# Patient Record
Sex: Male | Born: 1935
Health system: Southern US, Community
[De-identification: ages and names within clinical notes are randomized; demographics above are authoritative.]

## PROBLEM LIST (undated history)

## (undated) DIAGNOSIS — K219 Gastro-esophageal reflux disease without esophagitis: Secondary | ICD-10-CM

## (undated) DIAGNOSIS — I10 Essential (primary) hypertension: Secondary | ICD-10-CM

## (undated) DIAGNOSIS — N200 Calculus of kidney: Secondary | ICD-10-CM

## (undated) DIAGNOSIS — N184 Chronic kidney disease, stage 4 (severe): Secondary | ICD-10-CM

## (undated) DIAGNOSIS — D509 Iron deficiency anemia, unspecified: Secondary | ICD-10-CM

## (undated) DIAGNOSIS — E119 Type 2 diabetes mellitus without complications: Secondary | ICD-10-CM

## (undated) DIAGNOSIS — I2510 Atherosclerotic heart disease of native coronary artery without angina pectoris: Secondary | ICD-10-CM

## (undated) DIAGNOSIS — E785 Hyperlipidemia, unspecified: Secondary | ICD-10-CM

## (undated) HISTORY — PX: CORONARY ARTERY BYPASS GRAFT: SHX141

## (undated) HISTORY — DX: Iron deficiency anemia, unspecified: D50.9

## (undated) HISTORY — DX: Chronic kidney disease, stage 4 (severe): N18.4

## (undated) HISTORY — PX: CORONARY ANGIOPLASTY WITH STENT PLACEMENT: SHX49

## (undated) HISTORY — DX: Gastro-esophageal reflux disease without esophagitis: K21.9

## (undated) HISTORY — DX: Calculus of kidney: N20.0

## (undated) HISTORY — PX: APPENDECTOMY: SHX54

## (undated) HISTORY — DX: Atherosclerotic heart disease of native coronary artery without angina pectoris: I25.10

## (undated) HISTORY — DX: Essential (primary) hypertension: I10

---

## 2003-10-11 ENCOUNTER — Other Ambulatory Visit: Payer: Self-pay

## 2007-10-07 ENCOUNTER — Ambulatory Visit: Payer: Self-pay | Admitting: Internal Medicine

## 2010-08-11 ENCOUNTER — Ambulatory Visit: Payer: Self-pay

## 2014-07-28 ENCOUNTER — Ambulatory Visit: Payer: Self-pay | Admitting: Internal Medicine

## 2015-01-19 DIAGNOSIS — Z951 Presence of aortocoronary bypass graft: Secondary | ICD-10-CM | POA: Diagnosis not present

## 2015-01-19 DIAGNOSIS — N179 Acute kidney failure, unspecified: Secondary | ICD-10-CM | POA: Diagnosis not present

## 2015-01-19 DIAGNOSIS — R109 Unspecified abdominal pain: Secondary | ICD-10-CM | POA: Diagnosis not present

## 2015-01-19 DIAGNOSIS — D649 Anemia, unspecified: Secondary | ICD-10-CM | POA: Diagnosis not present

## 2015-01-19 DIAGNOSIS — I129 Hypertensive chronic kidney disease with stage 1 through stage 4 chronic kidney disease, or unspecified chronic kidney disease: Secondary | ICD-10-CM | POA: Diagnosis not present

## 2015-01-19 DIAGNOSIS — Z791 Long term (current) use of non-steroidal anti-inflammatories (NSAID): Secondary | ICD-10-CM | POA: Diagnosis not present

## 2015-01-19 DIAGNOSIS — I071 Rheumatic tricuspid insufficiency: Secondary | ICD-10-CM | POA: Diagnosis not present

## 2015-01-19 DIAGNOSIS — N390 Urinary tract infection, site not specified: Secondary | ICD-10-CM | POA: Diagnosis not present

## 2015-01-19 DIAGNOSIS — R7989 Other specified abnormal findings of blood chemistry: Secondary | ICD-10-CM | POA: Diagnosis not present

## 2015-01-19 DIAGNOSIS — I517 Cardiomegaly: Secondary | ICD-10-CM | POA: Diagnosis not present

## 2015-01-19 DIAGNOSIS — I272 Other secondary pulmonary hypertension: Secondary | ICD-10-CM | POA: Diagnosis not present

## 2015-01-19 DIAGNOSIS — I5030 Unspecified diastolic (congestive) heart failure: Secondary | ICD-10-CM | POA: Diagnosis not present

## 2015-01-19 DIAGNOSIS — I519 Heart disease, unspecified: Secondary | ICD-10-CM | POA: Diagnosis not present

## 2015-01-19 DIAGNOSIS — R001 Bradycardia, unspecified: Secondary | ICD-10-CM | POA: Diagnosis not present

## 2015-01-19 DIAGNOSIS — D62 Acute posthemorrhagic anemia: Secondary | ICD-10-CM | POA: Diagnosis not present

## 2015-01-19 DIAGNOSIS — R079 Chest pain, unspecified: Secondary | ICD-10-CM | POA: Diagnosis not present

## 2015-01-19 DIAGNOSIS — E119 Type 2 diabetes mellitus without complications: Secondary | ICD-10-CM | POA: Diagnosis not present

## 2015-01-19 DIAGNOSIS — J90 Pleural effusion, not elsewhere classified: Secondary | ICD-10-CM | POA: Diagnosis not present

## 2015-01-19 DIAGNOSIS — R1013 Epigastric pain: Secondary | ICD-10-CM | POA: Diagnosis not present

## 2015-01-19 DIAGNOSIS — K269 Duodenal ulcer, unspecified as acute or chronic, without hemorrhage or perforation: Secondary | ICD-10-CM | POA: Diagnosis not present

## 2015-01-19 DIAGNOSIS — K267 Chronic duodenal ulcer without hemorrhage or perforation: Secondary | ICD-10-CM | POA: Diagnosis not present

## 2015-01-19 DIAGNOSIS — I2510 Atherosclerotic heart disease of native coronary artery without angina pectoris: Secondary | ICD-10-CM | POA: Diagnosis not present

## 2015-01-19 DIAGNOSIS — R531 Weakness: Secondary | ICD-10-CM | POA: Diagnosis not present

## 2015-01-19 DIAGNOSIS — K921 Melena: Secondary | ICD-10-CM | POA: Diagnosis not present

## 2015-01-19 DIAGNOSIS — I214 Non-ST elevation (NSTEMI) myocardial infarction: Secondary | ICD-10-CM | POA: Diagnosis not present

## 2015-01-19 DIAGNOSIS — R2681 Unsteadiness on feet: Secondary | ICD-10-CM | POA: Diagnosis not present

## 2015-01-19 DIAGNOSIS — K922 Gastrointestinal hemorrhage, unspecified: Secondary | ICD-10-CM | POA: Diagnosis not present

## 2015-01-26 DIAGNOSIS — I252 Old myocardial infarction: Secondary | ICD-10-CM | POA: Diagnosis not present

## 2015-01-26 DIAGNOSIS — R531 Weakness: Secondary | ICD-10-CM | POA: Diagnosis not present

## 2015-01-26 DIAGNOSIS — I2510 Atherosclerotic heart disease of native coronary artery without angina pectoris: Secondary | ICD-10-CM | POA: Diagnosis not present

## 2015-01-26 DIAGNOSIS — I10 Essential (primary) hypertension: Secondary | ICD-10-CM | POA: Diagnosis not present

## 2015-01-26 DIAGNOSIS — E119 Type 2 diabetes mellitus without complications: Secondary | ICD-10-CM | POA: Diagnosis not present

## 2015-01-26 DIAGNOSIS — Z87440 Personal history of urinary (tract) infections: Secondary | ICD-10-CM | POA: Diagnosis not present

## 2015-01-26 DIAGNOSIS — Z7982 Long term (current) use of aspirin: Secondary | ICD-10-CM | POA: Diagnosis not present

## 2015-01-26 DIAGNOSIS — D500 Iron deficiency anemia secondary to blood loss (chronic): Secondary | ICD-10-CM | POA: Diagnosis not present

## 2015-02-04 DIAGNOSIS — I872 Venous insufficiency (chronic) (peripheral): Secondary | ICD-10-CM | POA: Diagnosis not present

## 2015-02-04 DIAGNOSIS — D649 Anemia, unspecified: Secondary | ICD-10-CM | POA: Diagnosis not present

## 2015-02-04 DIAGNOSIS — K279 Peptic ulcer, site unspecified, unspecified as acute or chronic, without hemorrhage or perforation: Secondary | ICD-10-CM | POA: Diagnosis not present

## 2015-02-04 DIAGNOSIS — I8310 Varicose veins of unspecified lower extremity with inflammation: Secondary | ICD-10-CM | POA: Diagnosis not present

## 2015-02-18 DIAGNOSIS — E119 Type 2 diabetes mellitus without complications: Secondary | ICD-10-CM | POA: Diagnosis not present

## 2015-02-18 DIAGNOSIS — I509 Heart failure, unspecified: Secondary | ICD-10-CM | POA: Diagnosis not present

## 2015-02-22 DIAGNOSIS — D649 Anemia, unspecified: Secondary | ICD-10-CM | POA: Diagnosis not present

## 2015-02-22 DIAGNOSIS — I872 Venous insufficiency (chronic) (peripheral): Secondary | ICD-10-CM | POA: Diagnosis not present

## 2015-02-22 DIAGNOSIS — I8310 Varicose veins of unspecified lower extremity with inflammation: Secondary | ICD-10-CM | POA: Diagnosis not present

## 2015-02-22 DIAGNOSIS — K279 Peptic ulcer, site unspecified, unspecified as acute or chronic, without hemorrhage or perforation: Secondary | ICD-10-CM | POA: Diagnosis not present

## 2015-03-28 DIAGNOSIS — I459 Conduction disorder, unspecified: Secondary | ICD-10-CM | POA: Diagnosis not present

## 2015-03-28 DIAGNOSIS — E119 Type 2 diabetes mellitus without complications: Secondary | ICD-10-CM | POA: Diagnosis not present

## 2015-03-28 DIAGNOSIS — I25810 Atherosclerosis of coronary artery bypass graft(s) without angina pectoris: Secondary | ICD-10-CM | POA: Diagnosis not present

## 2015-04-19 DIAGNOSIS — Z0000 Encounter for general adult medical examination without abnormal findings: Secondary | ICD-10-CM | POA: Diagnosis not present

## 2015-07-20 DIAGNOSIS — I25810 Atherosclerosis of coronary artery bypass graft(s) without angina pectoris: Secondary | ICD-10-CM | POA: Diagnosis not present

## 2015-07-20 DIAGNOSIS — R21 Rash and other nonspecific skin eruption: Secondary | ICD-10-CM | POA: Diagnosis not present

## 2015-07-20 DIAGNOSIS — B0223 Postherpetic polyneuropathy: Secondary | ICD-10-CM | POA: Diagnosis not present

## 2015-10-20 DIAGNOSIS — E119 Type 2 diabetes mellitus without complications: Secondary | ICD-10-CM | POA: Diagnosis not present

## 2015-10-20 DIAGNOSIS — I25810 Atherosclerosis of coronary artery bypass graft(s) without angina pectoris: Secondary | ICD-10-CM | POA: Diagnosis not present

## 2016-01-20 DIAGNOSIS — Z882 Allergy status to sulfonamides status: Secondary | ICD-10-CM | POA: Diagnosis not present

## 2016-01-20 DIAGNOSIS — E119 Type 2 diabetes mellitus without complications: Secondary | ICD-10-CM | POA: Diagnosis not present

## 2016-01-20 DIAGNOSIS — I25810 Atherosclerosis of coronary artery bypass graft(s) without angina pectoris: Secondary | ICD-10-CM | POA: Diagnosis not present

## 2016-04-18 DIAGNOSIS — M792 Neuralgia and neuritis, unspecified: Secondary | ICD-10-CM | POA: Diagnosis not present

## 2016-04-18 DIAGNOSIS — B029 Zoster without complications: Secondary | ICD-10-CM | POA: Diagnosis not present

## 2016-04-18 DIAGNOSIS — B0221 Postherpetic geniculate ganglionitis: Secondary | ICD-10-CM | POA: Diagnosis not present

## 2016-07-25 DIAGNOSIS — Z882 Allergy status to sulfonamides status: Secondary | ICD-10-CM | POA: Diagnosis not present

## 2016-07-25 DIAGNOSIS — I459 Conduction disorder, unspecified: Secondary | ICD-10-CM | POA: Diagnosis not present

## 2016-07-25 DIAGNOSIS — I25810 Atherosclerosis of coronary artery bypass graft(s) without angina pectoris: Secondary | ICD-10-CM | POA: Diagnosis not present

## 2016-07-25 DIAGNOSIS — E119 Type 2 diabetes mellitus without complications: Secondary | ICD-10-CM | POA: Diagnosis not present

## 2016-08-04 ENCOUNTER — Emergency Department: Payer: Commercial Managed Care - HMO

## 2016-08-04 ENCOUNTER — Encounter: Payer: Self-pay | Admitting: Emergency Medicine

## 2016-08-04 ENCOUNTER — Inpatient Hospital Stay
Admission: EM | Admit: 2016-08-04 | Discharge: 2016-08-07 | DRG: 871 | Disposition: A | Discharge status: Home / Self Care | Payer: Commercial Managed Care - HMO | Attending: Specialist | Admitting: Specialist

## 2016-08-04 DIAGNOSIS — Z951 Presence of aortocoronary bypass graft: Secondary | ICD-10-CM

## 2016-08-04 DIAGNOSIS — D6959 Other secondary thrombocytopenia: Secondary | ICD-10-CM | POA: Diagnosis not present

## 2016-08-04 DIAGNOSIS — E1122 Type 2 diabetes mellitus with diabetic chronic kidney disease: Secondary | ICD-10-CM | POA: Diagnosis present

## 2016-08-04 DIAGNOSIS — E860 Dehydration: Secondary | ICD-10-CM | POA: Diagnosis not present

## 2016-08-04 DIAGNOSIS — K219 Gastro-esophageal reflux disease without esophagitis: Secondary | ICD-10-CM | POA: Diagnosis present

## 2016-08-04 DIAGNOSIS — I129 Hypertensive chronic kidney disease with stage 1 through stage 4 chronic kidney disease, or unspecified chronic kidney disease: Secondary | ICD-10-CM | POA: Diagnosis present

## 2016-08-04 DIAGNOSIS — N289 Disorder of kidney and ureter, unspecified: Secondary | ICD-10-CM

## 2016-08-04 DIAGNOSIS — D649 Anemia, unspecified: Secondary | ICD-10-CM | POA: Diagnosis present

## 2016-08-04 DIAGNOSIS — N179 Acute kidney failure, unspecified: Secondary | ICD-10-CM | POA: Diagnosis not present

## 2016-08-04 DIAGNOSIS — Z87891 Personal history of nicotine dependence: Secondary | ICD-10-CM | POA: Diagnosis not present

## 2016-08-04 DIAGNOSIS — N400 Enlarged prostate without lower urinary tract symptoms: Secondary | ICD-10-CM | POA: Diagnosis present

## 2016-08-04 DIAGNOSIS — E119 Type 2 diabetes mellitus without complications: Secondary | ICD-10-CM

## 2016-08-04 DIAGNOSIS — R739 Hyperglycemia, unspecified: Secondary | ICD-10-CM

## 2016-08-04 DIAGNOSIS — Z7982 Long term (current) use of aspirin: Secondary | ICD-10-CM

## 2016-08-04 DIAGNOSIS — N189 Chronic kidney disease, unspecified: Secondary | ICD-10-CM | POA: Diagnosis present

## 2016-08-04 DIAGNOSIS — N390 Urinary tract infection, site not specified: Secondary | ICD-10-CM | POA: Diagnosis present

## 2016-08-04 DIAGNOSIS — A419 Sepsis, unspecified organism: Secondary | ICD-10-CM | POA: Diagnosis not present

## 2016-08-04 DIAGNOSIS — N170 Acute kidney failure with tubular necrosis: Secondary | ICD-10-CM | POA: Diagnosis not present

## 2016-08-04 DIAGNOSIS — Z79899 Other long term (current) drug therapy: Secondary | ICD-10-CM | POA: Diagnosis not present

## 2016-08-04 DIAGNOSIS — Z955 Presence of coronary angioplasty implant and graft: Secondary | ICD-10-CM | POA: Diagnosis not present

## 2016-08-04 DIAGNOSIS — E785 Hyperlipidemia, unspecified: Secondary | ICD-10-CM | POA: Diagnosis present

## 2016-08-04 DIAGNOSIS — R8281 Pyuria: Secondary | ICD-10-CM | POA: Diagnosis present

## 2016-08-04 DIAGNOSIS — R6889 Other general symptoms and signs: Secondary | ICD-10-CM | POA: Diagnosis not present

## 2016-08-04 DIAGNOSIS — W19XXXA Unspecified fall, initial encounter: Secondary | ICD-10-CM | POA: Diagnosis not present

## 2016-08-04 DIAGNOSIS — R509 Fever, unspecified: Secondary | ICD-10-CM | POA: Diagnosis not present

## 2016-08-04 HISTORY — DX: Type 2 diabetes mellitus without complications: E11.9

## 2016-08-04 HISTORY — DX: Hyperlipidemia, unspecified: E78.5

## 2016-08-04 LAB — BLOOD GAS, VENOUS
Acid-base deficit: 7.6 mmol/L — ABNORMAL HIGH (ref 0.0–2.0)
BICARBONATE: 17.1 meq/L — AB (ref 21.0–28.0)
O2 SAT: 82 %
PATIENT TEMPERATURE: 37
PO2 VEN: 49 mmHg — AB (ref 31.0–45.0)
pCO2, Ven: 31 mmHg — ABNORMAL LOW (ref 44.0–60.0)
pH, Ven: 7.35 (ref 7.320–7.430)

## 2016-08-04 LAB — HEPATIC FUNCTION PANEL
ALK PHOS: 78 U/L (ref 38–126)
ALT: 9 U/L — ABNORMAL LOW (ref 17–63)
AST: 28 U/L (ref 15–41)
Albumin: 3.5 g/dL (ref 3.5–5.0)
BILIRUBIN TOTAL: 0.2 mg/dL — AB (ref 0.3–1.2)
Total Protein: 6.5 g/dL (ref 6.5–8.1)

## 2016-08-04 LAB — URINALYSIS COMPLETE WITH MICROSCOPIC (ARMC ONLY)
BILIRUBIN URINE: NEGATIVE
Glucose, UA: NEGATIVE mg/dL
KETONES UR: NEGATIVE mg/dL
NITRITE: NEGATIVE
PH: 5 (ref 5.0–8.0)
PROTEIN: 30 mg/dL — AB
Specific Gravity, Urine: 1.008 (ref 1.005–1.030)

## 2016-08-04 LAB — BASIC METABOLIC PANEL
ANION GAP: 10 (ref 5–15)
BUN: 49 mg/dL — AB (ref 6–20)
CHLORIDE: 109 mmol/L (ref 101–111)
CO2: 17 mmol/L — ABNORMAL LOW (ref 22–32)
Calcium: 8.5 mg/dL — ABNORMAL LOW (ref 8.9–10.3)
Creatinine, Ser: 3.45 mg/dL — ABNORMAL HIGH (ref 0.61–1.24)
GFR, EST AFRICAN AMERICAN: 17 mL/min — AB (ref >60)
GFR, EST NON AFRICAN AMERICAN: 14 mL/min — AB (ref >60)
Glucose, Bld: 291 mg/dL — ABNORMAL HIGH (ref 65–99)
POTASSIUM: 3.9 mmol/L (ref 3.5–5.1)
SODIUM: 136 mmol/L (ref 135–145)

## 2016-08-04 LAB — LACTIC ACID, PLASMA
LACTIC ACID, VENOUS: 4.4 mmol/L — AB (ref 0.5–1.9)
Lactic Acid, Venous: 2.5 mmol/L (ref 0.5–1.9)

## 2016-08-04 LAB — GLUCOSE, CAPILLARY: Glucose-Capillary: 328 mg/dL — ABNORMAL HIGH (ref 65–99)

## 2016-08-04 LAB — CBC
HEMATOCRIT: 26.4 % — AB (ref 40.0–52.0)
Hemoglobin: 8.8 g/dL — ABNORMAL LOW (ref 13.0–18.0)
MCH: 27.8 pg (ref 26.0–34.0)
MCHC: 33.5 g/dL (ref 32.0–36.0)
MCV: 83 fL (ref 80.0–100.0)
Platelets: 120 10*3/uL — ABNORMAL LOW (ref 150–440)
RBC: 3.18 MIL/uL — AB (ref 4.40–5.90)
RDW: 15.1 % — AB (ref 11.5–14.5)
WBC: 6.2 10*3/uL (ref 3.8–10.6)

## 2016-08-04 MED ORDER — ACETAMINOPHEN 325 MG PO TABS
ORAL_TABLET | ORAL | Status: AC
  Filled 2016-08-04: qty 2

## 2016-08-04 MED ORDER — SODIUM CHLORIDE 0.9 % IV BOLUS (SEPSIS)
250.0000 mL | Freq: Once | INTRAVENOUS | Status: AC
  Administered 2016-08-04: 250 mL via INTRAVENOUS

## 2016-08-04 MED ORDER — SODIUM CHLORIDE 0.9 % IV BOLUS (SEPSIS)
1000.0000 mL | Freq: Once | INTRAVENOUS | Status: AC
  Administered 2016-08-04: 1000 mL via INTRAVENOUS

## 2016-08-04 MED ORDER — CEFTRIAXONE SODIUM 2 G IJ SOLR
2.0000 g | Freq: Once | INTRAMUSCULAR | Status: AC
  Administered 2016-08-04: 2 g via INTRAVENOUS
  Filled 2016-08-04: qty 2

## 2016-08-04 MED ORDER — ACETAMINOPHEN 325 MG PO TABS
650.0000 mg | ORAL_TABLET | Freq: Once | ORAL | Status: AC | PRN
  Administered 2016-08-04: 650 mg via ORAL

## 2016-08-04 MED ORDER — DEXTROSE 5 % IV SOLN
2.0000 g | INTRAVENOUS | Status: DC
  Administered 2016-08-05 – 2016-08-06 (×2): 2 g via INTRAVENOUS
  Filled 2016-08-04 (×3): qty 2

## 2016-08-04 NOTE — ED Notes (Signed)
Pt denies needs at this time, family at bedside. Pt informed will recheck temp in an hour. Call bell at side. Pt placed on cardiac monitor.

## 2016-08-04 NOTE — ED Notes (Signed)
Spoke with dr. Mariea Clonts regarding pt's elevated temp, urine results, wbc results and chief complaint. No new orders received, md states will see pt next and evaluate.

## 2016-08-04 NOTE — ED Notes (Signed)
Pt and family updated on admission process. Pt denies further needs at this time. Family and pt verbalize understanding regarding admission process. Skin cooler, warm and dry. resps unlabored.

## 2016-08-04 NOTE — ED Notes (Signed)
Critical lactic acid called from lab of 4.4, dr. Mariea Clonts notified. No new orders received.

## 2016-08-04 NOTE — ED Notes (Signed)
Pt returned from ct. Pt updated on care plan. Call bell at right side, temperature improved. Pt denies further needs.

## 2016-08-04 NOTE — ED Notes (Signed)
Report from denia, rn.

## 2016-08-04 NOTE — H&P (Signed)
Jay Mathis at [facility]: Jay Mathis    MR#:  [id number]  DATE OF BIRTH:  1924-01-19  DATE OF ADMISSION:  08/04/2016  PRIMARY CARE PHYSICIAN: Jay Athens, MD   REQUESTING/REFERRING PHYSICIAN: Mariea Mathis, M.D.  CHIEF COMPLAINT:   Chief Complaint  Patient presents with  . Near Syncope    HISTORY OF PRESENT ILLNESS:  Jay Mathis  is a 82 y.o. male who presents with Weakness and near syncope. Patient states that he was using the bathroom earlier today and when he went to get up he fell and was weak. He states that he has had some low abdominal tenderness for the past couple of days. His son is present with him in the ED and states that this is very similar presentation to UTI that he's had in the past. Here in the ED he was found to have likely UTI on UA. He does state that he has had some chills recently. Hospitals were called for admission  PAST MEDICAL HISTORY:   Past Medical History:  Diagnosis Date  . Diabetes mellitus without complication ([place])   . Hyperlipidemia     PAST SURGICAL HISTORY:   Past Surgical History:  Procedure Laterality Date  . CORONARY ANGIOPLASTY WITH STENT PLACEMENT    . CORONARY ARTERY BYPASS GRAFT      SOCIAL HISTORY:   Social History  Substance Use Topics  . Smoking status: Former Research scientist (life sciences)  . Smokeless tobacco: Never Used  . Alcohol use Jay    FAMILY HISTORY:  History reviewed. Jay pertinent family history.  DRUG ALLERGIES:  Jay Known Allergies  MEDICATIONS AT HOME:   Prior to Admission medications   Medication Sig Start Date End Date Taking? Authorizing Provider  aspirin EC 81 MG tablet Take 81 mg by mouth daily.   Yes Historical Provider, MD  atorvastatin (LIPITOR) 10 MG tablet Take 10 mg by mouth at bedtime.  07/31/16  Yes Historical Provider, MD  doxazosin (CARDURA) 8 MG tablet Take 8 mg by mouth daily. 07/31/16  Yes Historical Provider, MD  furosemide (LASIX) 20 MG tablet Take 20  mg by mouth daily. 07/16/16  Yes Historical Provider, MD  glimepiride (AMARYL) 2 MG tablet Take 4 mg by mouth every evening.  07/31/16  Yes Historical Provider, MD  pantoprazole (PROTONIX) 40 MG tablet Take 40 mg by mouth 2 (two) times daily. 07/09/16  Yes Historical Provider, MD    REVIEW OF SYSTEMS:  Review of Systems  Constitutional: Positive for chills. Negative for fever, malaise/fatigue and weight loss.  HENT: Negative for ear pain, hearing loss and tinnitus.   Eyes: Negative for blurred vision, double vision, pain and redness.  Respiratory: Negative for cough, hemoptysis and shortness of breath.   Cardiovascular: Negative for chest pain, palpitations, orthopnea and leg swelling.  Gastrointestinal: Negative for abdominal pain, constipation, diarrhea, nausea and vomiting.  Genitourinary: Negative for dysuria, frequency and hematuria.       Suprapubic tenderness  Musculoskeletal: Negative for back pain, joint pain and neck pain.  Skin:       Jay acne, rash, or lesions  Neurological: Positive for weakness. Negative for dizziness, tremors and focal weakness.  Endo/Heme/Allergies: Negative for polydipsia. Does not bruise/bleed easily.  Psychiatric/Behavioral: Negative for depression. The patient is not nervous/anxious and does not have insomnia.      VITAL SIGNS:   Vitals:   08/04/16 2156 08/04/16 2200 08/04/16 2230 08/04/16 2300  BP:  (!) 134/58 126/63 (!) 128/56  Pulse:  85 70 70  Resp:  18 15 17   Temp: 98.5 F (36.9 C)     TempSrc: Oral     SpO2:  96% 96% 96%   Wt Readings from Last 3 Encounters:  Jay data found for Wt    PHYSICAL EXAMINATION:  Physical Exam  Vitals reviewed. Constitutional: He is oriented to person, place, and time. He appears well-developed and well-nourished. Jay distress.  HENT:  Head: Normocephalic and atraumatic.  Mouth/Throat: Oropharynx is clear and moist.  Eyes: Conjunctivae and EOM are normal. Pupils are equal, round, and reactive to light. Jay  scleral icterus.  Neck: Normal range of motion. Neck supple. Jay JVD present. Jay thyromegaly present.  Cardiovascular: Normal rate, regular rhythm and intact distal pulses.  Exam reveals Jay gallop and Jay friction rub.   Murmur (2/6 systolic murmur) heard. Respiratory: Effort normal and breath sounds normal. Jay respiratory distress. He has Jay wheezes. He has Jay rales.  GI: Soft. Bowel sounds are normal. He exhibits Jay distension. There is Jay tenderness.  Musculoskeletal: Normal range of motion. He exhibits Jay edema.  Jay arthritis, Jay gout  Lymphadenopathy:    He has Jay cervical adenopathy.  Neurological: He is alert and oriented to person, place, and time. Jay cranial nerve deficit.  Jay dysarthria, Jay aphasia  Skin: Skin is warm and dry. Jay rash noted. Jay erythema.  Psychiatric: He has a normal mood and affect. His behavior is normal. Judgment and thought content normal.    LABORATORY PANEL:   CBC  Recent Labs Lab 08/04/16 1832  WBC 6.2  HGB 8.8*  HCT 26.4*  PLT 120*   ------------------------------------------------------------------------------------------------------------------  Chemistries   Recent Labs Lab 08/04/16 1832  NA 136  K 3.9  CL 109  CO2 17*  GLUCOSE 291*  BUN 49*  CREATININE 3.45*  CALCIUM 8.5*  AST 28  ALT 9*  ALKPHOS 78  BILITOT 0.2*   ------------------------------------------------------------------------------------------------------------------  Cardiac Enzymes Jay results for input(s): TROPONINI in the last 168 hours. ------------------------------------------------------------------------------------------------------------------  RADIOLOGY:  Ct Renal Stone Study  Result Date: 08/04/2016 CLINICAL DATA:  Sepsis.  Fever.  The patient fell today in his room. EXAM: CT ABDOMEN AND PELVIS WITHOUT CONTRAST TECHNIQUE: Multidetector CT imaging of the abdomen and pelvis was performed following the standard protocol without IV contrast. COMPARISON:   None. FINDINGS: Lower chest:  Jay acute findings. Hepatobiliary: Normal. Pancreas: Jay mass or inflammatory process identified on this un-enhanced exam. Spleen: Within normal limits in size. Adrenals/Urinary Tract: Normal adrenal glands. Moderate bilateral renal atrophy. 13 mm hyperdense cyst on the lateral aspect of the mid left kidney. Moderate bilateral hydronephrosis. Both ureters are dilated to the bladder. Jay discrete stone or mass in the bladder. Slight thickening of the bladder wall with several bladder diverticula. Stomach/Bowel: Left inguinal hernia containing several loops of small bowel. Jay evidence of obstruction of the bowel. Scattered diverticula in the distal colon. Vascular/Lymphatic: Extensive aortic atherosclerosis. Jay adenopathy. Reproductive: Slight prominence of the prostate gland. Other: Jay free air or free fluid. Benign appearing lipoma deep to the biceps femoris muscle in the proximal left thigh. The Musculoskeletal:  Jay acute abnormality. IMPRESSION: Or right inguinal hernia containing multiple loops of small bowel. Jay evidence of obstruction. Probable chronic bilateral hydronephrosis, probably due to chronic bladder outlet obstruction. Multiple bladder diverticula. Electronically Signed   By: Lorriane Shire M.D.   On: 08/04/2016 20:55    EKG:   Orders placed or performed during the hospital encounter of 08/04/16  . ED EKG  .  ED EKG    IMPRESSION AND PLAN:  Principal Problem:   Sepsis ([place]) - IV antibiotics started in the ED and continued on admission. Patient's lactic acid was elevated, improved some after fluids. We will continue to trend it with further fluid administration until it normalizes. Patient is hemodynamically stable. Cultures sent from the ED. Active Problems:   UTI (lower urinary tract infection) - IV antibiotics and cultures as above   Acute on chronic kidney failure (HCC) - baseline creatinine seems to be around 2 based on chart review, it is now above 3.  Suspect acute component due to possibly hydration and certainly his UTI. Hydrate with fluids and monitor for improvement, avoid nephrotoxins   Diabetes (HCC) - sliding scale insulin with corresponding glucose checks and carb modified diet   HLD (hyperlipidemia) - continue home meds   GERD (gastroesophageal reflux disease) - home dose PPI  All the records are reviewed and case discussed with ED provider. Management plans discussed with the patient and/or family.  DVT PROPHYLAXIS: SubQ heparin  GI PROPHYLAXIS: PPI  ADMISSION STATUS: Inpatient  CODE STATUS: Full Code Status History    This patient does not have a recorded code status. Please follow your organizational policy for patients in this situation.    Advance Directive Documentation   Flowsheet Row Most Recent Value  Type of Advance Directive  Healthcare Power of Attorney  Pre-existing out of facility DNR order (yellow form or pink MOST form)  Jay data  "MOST" Form in Place?  Jay data      TOTAL TIME TAKING CARE OF THIS PATIENT: 45 minutes.    Aubrei Bouchie [place] 08/04/2016, 11:21 PM  [facility]  Office  [phone]  CC: Primary care physician; Jay Athens, MD

## 2016-08-04 NOTE — Progress Notes (Signed)
Pharmacy Antibiotic Note  Jay Mathis is a 82 y.o. male admitted on 08/04/2016 with UTI.  Pharmacy has been consulted for ceftriaxone dosing.  Plan: Ceftriaxone 2 grams q 24 hours ordered.     Temp (24hrs), Avg:99.9 F (37.7 C), Min:98.5 F (36.9 C), Max:101.6 F (38.7 C)   Recent Labs Lab 08/04/16 1832  WBC 6.2  CREATININE 3.45*  LATICACIDVEN 4.4*    CrCl cannot be calculated (Unknown ideal weight.).    No Known Allergies  Antimicrobials this admission: ceftriaxone  >>    >>   Dose adjustments this admission:   Microbiology results: 8/26 BCx: pending   8/26 UA: LE(+) NO2(-) WBC TNTC  Thank you for allowing pharmacy to be a part of this patient's care.  Shaynah Hund S 08/04/2016 11:06 PM

## 2016-08-04 NOTE — ED Notes (Signed)
Lactic acid of 2.5 called from lab. Dr. Mariea Clonts notified. No new orders received.

## 2016-08-04 NOTE — ED Notes (Signed)
Report given to April, RN

## 2016-08-04 NOTE — ED Notes (Signed)
Pt resting, denies needs. resps unlabored. Skin warm and dry.

## 2016-08-04 NOTE — ED Triage Notes (Signed)
Per EMS patient was walking to the bathroom and slid to the ground due to weakness and now patient co knee pain from that fall.  Pt denies LOC.  Pt has hx of cardiac stents and CABG.  VS of most concern is oral temp of 102.8.  Patient is AOx4.

## 2016-08-04 NOTE — ED Provider Notes (Signed)
[facility] Emergency Department Provider Note  ____________________________________________  Time seen: Approximately 8:05 PM  I have reviewed the triage vital signs and the nursing notes.   HISTORY  Chief Complaint Near Syncope    HPI Jay Mathis is a 82 y.o. male who lives independently with a history of renal colic, recurrent UTI,DM 2, presenting with shaking chills and generalized weakness. The patient reports that he was in his usual state of health this morning. This afternoon he began to have general is weakness with shaking chills and when he was ambulating to the bathroom he became so weak that he collapsed down to the floor. He denies any syncope and did not lose consciousness. When he fell, he reports hurting his knees but is not having pain at this time. The patient reports some mild urinary burning for the past 3 days and states he has been passing kidney stones, "one of which was barbed and cut my penis.". These kidney stones or similar to previous kidney stones he has had, but he did not seek medical attention for them. He denies any nausea or vomiting, known fever, diarrhea, abdominal pain. No cough or cold symptoms or shortness of breath. No chest pain.   Past Medical History:  Diagnosis Date  . Diabetes mellitus without complication ([place])   . Hyperlipidemia     There are no active problems to display for this patient.   Past Surgical History:  Procedure Laterality Date  . CORONARY ANGIOPLASTY WITH STENT PLACEMENT    . CORONARY ARTERY BYPASS GRAFT        Allergies Review of patient's allergies indicates no known allergies.  History reviewed. No pertinent family history.  Social History Social History  Substance Use Topics  . Smoking status: Former Research scientist (life sciences)  . Smokeless tobacco: Never Used  . Alcohol use No    Review of Systems Constitutional: Positive fever and chills. Positive generalized weakness. Eyes: No visual  changes. No eye discharge. ENT: No sore throat. No congestion or rhinorrhea. Cardiovascular: Denies chest pain. Denies palpitations. Respiratory: Denies shortness of breath.  No cough. Gastrointestinal: No abdominal pain.  No nausea, no vomiting.  No diarrhea.  No constipation. Genitourinary: Positive for dysuria. Positive for chronic inflammation of the distal glans which is unchanged. Musculoskeletal: Negative for back pain. No flank pain. Skin: Negative for rash. Neurological: Negative for headaches. No focal numbness, tingling or weakness.   10-point ROS otherwise negative.  ____________________________________________   PHYSICAL EXAM:  VITAL SIGNS: ED Triage Vitals [08/04/16 1822]  Enc Vitals Group     BP (!) 154/56     Pulse Rate 97     Resp 18     Temp (!) 101.6 F (38.7 C)     Temp Source Oral     SpO2 97 %     Weight      Height      Head Circumference      Peak Flow      Pain Score      Pain Loc      Pain Edu?      Excl. in [place]?     Constitutional: Alert and oriented. Chronically ill appearing but nontoxic. Positive diaphoresis with sweat present on the upper lip.Marland Kitchen Answers questions appropriately. Eyes: Conjunctivae are normal.  EOMI. No scleral icterus. No eye discharge. Head: Atraumatic. Nose: No congestion/rhinnorhea. Mouth/Throat: Mucous membranes are dry.  Neck: No stridor.  Supple.  No JVD. No meningismus. Cardiovascular: Normal rate, regular rhythm. No murmurs, rubs  or gallops.  Respiratory: Normal respiratory effort.  No accessory muscle use or retractions. Lungs CTAB.  No wheezes, rales or ronchi. Gastrointestinal: Soft, mildly distended with minimal tenderness to palpation in the suprapubic region.  No guarding or rebound.  No peritoneal signs. Genitourinary: Dried blood is present at the meatus. The patient has some inflammation of the posterior glans without any skin changes. Musculoskeletal: No LE edema. No ttp in the calves or palpable cords.   Negative Homan's sign. Neurologic:  A&Ox3.  Speech is clear.  Face and smile are symmetric.  EOMI.  Moves all extremities well. Skin:  Skin is warm, dry and intact. No rash noted. Psychiatric: Mood and affect are normal. Speech and behavior are normal.  Normal judgement.  ____________________________________________   LABS (all labs ordered are listed, but only abnormal results are displayed)  Labs Reviewed  BASIC METABOLIC PANEL - Abnormal; Notable for the following:       Result Value   CO2 17 (*)    Glucose, Bld 291 (*)    BUN 49 (*)    Creatinine, Ser 3.45 (*)    Calcium 8.5 (*)    GFR calc non Af Amer 14 (*)    GFR calc Af Amer 17 (*)    All other components within normal limits  CBC - Abnormal; Notable for the following:    RBC 3.18 (*)    Hemoglobin 8.8 (*)    HCT 26.4 (*)    RDW 15.1 (*)    Platelets 120 (*)    All other components within normal limits  URINALYSIS COMPLETEWITH MICROSCOPIC ([facility] ONLY) - Abnormal; Notable for the following:    Color, Urine YELLOW (*)    APPearance CLOUDY (*)    Hgb urine dipstick 3+ (*)    Protein, ur 30 (*)    Leukocytes, UA 3+ (*)    Bacteria, UA MANY (*)    Squamous Epithelial / LPF 0-5 (*)    All other components within normal limits  GLUCOSE, CAPILLARY - Abnormal; Notable for the following:    Glucose-Capillary 328 (*)    All other components within normal limits  LACTIC ACID, PLASMA - Abnormal; Notable for the following:    Lactic Acid, Venous 4.4 (*)    All other components within normal limits  BLOOD GAS, VENOUS - Abnormal; Notable for the following:    pCO2, Ven 31 (*)    pO2, Ven 49.0 (*)    Bicarbonate 17.1 (*)    Acid-base deficit 7.6 (*)    All other components within normal limits  HEPATIC FUNCTION PANEL - Abnormal; Notable for the following:    ALT 9 (*)    Total Bilirubin 0.2 (*)    Bilirubin, Direct <0.1 (*)    All other components within normal limits  CULTURE, BLOOD (ROUTINE X 2)  CULTURE, BLOOD  (ROUTINE X 2)  URINE CULTURE  LACTIC ACID, PLASMA  CBG MONITORING, ED   ____________________________________________  EKG  ED ECG REPORT I, Eula Listen, the attending physician, personally viewed and interpreted this ECG.   Date: 08/04/2016  EKG Time: 1839  Rate: 92  Rhythm: normal sinus rhythm  Axis: Leftward  Intervals:none  ST&T Change: No ST elevation. No ischemic changes. Poor baseline tracing which may limit EKG interpretation.  ____________________________________________  RADIOLOGY  Ct Renal Stone Study  Result Date: 08/04/2016 CLINICAL DATA:  Sepsis.  Fever.  The patient fell today in his room. EXAM: CT ABDOMEN AND PELVIS WITHOUT CONTRAST TECHNIQUE: Multidetector CT imaging of  the abdomen and pelvis was performed following the standard protocol without IV contrast. COMPARISON:  None. FINDINGS: Lower chest:  No acute findings. Hepatobiliary: Normal. Pancreas: No mass or inflammatory process identified on this un-enhanced exam. Spleen: Within normal limits in size. Adrenals/Urinary Tract: Normal adrenal glands. Moderate bilateral renal atrophy. 13 mm hyperdense cyst on the lateral aspect of the mid left kidney. Moderate bilateral hydronephrosis. Both ureters are dilated to the bladder. No discrete stone or mass in the bladder. Slight thickening of the bladder wall with several bladder diverticula. Stomach/Bowel: Left inguinal hernia containing several loops of small bowel. No evidence of obstruction of the bowel. Scattered diverticula in the distal colon. Vascular/Lymphatic: Extensive aortic atherosclerosis. No adenopathy. Reproductive: Slight prominence of the prostate gland. Other: No free air or free fluid. Benign appearing lipoma deep to the biceps femoris muscle in the proximal left thigh. The Musculoskeletal:  No acute abnormality. IMPRESSION: Or right inguinal hernia containing multiple loops of small bowel. No evidence of obstruction. Probable chronic bilateral  hydronephrosis, probably due to chronic bladder outlet obstruction. Multiple bladder diverticula. Electronically Signed   By: Lorriane Shire M.D.   On: 08/04/2016 20:55    ____________________________________________   PROCEDURES  Procedure(s) performed: None  Procedures  Critical Care performed: Yes ____________________________________________   INITIAL IMPRESSION / ASSESSMENT AND PLAN / ED COURSE  Pertinent labs & imaging results that were available during my care of the patient were reviewed by me and considered in my medical decision making (see chart for details).  82 y.o. male with a history of recurrent kidney stones and UTIs presenting with 3 days of "passing a kidney stone", with generalized weakness and chills today. The patient is diaphoretic and I'm concerned that he has sepsis. He may have a urinary tract infection, pyelonephritis, or an infected stone. We'll get a CT scan to evaluate for stone. Today, he has laboratory studies and I do not have any baseline for him as he has no previous visits in our system. He his lab work shows a urinary tract infection, acute renal insufficiency with a creatinine of 3.45, hyperglycemia without DKA, and anemia with a hematocrit of 26.4.  A code sepsis has been initiated and the patient will receive immediate antibiotics and fluid resuscitation based on his weight. We'll get a CT scan to evaluate as described above. The patient will require admission to the hospital.  ----------------------------------------- 9:37 PM on 08/04/2016 -----------------------------------------  The patient's CT scan does not show an infected renal stone. At this time, the patient has responded to his antipyretics and is afebrile. His vital signs remained stable. He has been treated with intravenous fluids as well as immediate antibiotics. I'll plan to admit the patient to the hospital for further evaluation and treatment.    ____________________________________________  FINAL CLINICAL IMPRESSION(S) / ED DIAGNOSES  Final diagnoses:  Sepsis ([place])  Sepsis, due to unspecified organism [facility])  Renal insufficiency  UTI (lower urinary tract infection)  Hyperglycemia    Clinical Course   CRITICAL CARE Performed by: Eula Listen   Total critical care time: 40 minutes  Critical care time was exclusive of separately billable procedures and treating other patients.  Critical care was necessary to treat or prevent imminent or life-threatening deterioration.  Critical care was time spent personally by me on the following activities: development of treatment plan with patient and/or surrogate as well as nursing, discussions with consultants, evaluation of patient's response to treatment, examination of patient, obtaining history from patient or surrogate, ordering and performing  treatments and interventions, ordering and review of laboratory studies, ordering and review of radiographic studies, pulse oximetry and re-evaluation of patient's condition.    NEW MEDICATIONS STARTED DURING THIS VISIT:  New Prescriptions   No medications on file      Eula Listen, MD 08/04/16 2137

## 2016-08-05 LAB — BASIC METABOLIC PANEL
Anion gap: 6 (ref 5–15)
BUN: 49 mg/dL — AB (ref 6–20)
CHLORIDE: 112 mmol/L — AB (ref 101–111)
CO2: 18 mmol/L — AB (ref 22–32)
Calcium: 8.1 mg/dL — ABNORMAL LOW (ref 8.9–10.3)
Creatinine, Ser: 3.13 mg/dL — ABNORMAL HIGH (ref 0.61–1.24)
GFR calc Af Amer: 19 mL/min — ABNORMAL LOW (ref >60)
GFR calc non Af Amer: 16 mL/min — ABNORMAL LOW (ref >60)
GLUCOSE: 147 mg/dL — AB (ref 65–99)
POTASSIUM: 3.9 mmol/L (ref 3.5–5.1)
Sodium: 136 mmol/L (ref 135–145)

## 2016-08-05 LAB — CBC
HEMATOCRIT: 25.9 % — AB (ref 40.0–52.0)
Hemoglobin: 8.5 g/dL — ABNORMAL LOW (ref 13.0–18.0)
MCH: 27 pg (ref 26.0–34.0)
MCHC: 32.9 g/dL (ref 32.0–36.0)
MCV: 82 fL (ref 80.0–100.0)
Platelets: 118 10*3/uL — ABNORMAL LOW (ref 150–440)
RBC: 3.16 MIL/uL — ABNORMAL LOW (ref 4.40–5.90)
RDW: 15 % — AB (ref 11.5–14.5)
WBC: 15.8 10*3/uL — AB (ref 3.8–10.6)

## 2016-08-05 LAB — LACTIC ACID, PLASMA: Lactic Acid, Venous: 2 mmol/L (ref 0.5–1.9)

## 2016-08-05 LAB — HEMOGLOBIN A1C: HEMOGLOBIN A1C: 7.4 % — AB (ref 4.0–6.0)

## 2016-08-05 LAB — GLUCOSE, CAPILLARY
GLUCOSE-CAPILLARY: 100 mg/dL — AB (ref 65–99)
Glucose-Capillary: 81 mg/dL (ref 65–99)
Glucose-Capillary: 213 mg/dL — ABNORMAL HIGH (ref 65–99)
Glucose-Capillary: 102 mg/dL — ABNORMAL HIGH (ref 65–99)

## 2016-08-05 MED ORDER — ONDANSETRON HCL 4 MG/2ML IJ SOLN: 4.0000 mg | Freq: Four times a day (QID) | INTRAMUSCULAR | Status: DC | PRN

## 2016-08-05 MED ORDER — HEPARIN SODIUM (PORCINE) 5000 UNIT/ML IJ SOLN
5000.0000 [IU] | Freq: Three times a day (TID) | INTRAMUSCULAR | Status: DC
  Administered 2016-08-05 – 2016-08-07 (×6): 5000 [IU] via SUBCUTANEOUS
  Filled 2016-08-05 (×6): qty 1

## 2016-08-05 MED ORDER — SODIUM CHLORIDE 0.9 % IV SOLN
INTRAVENOUS | Status: AC
  Administered 2016-08-05: 01:00:00 via INTRAVENOUS

## 2016-08-05 MED ORDER — ATORVASTATIN CALCIUM 20 MG PO TABS
10.0000 mg | ORAL_TABLET | Freq: Every day | ORAL | Status: DC
  Administered 2016-08-05 – 2016-08-06 (×2): 10 mg via ORAL
  Filled 2016-08-05 (×2): qty 1

## 2016-08-05 MED ORDER — ASPIRIN EC 81 MG PO TBEC
81.0000 mg | DELAYED_RELEASE_TABLET | Freq: Every day | ORAL | Status: DC
  Administered 2016-08-05 – 2016-08-07 (×3): 81 mg via ORAL
  Filled 2016-08-05 (×3): qty 1

## 2016-08-05 MED ORDER — FUROSEMIDE 20 MG PO TABS
20.0000 mg | ORAL_TABLET | Freq: Every day | ORAL | Status: DC
  Administered 2016-08-05: 20 mg via ORAL
  Filled 2016-08-05: qty 1

## 2016-08-05 MED ORDER — ONDANSETRON HCL 4 MG PO TABS: 4.0000 mg | ORAL_TABLET | Freq: Four times a day (QID) | ORAL | Status: DC | PRN

## 2016-08-05 MED ORDER — INSULIN ASPART 100 UNIT/ML ~~LOC~~ SOLN
0.0000 [IU] | Freq: Three times a day (TID) | SUBCUTANEOUS | Status: DC
Start: 2016-08-05 — End: 2016-08-07
  Administered 2016-08-05: 3 [IU] via SUBCUTANEOUS
  Administered 2016-08-06: 2 [IU] via SUBCUTANEOUS
  Filled 2016-08-05: qty 2
  Filled 2016-08-05: qty 1

## 2016-08-05 MED ORDER — ACETAMINOPHEN 650 MG RE SUPP: 650.0000 mg | Freq: Four times a day (QID) | RECTAL | Status: DC | PRN

## 2016-08-05 MED ORDER — ACETAMINOPHEN 325 MG PO TABS
650.0000 mg | ORAL_TABLET | Freq: Four times a day (QID) | ORAL | Status: DC | PRN
  Administered 2016-08-07: 650 mg via ORAL
  Filled 2016-08-05: qty 2

## 2016-08-05 MED ORDER — SODIUM CHLORIDE 0.9% FLUSH
3.0000 mL | Freq: Two times a day (BID) | INTRAVENOUS | Status: DC
  Administered 2016-08-05 – 2016-08-07 (×5): 3 mL via INTRAVENOUS

## 2016-08-05 MED ORDER — INSULIN ASPART 100 UNIT/ML ~~LOC~~ SOLN
0.0000 [IU] | Freq: Every day | SUBCUTANEOUS | Status: DC
Start: 2016-08-05 — End: 2016-08-07

## 2016-08-05 MED ORDER — PANTOPRAZOLE SODIUM 40 MG PO TBEC
40.0000 mg | DELAYED_RELEASE_TABLET | Freq: Two times a day (BID) | ORAL | Status: DC
  Administered 2016-08-05 – 2016-08-07 (×5): 40 mg via ORAL
  Filled 2016-08-05 (×5): qty 1

## 2016-08-05 MED ORDER — DOXAZOSIN MESYLATE 4 MG PO TABS
8.0000 mg | ORAL_TABLET | Freq: Every day | ORAL | Status: DC
  Administered 2016-08-05 – 2016-08-07 (×3): 8 mg via ORAL
  Filled 2016-08-05 (×3): qty 2

## 2016-08-05 MED ORDER — SODIUM CHLORIDE 0.9 % IV SOLN
INTRAVENOUS | Status: DC
  Administered 2016-08-05: 18:00:00 via INTRAVENOUS

## 2016-08-05 NOTE — ED Notes (Signed)
Po fluids provided. Pt and family updated on admission process.

## 2016-08-05 NOTE — Progress Notes (Signed)
[place] [facility] [place] NAME: Shmaya Macek    MR#:  [id number]  DATE OF BIRTH:  05/21/1924  SUBJECTIVE:   Patient here due to sepsis secondary to urinary tract infection. Afebrile today. Lactate level is improved. Hemodynamically stable.  REVIEW OF SYSTEMS:    Review of Systems  Constitutional: Negative for chills and fever.  HENT: Negative for congestion and tinnitus.   Eyes: Negative for blurred vision and double vision.  Respiratory: Negative for cough, shortness of breath and wheezing.   Cardiovascular: Negative for chest pain, orthopnea and PND.  Gastrointestinal: Negative for abdominal pain, diarrhea, nausea and vomiting.  Genitourinary: Negative for dysuria and hematuria.  Neurological: Positive for weakness. Negative for dizziness, sensory change and focal weakness.  All other systems reviewed and are negative.   Nutrition: Heart Healthy/Carb control Tolerating Diet: yes Tolerating PT: Await Eval.    DRUG ALLERGIES:  No Known Allergies  VITALS:  Blood pressure (!) 127/45, pulse 69, temperature 100 F (37.8 C), temperature source Oral, resp. rate 16, height 5\' 11"  (1.803 m), weight 72.9 kg (160 lb 12.8 oz), SpO2 98 %.  PHYSICAL EXAMINATION:   Physical Exam  GENERAL:  82 y.o.-year-old patient lying in bed in no acute distress.  EYES: Pupils equal, round, reactive to light and accommodation. No scleral icterus. Extraocular muscles intact.  HEENT: Head atraumatic, normocephalic. Oropharynx and nasopharynx clear.  NECK:  Supple, no jugular venous distention. No thyroid enlargement, no tenderness.  LUNGS: Normal breath sounds bilaterally, no wheezing, rales, rhonchi. No use of accessory muscles of respiration.  CARDIOVASCULAR: S1, S2 normal. No murmurs, rubs, or gallops.  ABDOMEN: Soft, nontender, nondistended. Bowel sounds present. No organomegaly or mass.  EXTREMITIES: No cyanosis, clubbing or edema b/l.    NEUROLOGIC:  Cranial nerves II through XII are intact. No focal Motor or sensory deficits b/l. Globally weak.   PSYCHIATRIC: The patient is alert and oriented x 3.  SKIN: No obvious rash, lesion, or ulcer.    LABORATORY PANEL:   CBC  Recent Labs Lab 08/05/16 0228  WBC 15.8*  HGB 8.5*  HCT 25.9*  PLT 118*   ------------------------------------------------------------------------------------------------------------------  Chemistries   Recent Labs Lab 08/04/16 1832 08/05/16 0228  NA 136 136  K 3.9 3.9  CL 109 112*  CO2 17* 18*  GLUCOSE 291* 147*  BUN 49* 49*  CREATININE 3.45* 3.13*  CALCIUM 8.5* 8.1*  AST 28  --   ALT 9*  --   ALKPHOS 78  --   BILITOT 0.2*  --    ------------------------------------------------------------------------------------------------------------------  Cardiac Enzymes No results for input(s): TROPONINI in the last 168 hours. ------------------------------------------------------------------------------------------------------------------  RADIOLOGY:  Ct Renal Stone Study  Result Date: 08/04/2016 CLINICAL DATA:  Sepsis.  Fever.  The patient fell today in his room. EXAM: CT ABDOMEN AND PELVIS WITHOUT CONTRAST TECHNIQUE: Multidetector CT imaging of the abdomen and pelvis was performed following the standard protocol without IV contrast. COMPARISON:  None. FINDINGS: Lower chest:  No acute findings. Hepatobiliary: Normal. Pancreas: No mass or inflammatory process identified on this un-enhanced exam. Spleen: Within normal limits in size. Adrenals/Urinary Tract: Normal adrenal glands. Moderate bilateral renal atrophy. 13 mm hyperdense cyst on the lateral aspect of the mid left kidney. Moderate bilateral hydronephrosis. Both ureters are dilated to the bladder. No discrete stone or mass in the bladder. Slight thickening of the bladder wall with several bladder diverticula. Stomach/Bowel: Left inguinal hernia containing several loops of small bowel. No evidence of  obstruction of  the bowel. Scattered diverticula in the distal colon. Vascular/Lymphatic: Extensive aortic atherosclerosis. No adenopathy. Reproductive: Slight prominence of the prostate gland. Other: No free air or free fluid. Benign appearing lipoma deep to the biceps femoris muscle in the proximal left thigh. The Musculoskeletal:  No acute abnormality. IMPRESSION: Or right inguinal hernia containing multiple loops of small bowel. No evidence of obstruction. Probable chronic bilateral hydronephrosis, probably due to chronic bladder outlet obstruction. Multiple bladder diverticula. Electronically Signed   By: Lorriane Shire M.D.   On: 08/04/2016 20:55     ASSESSMENT AND PLAN:   82 year old male with past medical history of hypertension, hyperlipidemia, diabetes type 2 without complication,  chronic kidney disease stage III, BPH, GERD, hyperlipidemia presents to the hospital due to weakness and noted to be septic secondary to urinary tract infection.  1. Sepsis-secondary to UTI. Patient's currently afebrile and hemodynamically stable. Lactate level is improved. -Continue IV fluids, IV ceftriaxone for UTI. Follow urine, blood cultures.  2. Acute on chronic renal failure-baseline creatinine around 2.2 and currently elevated at 3.1. -Secondary to ATN from sepsis and dehydration. Continue IV fluids, follow BUN and creatinine.  3. Urinary tract infection-continue IV ceftriaxone, follow urine cultures.  4. Anemia/thrombocytopenia-anemia is chronic for the patient, thrombocytopenia likely related to underlying sepsis and will follow counts. No evidence of acute bleeding.  5. Diabetes type 2 without complication-continue sliding scale insulin. Hold Amaryl.  6. GERD-continue Protonix.  7. BPH-continue doxazosin.  8. Hyperlipidemia-continue atorvastatin.  I will get a physical therapy evaluation.  All the records are reviewed and case discussed with Care Management/Social Worker. Management plans  discussed with the patient, family and they are in agreement.  CODE STATUS: Full  DVT Prophylaxis: Heparin subcutaneous  TOTAL TIME TAKING CARE OF THIS PATIENT: 30 minutes.   POSSIBLE D/C IN 1-2 DAYS, DEPENDING ON CLINICAL CONDITION.   Henreitta Leber M.D on 08/05/2016 at 2:02 PM  Between 7am to 6pm - Pager - [phone]  After 6pm go to [email or web address] - Proofreader  [facility]  Office  [phone]  CC: Primary care physician; [name], MD

## 2016-08-06 LAB — BASIC METABOLIC PANEL
ANION GAP: 7 (ref 5–15)
BUN: 50 mg/dL — ABNORMAL HIGH (ref 6–20)
CALCIUM: 7.9 mg/dL — AB (ref 8.9–10.3)
CO2: 19 mmol/L — ABNORMAL LOW (ref 22–32)
Chloride: 109 mmol/L (ref 101–111)
Creatinine, Ser: 3.08 mg/dL — ABNORMAL HIGH (ref 0.61–1.24)
GFR, EST AFRICAN AMERICAN: 19 mL/min — AB (ref >60)
GFR, EST NON AFRICAN AMERICAN: 17 mL/min — AB (ref >60)
Glucose, Bld: 106 mg/dL — ABNORMAL HIGH (ref 65–99)
Potassium: 4.7 mmol/L (ref 3.5–5.1)
SODIUM: 135 mmol/L (ref 135–145)

## 2016-08-06 LAB — CBC
HEMATOCRIT: 25.4 % — AB (ref 40.0–52.0)
Hemoglobin: 8.4 g/dL — ABNORMAL LOW (ref 13.0–18.0)
MCH: 27 pg (ref 26.0–34.0)
MCHC: 33.1 g/dL (ref 32.0–36.0)
MCV: 81.5 fL (ref 80.0–100.0)
PLATELETS: 110 10*3/uL — AB (ref 150–440)
RBC: 3.12 MIL/uL — ABNORMAL LOW (ref 4.40–5.90)
RDW: 14.6 % — AB (ref 11.5–14.5)
WBC: 16 10*3/uL — AB (ref 3.8–10.6)

## 2016-08-06 LAB — GLUCOSE, CAPILLARY
GLUCOSE-CAPILLARY: 110 mg/dL — AB (ref 65–99)
GLUCOSE-CAPILLARY: 161 mg/dL — AB (ref 65–99)
Glucose-Capillary: 111 mg/dL — ABNORMAL HIGH (ref 65–99)
Glucose-Capillary: 168 mg/dL — ABNORMAL HIGH (ref 65–99)

## 2016-08-06 LAB — URINE CULTURE

## 2016-08-06 MED ORDER — SODIUM CHLORIDE 0.9 % IV SOLN
INTRAVENOUS | Status: DC
  Administered 2016-08-06 – 2016-08-07 (×2): via INTRAVENOUS

## 2016-08-06 NOTE — Evaluation (Signed)
Physical Therapy Evaluation Patient Details Name: Jay Mathis MRN: [id number] DOB: Mar 09, 1924 Today's Date: 08/06/2016   History of Present Illness  presented to ER secondary to weakness, near syncope and lower abdominal tenderness; admitted with sepsis related to UTI.  Clinical Impression  Upon evaluation, patient alert and oriented; follows all commands.  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  Patient generally weak and deconditioned due to acute illness, but able to complete all functional mobility with no greater than cga/min assist using RW.  Do recommend continued use of RW at this time (for energy conservation and overall stabilization/safety); patient voices understanding/agreement with plan.  Good integration of cuing from therapist noted during session with immediate improvement in functional performance noted. Would benefit from skilled PT to address above deficits and promote optimal return to PLOF; Recommend transition to [place] upon discharge from acute hospitalization.     Follow Up Recommendations Home health PT    Equipment Recommendations       Recommendations for Other Services       Precautions / Restrictions Precautions Precautions: Fall Restrictions Weight Bearing Restrictions: No      Mobility  Bed Mobility Overal bed mobility: Needs Assistance Bed Mobility: Supine to Sit     Supine to sit: Modified independent (Device/Increase time)        Transfers Overall transfer level: Needs assistance Equipment used: Rolling walker (2 wheeled) Transfers: Sit to/from Stand Sit to Stand: Min assist         General transfer comment: requires UE support to complete, heavy use of momentum and multiple attempts.  Poor ant pelvic tilt/lumbar extension noted with movement transition  Ambulation/Gait Ambulation/Gait assistance: Min guard Ambulation Distance (Feet): 150 Feet Assistive device: Rolling walker (2 wheeled)       General Gait Details: decreased heel  strike/toe off bilat with decreased L LE stance time/weight shift.  Flexed, kyphotic posture with decreased cadence and overall gait speed; no overt buckling or LOB.  Stairs            Wheelchair Mobility    Modified Rankin (Stroke Patients Only)       Balance Overall balance assessment: Needs assistance Sitting-balance support: No upper extremity supported;Feet supported Sitting balance-Leahy Scale: Good     Standing balance support: Bilateral upper extremity supported Standing balance-Leahy Scale: Fair                               Pertinent Vitals/Pain Pain Assessment: No/denies pain    Home Living Family/patient expects to be discharged to:: Private residence Living Arrangements: Spouse/significant other Available Help at Discharge: Family Type of Home: House Home Access: Stairs to enter   Technical brewer of Steps: 1 Home Layout: One level Home Equipment: Environmental consultant - 2 wheels;Wheelchair - manual      Prior Function Level of Independence: Independent         Comments: Indep without assist device for ADLs, household and community mobility; caregiver for wife (does not require physical assist; patient primary responsible party for cooking/cleaning)     Hand Dominance        Extremity/Trunk Assessment   Upper Extremity Assessment: Generalized weakness           Lower Extremity Assessment: Generalized weakness (grossly at least 4-/5 throughout)         Communication   Communication: HOH  Cognition Arousal/Alertness: Awake/alert Behavior During Therapy: WFL for tasks assessed/performed Overall Cognitive Status: Within Functional Limits for tasks assessed  General Comments      Exercises Other Exercises Other Exercises: Sit/stand with RW x5 for LE strength/endurance, cga progressing to close sup with education regarding technique, hand placement and weight shift.  Fair/good carry-over with  additional repetitions during sesion      Assessment/Plan    PT Assessment Patient needs continued PT services  PT Diagnosis Difficulty walking;Generalized weakness   PT Problem List Decreased strength;Decreased activity tolerance;Decreased balance;Decreased mobility;Decreased knowledge of use of DME;Decreased safety awareness;Decreased knowledge of precautions  PT Treatment Interventions DME instruction;Gait training;Stair training;Functional mobility training;Therapeutic activities;Therapeutic exercise;Balance training;Patient/family education   PT Goals (Current goals can be found in the Care Plan section) Acute Rehab PT Goals Patient Stated Goal: to return home today PT Goal Formulation: With patient Time For Goal Achievement: 08/20/16 Potential to Achieve Goals: Good    Frequency Min 2X/week   Barriers to discharge Decreased caregiver support      Co-evaluation               End of Session Equipment Utilized During Treatment: Gait belt Activity Tolerance: Patient tolerated treatment well Patient left: in chair;with call bell/phone within reach;with chair alarm set Nurse Communication: Mobility status         Time: 1111-1140 PT Time Calculation (min) (ACUTE ONLY): 29 min   Charges:   PT Evaluation $PT Eval Low Complexity: 1 Procedure PT Treatments $Therapeutic Exercise: 8-22 mins   PT G Codes:        Jay Mathis, PT, DPT, NCS 08/06/16, 3:34 PM [phone]

## 2016-08-06 NOTE — Progress Notes (Signed)
[place] at [place] NAME: Jay Mathis    MR#:  [id number]  DATE OF BIRTH:  04/23/1924  SUBJECTIVE:   Patient here due to sepsis secondary to urinary tract infection. Feels much better and weakness improved.  Blood cultures so far (-).    REVIEW OF SYSTEMS:    Review of Systems  Constitutional: Negative for chills and fever.  HENT: Negative for congestion and tinnitus.   Eyes: Negative for blurred vision and double vision.  Respiratory: Negative for cough, shortness of breath and wheezing.   Cardiovascular: Negative for chest pain, orthopnea and PND.  Gastrointestinal: Negative for abdominal pain, diarrhea, nausea and vomiting.  Genitourinary: Negative for dysuria and hematuria.  Neurological: Positive for weakness. Negative for dizziness, sensory change and focal weakness.  All other systems reviewed and are negative.   Nutrition: Heart Healthy/Carb control Tolerating Diet: yes Tolerating PT: Await Eval.    DRUG ALLERGIES:  No Known Allergies  VITALS:  Blood pressure 133/71, pulse 75, temperature 98.9 F (37.2 C), temperature source Oral, resp. rate 18, height 5\' 11"  (1.803 m), weight 72.9 kg (160 lb 12.8 oz), SpO2 99 %.  PHYSICAL EXAMINATION:   Physical Exam  GENERAL:  82 y.o.-year-old patient lying in bed in no acute distress.  EYES: Pupils equal, round, reactive to light and accommodation. No scleral icterus. Extraocular muscles intact.  HEENT: Head atraumatic, normocephalic. Oropharynx and nasopharynx clear.  NECK:  Supple, no jugular venous distention. No thyroid enlargement, no tenderness.  LUNGS: Normal breath sounds bilaterally, no wheezing, rales, rhonchi. No use of accessory muscles of respiration.  CARDIOVASCULAR: S1, S2 normal. No murmurs, rubs, or gallops.  ABDOMEN: Soft, nontender, nondistended. Bowel sounds present. No organomegaly or mass.  EXTREMITIES: No cyanosis, clubbing or edema b/l.    NEUROLOGIC:  Cranial nerves II through XII are intact. No focal Motor or sensory deficits b/l. Globally weak.   PSYCHIATRIC: The patient is alert and oriented x 3. SKIN: No obvious rash, lesion, or ulcer.    LABORATORY PANEL:   CBC  Recent Labs Lab 08/06/16 0540  WBC 16.0*  HGB 8.4*  HCT 25.4*  PLT 110*   ------------------------------------------------------------------------------------------------------------------  Chemistries   Recent Labs Lab 08/04/16 1832  08/06/16 0540  NA 136  < > 135  K 3.9  < > 4.7  CL 109  < > 109  CO2 17*  < > 19*  GLUCOSE 291*  < > 106*  BUN 49*  < > 50*  CREATININE 3.45*  < > 3.08*  CALCIUM 8.5*  < > 7.9*  AST 28  --   --   ALT 9*  --   --   ALKPHOS 78  --   --   BILITOT 0.2*  --   --   < > = values in this interval not displayed. ------------------------------------------------------------------------------------------------------------------  Cardiac Enzymes No results for input(s): TROPONINI in the last 168 hours. ------------------------------------------------------------------------------------------------------------------  RADIOLOGY:  Ct Renal Stone Study  Result Date: 08/04/2016 CLINICAL DATA:  Sepsis.  Fever.  The patient fell today in his room. EXAM: CT ABDOMEN AND PELVIS WITHOUT CONTRAST TECHNIQUE: Multidetector CT imaging of the abdomen and pelvis was performed following the standard protocol without IV contrast. COMPARISON:  None. FINDINGS: Lower chest:  No acute findings. Hepatobiliary: Normal. Pancreas: No mass or inflammatory process identified on this un-enhanced exam. Spleen: Within normal limits in size. Adrenals/Urinary Tract: Normal adrenal glands. Moderate bilateral renal atrophy. 13 mm hyperdense cyst on the lateral aspect  of the mid left kidney. Moderate bilateral hydronephrosis. Both ureters are dilated to the bladder. No discrete stone or mass in the bladder. Slight thickening of the bladder wall with several bladder  diverticula. Stomach/Bowel: Left inguinal hernia containing several loops of small bowel. No evidence of obstruction of the bowel. Scattered diverticula in the distal colon. Vascular/Lymphatic: Extensive aortic atherosclerosis. No adenopathy. Reproductive: Slight prominence of the prostate gland. Other: No free air or free fluid. Benign appearing lipoma deep to the biceps femoris muscle in the proximal left thigh. The Musculoskeletal:  No acute abnormality. IMPRESSION: Or right inguinal hernia containing multiple loops of small bowel. No evidence of obstruction. Probable chronic bilateral hydronephrosis, probably due to chronic bladder outlet obstruction. Multiple bladder diverticula. Electronically Signed   By: Lorriane Shire M.D.   On: 08/04/2016 20:55     ASSESSMENT AND PLAN:   82 year old male with past medical history of hypertension, hyperlipidemia, diabetes type 2 without complication,  chronic kidney disease stage III, BPH, GERD, hyperlipidemia presents to the hospital due to weakness and noted to be septic secondary to urinary tract infection.  1. Sepsis-secondary to UTI. Patient's currently afebrile and hemodynamically stable. Lactate level  improved. -Continue IV fluids, IV ceftriaxone for UTI.  - cultures so far (-).  2. Acute on chronic renal failure-baseline creatinine around 2.2 and currently elevated at 3.1. -Secondary to ATN from sepsis and dehydration. Continue IV fluids, follow BUN and creatinine.  3. Urinary tract infection-continue IV ceftriaxone - cultures so far (-).   4. Anemia/thrombocytopenia-anemia is chronic for the patient, thrombocytopenia likely related to underlying sepsis and will follow counts. No evidence of acute bleeding.  5. Diabetes type 2 without complication-continue sliding scale insulin. Cont. To Hold Amaryl.  6. GERD-continue Protonix.  7. BPH-continue doxazosin.  8. Hyperlipidemia-continue atorvastatin.  -await PT eval.   All the records are  reviewed and case discussed with Care Management/Social Worker. Management plans discussed with the patient, family and they are in agreement.  CODE STATUS: Full  DVT Prophylaxis: Heparin subcutaneous  TOTAL TIME TAKING CARE OF THIS PATIENT: 30 minutes.   POSSIBLE D/C IN 1-2 DAYS, DEPENDING ON CLINICAL CONDITION.   Henreitta Leber M.D on 08/06/2016 at 2:41 PM  Between 7am to 6pm - Pager - [phone]  After 6pm go to [email or web address] - Proofreader  [facility]  Office  [phone]  CC: Primary care physician; [name], MD

## 2016-08-07 LAB — BASIC METABOLIC PANEL
ANION GAP: 5 (ref 5–15)
BUN: 48 mg/dL — ABNORMAL HIGH (ref 6–20)
CO2: 20 mmol/L — AB (ref 22–32)
Calcium: 8 mg/dL — ABNORMAL LOW (ref 8.9–10.3)
Chloride: 113 mmol/L — ABNORMAL HIGH (ref 101–111)
Creatinine, Ser: 2.7 mg/dL — ABNORMAL HIGH (ref 0.61–1.24)
GFR calc non Af Amer: 19 mL/min — ABNORMAL LOW (ref >60)
GFR, EST AFRICAN AMERICAN: 23 mL/min — AB (ref >60)
GLUCOSE: 100 mg/dL — AB (ref 65–99)
POTASSIUM: 4.6 mmol/L (ref 3.5–5.1)
Sodium: 138 mmol/L (ref 135–145)

## 2016-08-07 LAB — CBC
HEMATOCRIT: 23.8 % — AB (ref 40.0–52.0)
HEMOGLOBIN: 8 g/dL — AB (ref 13.0–18.0)
MCH: 27.8 pg (ref 26.0–34.0)
MCHC: 33.8 g/dL (ref 32.0–36.0)
MCV: 82.1 fL (ref 80.0–100.0)
Platelets: 107 10*3/uL — ABNORMAL LOW (ref 150–440)
RBC: 2.9 MIL/uL — AB (ref 4.40–5.90)
RDW: 14.9 % — ABNORMAL HIGH (ref 11.5–14.5)
WBC: 12.9 10*3/uL — AB (ref 3.8–10.6)

## 2016-08-07 LAB — GLUCOSE, CAPILLARY
Glucose-Capillary: 111 mg/dL — ABNORMAL HIGH (ref 65–99)
Glucose-Capillary: 143 mg/dL — ABNORMAL HIGH (ref 65–99)

## 2016-08-07 MED ORDER — CEFUROXIME AXETIL 250 MG PO TABS: 250.0000 mg | ORAL_TABLET | Freq: Two times a day (BID) | ORAL | 0 refills | Status: DC

## 2016-08-07 NOTE — Care Management Important Message (Signed)
Important Message  Patient Details  Name: Jay Mathis MRN: [id number] Date of Birth: 05-04-1924   Medicare Important Message Given:  Yes    Beverly Sessions, RN 08/07/2016, 1:36 PM

## 2016-08-07 NOTE — Progress Notes (Signed)
Patient discharge teaching given, including activity, diet, follow-up appoints, and medications. Patient verbalized understanding of all discharge instructions. IV access was d/c'd. Vitals are stable. Skin is intact except as charted in most recent assessments. Pt to be escorted out by volunteer, to be driven home by son. Home Health has been set up.  Angus Seller

## 2016-08-07 NOTE — Discharge Summary (Signed)
[facility] NAME: Axten Jardon    MR#:  [id number]  DATE OF BIRTH:  07/12/1924  DATE OF ADMISSION:  08/04/2016 ADMITTING PHYSICIAN: Lance Coon, MD  DATE OF DISCHARGE: 08/07/2016  PRIMARY CARE PHYSICIAN: MASOUD,JAVED, MD    ADMISSION DIAGNOSIS:  UTI (lower urinary tract infection) [N39.0] Hyperglycemia [R73.9] Renal insufficiency [N28.9] Sepsis ([place]) [A41.9] Sepsis, due to unspecified organism ([place]) [A41.9]  DISCHARGE DIAGNOSIS:  Principal Problem:   Sepsis ([place]) Active Problems:   UTI (lower urinary tract infection)   HLD (hyperlipidemia)   Diabetes ([place])   GERD (gastroesophageal reflux disease)   Acute on chronic kidney failure ([place])   SECONDARY DIAGNOSIS:   Past Medical History:  Diagnosis Date  . Diabetes mellitus without complication ([place])   . Hyperlipidemia     HOSPITAL COURSE:   82 year old male with past medical history of hypertension, hyperlipidemia, diabetes type 2 without complication,  chronic kidney disease stage III, BPH, GERD, hyperlipidemia presents to the hospital due to weakness and noted to be septic secondary to urinary tract infection.  1. Sepsis-this was secondary to UTI. Improved and resolved with treatment with IV fluids, IV antibiotics. -Patient is currently being discharged on oral Ceftin. -Patient's blood, urine cultures remained negative although.  2. Acute on chronic renal failure-baseline creatinine around 2.2 and presented w/ Cr over 3.1. -this was Secondary to ATN from sepsis and dehydration. Patient was given IV fluids and his BUN/creatinine is now close to baseline and he is now being discharged home.  3. Urinary tract infection- while in the hospital pt. Was placed on IV Ceftriaxone and now being discharged on Oral Ceftin. Urine culture had insignificant growth.   4. Anemia/thrombocytopenia-anemia is chronic for the patient, thrombocytopenia likely related to underlying  sepsis and is currently stable and can be further followed as outpatient.    5. Diabetes type 2 without complication -  on the hospital patient was in sinus) not being discharged on oral Amaryl  6. GERD- pt. Will continue Protonix.  7. BPH- pt. Will continue doxazosin.  8. Hyperlipidemia- pt. Will continue atorvastatin.  DISCHARGE CONDITIONS:   Stable  CONSULTS OBTAINED:    DRUG ALLERGIES:  No Known Allergies  DISCHARGE MEDICATIONS:     Medication List    TAKE these medications   aspirin EC 81 MG tablet Take 81 mg by mouth daily.   atorvastatin 10 MG tablet Commonly known as:  LIPITOR Take 10 mg by mouth at bedtime.   cefUROXime 250 MG tablet Commonly known as:  CEFTIN Take 1 tablet (250 mg total) by mouth 2 (two) times daily with a meal.   doxazosin 8 MG tablet Commonly known as:  CARDURA Take 8 mg by mouth daily.   furosemide 20 MG tablet Commonly known as:  LASIX Take 20 mg by mouth daily.   glimepiride 2 MG tablet Commonly known as:  AMARYL Take 4 mg by mouth every evening.   pantoprazole 40 MG tablet Commonly known as:  PROTONIX Take 40 mg by mouth 2 (two) times daily.         DISCHARGE INSTRUCTIONS:   DIET:  Cardiac diet and Diabetic diet  DISCHARGE CONDITION:  Stable  ACTIVITY:  Activity as tolerated  OXYGEN:  Home Oxygen: No.   Oxygen Delivery: room air  DISCHARGE LOCATION:  Home with Home Health PT, RN   If you experience worsening of your admission symptoms, develop shortness of breath, life threatening emergency, suicidal or homicidal thoughts you must  seek medical attention immediately by calling 911 or calling your MD immediately  if symptoms less severe.  You Must read complete instructions/literature along with all the possible adverse reactions/side effects for all the Medicines you take and that have been prescribed to you. Take any new Medicines after you have completely understood and accpet all the possible adverse  reactions/side effects.   Please note  You were cared for by a hospitalist during your hospital stay. If you have any questions about your discharge medications or the care you received while you were in the hospital after you are discharged, you can call the unit and asked to speak with the hospitalist on call if the hospitalist that took care of you is not available. Once you are discharged, your primary care physician will handle any further medical issues. Please note that NO REFILLS for any discharge medications will be authorized once you are discharged, as it is imperative that you return to your primary care physician (or establish a relationship with a primary care physician if you do not have one) for your aftercare needs so that they can reassess your need for medications and monitor your lab values.     Today   Pt. Feels much better today and weakness improved.  No complaints presently.    VITAL SIGNS:  Blood pressure (!) 153/52, pulse (!) 59, temperature 98.1 F (36.7 C), temperature source Oral, resp. rate 18, height 5\' 11"  (1.803 m), weight 72.9 kg (160 lb 12.8 oz), SpO2 97 %.  I/O:   Intake/Output Summary (Last 24 hours) at 08/07/16 1401 Last data filed at 08/07/16 1043  Gross per 24 hour  Intake             1625 ml  Output                0 ml  Net             1625 ml    PHYSICAL EXAMINATION:   GENERAL:  82 y.o.-year-old patient lying in bed in no acute distress.  EYES: Pupils equal, round, reactive to light and accommodation. No scleral icterus. Extraocular muscles intact.  HEENT: Head atraumatic, normocephalic. Oropharynx and nasopharynx clear.  NECK:  Supple, no jugular venous distention. No thyroid enlargement, no tenderness.  LUNGS: Normal breath sounds bilaterally, no wheezing, rales, rhonchi. No use of accessory muscles of respiration.  CARDIOVASCULAR: S1, S2 normal. No murmurs, rubs, or gallops.  ABDOMEN: Soft, nontender, nondistended. Bowel sounds present.  No organomegaly or mass.  EXTREMITIES: No cyanosis, clubbing or edema b/l.    NEUROLOGIC: Cranial nerves II through XII are intact. No focal Motor or sensory deficits b/l. Globally weak.   PSYCHIATRIC: The patient is alert and oriented x 3. SKIN: No obvious rash, lesion, or ulcer.   DATA REVIEW:   CBC  Recent Labs Lab 08/07/16 0441  WBC 12.9*  HGB 8.0*  HCT 23.8*  PLT 107*    Chemistries   Recent Labs Lab 08/04/16 1832  08/07/16 0441  NA 136  < > 138  K 3.9  < > 4.6  CL 109  < > 113*  CO2 17*  < > 20*  GLUCOSE 291*  < > 100*  BUN 49*  < > 48*  CREATININE 3.45*  < > 2.70*  CALCIUM 8.5*  < > 8.0*  AST 28  --   --   ALT 9*  --   --   ALKPHOS 78  --   --  BILITOT 0.2*  --   --   < > = values in this interval not displayed.  Cardiac Enzymes No results for input(s): TROPONINI in the last 168 hours.  Microbiology Results  Results for orders placed or performed during the hospital encounter of 08/04/16  Blood Culture (routine x 2)     Status: None (Preliminary result)   Collection Time: 08/04/16  6:32 PM  Result Value Ref Range Status   Specimen Description BLOOD LEFT HAND  Final   Special Requests BOTTLES DRAWN AEROBIC AND ANAEROBIC 55C  Final   Culture NO GROWTH 3 DAYS  Final   Report Status PENDING  Incomplete  Blood Culture (routine x 2)     Status: None (Preliminary result)   Collection Time: 08/04/16  6:32 PM  Result Value Ref Range Status   Specimen Description BLOOD RIGHT FOREARM  Final   Special Requests BOTTLES DRAWN AEROBIC AND ANAEROBIC 8CC  Final   Culture NO GROWTH 3 DAYS  Final   Report Status PENDING  Incomplete  Urine culture     Status: Abnormal   Collection Time: 08/04/16  7:04 PM  Result Value Ref Range Status   Specimen Description URINE, RANDOM  Final   Special Requests NONE  Final   Culture (A)  Final    <10,000 COLONIES/mL INSIGNIFICANT GROWTH Performed at [facility]    Report Status 08/06/2016 FINAL  Final    RADIOLOGY:   No results found.    Management plans discussed with the patient, family and they are in agreement.  CODE STATUS:     Code Status Orders        Start     Ordered   08/05/16 0028  Full code  Continuous     08/05/16 0027    Code Status History    Date Active Date Inactive Code Status Order ID Comments User Context   This patient has a current code status but no historical code status.    Advance Directive Documentation   [place] Most Recent Value  Type of Advance Directive  Living will  Pre-existing out of facility DNR order (yellow form or pink MOST form)  No data  "MOST" Form in Place?  No data      TOTAL TIME TAKING CARE OF THIS PATIENT: 40 minutes.    Henreitta Leber M.D on 08/07/2016 at 2:01 PM  Between 7am to 6pm - Pager - [phone]  After 6pm go to [email or web address] - Proofreader  [facility]  Office  [phone]  CC: Primary care physician; [name], MD

## 2016-08-07 NOTE — Care Management (Signed)
Patient for discharge home today.  Son will transport home.  has a rolling walker.  Physical therapy has recommended home health physical therapy.  Patient is in agreement with home health  and agency preference is [facility].  Referral called and accepted by [facility].  Patient declines that there are issues present that adversely affects his ability to access medical care or obtaining medications.  he is current with his PCP Dr Lavera Guise.  Confirmed contact information

## 2016-08-09 LAB — CULTURE, BLOOD (ROUTINE X 2)
CULTURE: NO GROWTH
Culture: NO GROWTH

## 2016-10-24 DIAGNOSIS — Z882 Allergy status to sulfonamides status: Secondary | ICD-10-CM | POA: Diagnosis not present

## 2016-10-24 DIAGNOSIS — E119 Type 2 diabetes mellitus without complications: Secondary | ICD-10-CM | POA: Diagnosis not present

## 2016-10-24 DIAGNOSIS — I25810 Atherosclerosis of coronary artery bypass graft(s) without angina pectoris: Secondary | ICD-10-CM | POA: Diagnosis not present

## 2016-10-24 DIAGNOSIS — H9190 Unspecified hearing loss, unspecified ear: Secondary | ICD-10-CM | POA: Diagnosis not present

## 2017-01-23 DIAGNOSIS — I25810 Atherosclerosis of coronary artery bypass graft(s) without angina pectoris: Secondary | ICD-10-CM | POA: Diagnosis not present

## 2017-01-23 DIAGNOSIS — I519 Heart disease, unspecified: Secondary | ICD-10-CM | POA: Diagnosis not present

## 2017-01-23 DIAGNOSIS — L02212 Cutaneous abscess of back [any part, except buttock]: Secondary | ICD-10-CM | POA: Diagnosis not present

## 2017-01-23 DIAGNOSIS — H9190 Unspecified hearing loss, unspecified ear: Secondary | ICD-10-CM | POA: Diagnosis not present

## 2017-01-28 DIAGNOSIS — E119 Type 2 diabetes mellitus without complications: Secondary | ICD-10-CM | POA: Diagnosis not present

## 2017-01-28 DIAGNOSIS — I459 Conduction disorder, unspecified: Secondary | ICD-10-CM | POA: Diagnosis not present

## 2017-01-28 DIAGNOSIS — L02212 Cutaneous abscess of back [any part, except buttock]: Secondary | ICD-10-CM | POA: Diagnosis not present

## 2017-01-28 DIAGNOSIS — I10 Essential (primary) hypertension: Secondary | ICD-10-CM | POA: Diagnosis not present

## 2017-01-30 DIAGNOSIS — L02212 Cutaneous abscess of back [any part, except buttock]: Secondary | ICD-10-CM | POA: Diagnosis not present

## 2017-01-30 DIAGNOSIS — H9190 Unspecified hearing loss, unspecified ear: Secondary | ICD-10-CM | POA: Diagnosis not present

## 2017-01-30 DIAGNOSIS — I25810 Atherosclerosis of coronary artery bypass graft(s) without angina pectoris: Secondary | ICD-10-CM | POA: Diagnosis not present

## 2017-01-30 DIAGNOSIS — Z882 Allergy status to sulfonamides status: Secondary | ICD-10-CM | POA: Diagnosis not present

## 2017-04-24 DIAGNOSIS — I25810 Atherosclerosis of coronary artery bypass graft(s) without angina pectoris: Secondary | ICD-10-CM | POA: Diagnosis not present

## 2017-04-24 DIAGNOSIS — H9190 Unspecified hearing loss, unspecified ear: Secondary | ICD-10-CM | POA: Diagnosis not present

## 2017-04-24 DIAGNOSIS — I459 Conduction disorder, unspecified: Secondary | ICD-10-CM | POA: Diagnosis not present

## 2017-04-24 DIAGNOSIS — I10 Essential (primary) hypertension: Secondary | ICD-10-CM | POA: Diagnosis not present

## 2017-04-24 DIAGNOSIS — R55 Syncope and collapse: Secondary | ICD-10-CM | POA: Diagnosis not present

## 2017-04-25 DIAGNOSIS — E119 Type 2 diabetes mellitus without complications: Secondary | ICD-10-CM | POA: Diagnosis not present

## 2017-04-25 DIAGNOSIS — R5381 Other malaise: Secondary | ICD-10-CM | POA: Diagnosis not present

## 2017-04-25 DIAGNOSIS — Z125 Encounter for screening for malignant neoplasm of prostate: Secondary | ICD-10-CM | POA: Diagnosis not present

## 2017-04-25 DIAGNOSIS — I10 Essential (primary) hypertension: Secondary | ICD-10-CM | POA: Diagnosis not present

## 2017-04-25 DIAGNOSIS — E784 Other hyperlipidemia: Secondary | ICD-10-CM | POA: Diagnosis not present

## 2017-05-01 DIAGNOSIS — N189 Chronic kidney disease, unspecified: Secondary | ICD-10-CM | POA: Diagnosis not present

## 2017-05-01 DIAGNOSIS — I10 Essential (primary) hypertension: Secondary | ICD-10-CM | POA: Diagnosis not present

## 2017-05-01 DIAGNOSIS — I25810 Atherosclerosis of coronary artery bypass graft(s) without angina pectoris: Secondary | ICD-10-CM | POA: Diagnosis not present

## 2017-05-01 DIAGNOSIS — D638 Anemia in other chronic diseases classified elsewhere: Secondary | ICD-10-CM | POA: Diagnosis not present

## 2017-05-07 DIAGNOSIS — I459 Conduction disorder, unspecified: Secondary | ICD-10-CM | POA: Diagnosis not present

## 2017-05-07 DIAGNOSIS — D638 Anemia in other chronic diseases classified elsewhere: Secondary | ICD-10-CM | POA: Diagnosis not present

## 2017-05-07 DIAGNOSIS — N200 Calculus of kidney: Secondary | ICD-10-CM | POA: Diagnosis not present

## 2017-05-07 DIAGNOSIS — N183 Chronic kidney disease, stage 3 (moderate): Secondary | ICD-10-CM | POA: Diagnosis not present

## 2017-05-08 DIAGNOSIS — D518 Other vitamin B12 deficiency anemias: Secondary | ICD-10-CM | POA: Diagnosis not present

## 2017-05-08 DIAGNOSIS — E784 Other hyperlipidemia: Secondary | ICD-10-CM | POA: Diagnosis not present

## 2017-05-08 DIAGNOSIS — R5381 Other malaise: Secondary | ICD-10-CM | POA: Diagnosis not present

## 2017-05-08 DIAGNOSIS — I10 Essential (primary) hypertension: Secondary | ICD-10-CM | POA: Diagnosis not present

## 2017-05-10 DIAGNOSIS — N200 Calculus of kidney: Secondary | ICD-10-CM | POA: Diagnosis not present

## 2017-05-10 DIAGNOSIS — N400 Enlarged prostate without lower urinary tract symptoms: Secondary | ICD-10-CM | POA: Diagnosis not present

## 2017-05-10 DIAGNOSIS — N183 Chronic kidney disease, stage 3 (moderate): Secondary | ICD-10-CM | POA: Diagnosis not present

## 2017-05-10 DIAGNOSIS — D638 Anemia in other chronic diseases classified elsewhere: Secondary | ICD-10-CM | POA: Diagnosis not present

## 2017-05-23 ENCOUNTER — Ambulatory Visit: Payer: Commercial Managed Care - HMO | Admitting: Hematology and Oncology

## 2017-06-04 DIAGNOSIS — D631 Anemia in chronic kidney disease: Secondary | ICD-10-CM | POA: Diagnosis not present

## 2017-06-04 DIAGNOSIS — E1122 Type 2 diabetes mellitus with diabetic chronic kidney disease: Secondary | ICD-10-CM | POA: Diagnosis not present

## 2017-06-04 DIAGNOSIS — N184 Chronic kidney disease, stage 4 (severe): Secondary | ICD-10-CM | POA: Diagnosis not present

## 2017-06-04 DIAGNOSIS — I129 Hypertensive chronic kidney disease with stage 1 through stage 4 chronic kidney disease, or unspecified chronic kidney disease: Secondary | ICD-10-CM | POA: Diagnosis not present

## 2017-06-05 ENCOUNTER — Other Ambulatory Visit: Payer: Self-pay | Admitting: Internal Medicine

## 2017-06-05 ENCOUNTER — Other Ambulatory Visit: Payer: Self-pay | Admitting: Nephrology

## 2017-06-05 DIAGNOSIS — E1122 Type 2 diabetes mellitus with diabetic chronic kidney disease: Secondary | ICD-10-CM | POA: Diagnosis not present

## 2017-06-05 DIAGNOSIS — N184 Chronic kidney disease, stage 4 (severe): Secondary | ICD-10-CM | POA: Diagnosis not present

## 2017-06-05 DIAGNOSIS — I129 Hypertensive chronic kidney disease with stage 1 through stage 4 chronic kidney disease, or unspecified chronic kidney disease: Secondary | ICD-10-CM | POA: Diagnosis not present

## 2017-06-05 DIAGNOSIS — D631 Anemia in chronic kidney disease: Secondary | ICD-10-CM | POA: Diagnosis not present

## 2017-06-07 ENCOUNTER — Other Ambulatory Visit: Payer: Self-pay | Admitting: Nephrology

## 2017-06-07 DIAGNOSIS — N139 Obstructive and reflux uropathy, unspecified: Secondary | ICD-10-CM

## 2017-06-10 ENCOUNTER — Ambulatory Visit
Admission: RE | Admit: 2017-06-10 | Discharge: 2017-06-10 | Disposition: A | Discharge status: Home / Self Care | Payer: Medicare HMO | Source: Ambulatory Visit | Attending: Nephrology | Admitting: Nephrology

## 2017-06-10 DIAGNOSIS — N261 Atrophy of kidney (terminal): Secondary | ICD-10-CM | POA: Insufficient documentation

## 2017-06-10 DIAGNOSIS — N323 Diverticulum of bladder: Secondary | ICD-10-CM | POA: Diagnosis not present

## 2017-06-10 DIAGNOSIS — K4090 Unilateral inguinal hernia, without obstruction or gangrene, not specified as recurrent: Secondary | ICD-10-CM | POA: Insufficient documentation

## 2017-06-10 DIAGNOSIS — N400 Enlarged prostate without lower urinary tract symptoms: Secondary | ICD-10-CM | POA: Insufficient documentation

## 2017-06-10 DIAGNOSIS — N139 Obstructive and reflux uropathy, unspecified: Secondary | ICD-10-CM | POA: Diagnosis not present

## 2017-06-14 ENCOUNTER — Other Ambulatory Visit: Payer: Self-pay

## 2017-06-19 ENCOUNTER — Encounter: Payer: Self-pay | Admitting: Surgery

## 2017-06-19 ENCOUNTER — Ambulatory Visit (INDEPENDENT_AMBULATORY_CARE_PROVIDER_SITE_OTHER): Payer: Medicare HMO | Admitting: Surgery

## 2017-06-19 VITALS — BP 140/80 | HR 71 | Temp 97.7°F | Ht 71.0 in | Wt 155.6 lb

## 2017-06-19 DIAGNOSIS — K4090 Unilateral inguinal hernia, without obstruction or gangrene, not specified as recurrent: Secondary | ICD-10-CM

## 2017-06-19 NOTE — Patient Instructions (Signed)
Inguinal Hernia, Adult An inguinal hernia is when fat or the intestines push through the area where the leg meets the lower belly (groin) and make a rounded lump (bulge). This condition happens over time. There are three types of inguinal hernias. These types include:  Hernias that can be pushed back into the belly (are reducible).  Hernias that cannot be pushed back into the belly (are incarcerated).  Hernias that cannot be pushed back into the belly and lose their blood supply (get strangulated). This type needs emergency surgery.  Follow these instructions at home: Lifestyle  Drink enough fluid to keep your urine (pee) clear or pale yellow.  Eat plenty of fruits, vegetables, and whole grains. These have a lot of fiber. Talk with your doctor if you have questions.  Avoid lifting heavy objects.  Avoid standing for long periods of time.  Do not use tobacco products. These include cigarettes, chewing tobacco, or e-cigarettes. If you need help quitting, ask your doctor.  Try to stay at a healthy weight. General instructions  Do not try to force the hernia back in.  Watch your hernia for any changes in color or size. Let your doctor know if there are any changes.  Take over-the-counter and prescription medicines only as told by your doctor.  Keep all follow-up visits as told by your doctor. This is important. Contact a doctor if:  You have a fever.  You have new symptoms.  Your symptoms get worse. Get help right away if:  The area where the legs meets the lower belly has: ? Pain that gets worse suddenly. ? A bulge that gets bigger suddenly and does not go down. ? A bulge that turns red or purple. ? A bulge that is painful to the touch.  You are a man and your scrotum: ? Suddenly feels painful. ? Suddenly changes in size.  You feel sick to your stomach (nauseous) and this feeling does not go away.  You throw up (vomit) and this keeps happening.  You feel your heart  beating a lot more quickly than normal.  You cannot poop (have a bowel movement) or pass gas. This information is not intended to replace advice given to you by your health care provider. Make sure you discuss any questions you have with your health care provider. Document Released: 12/27/2006 Document Revised: 05/03/2016 Document Reviewed: 10/06/2014 Elsevier Interactive Patient Education  2018 [place] on your back and relaxed try pushing the hernia/bludge back in slowly.  You may also want to purchase a hernia brace to wear to keep from having the hernia budge out.   If when trying to put the hernia/bludge back in and it doesn't go and/or it is causing more pain please call our office immediately.  Please follow up with your Primary Care Provider for a possible yeast infection that is present in the groin area.   Marland Kitchen

## 2017-06-19 NOTE — Progress Notes (Signed)
Surgical Consultation  06/19/2017  Jay Mathis is an 83 y.o. male.   Chief Complaint  Patient presents with  . New Patient (Initial Visit)    Right inguinal hernia--referred by [place].     HPI: 83 year old male with a history of anemia, thrombocytopenia and coronary artery disease seen for a symptomatic right inguinal hernia. He reports that the hernia is been present for several months and now he is unable to push it back in. There is some intermittent pain. He is tolerating diet and he is nontoxic. He reports that the pain is dull moderate in intensity. On the previous abdominal operation is an antrectomy.  He Had a CABG more than 26 years ago at [facility]. He lives alone but currently is independent and is able to walk. He does have some easy bruising from his Tthombocytopenia.  CT scan on 06/10/2017 that I have personally reviewed showed a right inguinal hernia and some mild stranding. No evidence of incarceration.  Past Medical History:  Diagnosis Date  . Diabetes mellitus without complication ([place])   . Hyperlipidemia     Past Surgical History:  Procedure Laterality Date  . APPENDECTOMY     60 plus years ago  . CORONARY ANGIOPLASTY WITH STENT PLACEMENT    . CORONARY ARTERY BYPASS GRAFT      History reviewed. No pertinent family history.  Social History:  reports that he has quit smoking. He has never used smokeless tobacco. He reports that he does not drink alcohol or use drugs.  Allergies:  Allergies  Allergen Reactions  . Sulfa Antibiotics Rash    Medications reviewed.   ROS Full ROS performed and is otherwise negative other than what is stated in the HPI   BP 140/80   Pulse 71   Temp 97.7 F (36.5 C) (Oral)   Ht 5\' 11"  (1.803 m)   Wt 70.6 kg (155 lb 9.6 oz)   BMI 21.70 kg/m   Physical Exam  Constitutional: He is oriented to person, place, and time and well-developed, well-nourished, and in no distress. No distress.  HENT:  Right ecchymosis right  eyelid  Neck: Normal range of motion. No JVD present. No tracheal deviation present.  Pulmonary/Chest: Effort normal. No respiratory distress. He exhibits no tenderness.  Abdominal: Soft. He exhibits no distension. There is no tenderness. There is no rebound and no guarding.  I reduced her RIH, mildly tender to palpation. No peritonitis. There is significant Candida within the scrotal fold  Genitourinary: Penis normal. No discharge found.  Musculoskeletal: Normal range of motion. He exhibits no edema.  Neurological: He is alert and oriented to person, place, and time. Gait normal. GCS score is 15.  Skin: Skin is warm and dry.  Psychiatric: Mood, memory, affect and judgment normal.  Nursing note and vitals reviewed.     No results found for this or any previous visit (from the past 48 hour(s)). No results found.  Assessment/Plan:  Right inguinal hernia on a thrombocytopenic and anemic pt w hx of CAD. Had a lengthy discussion with the patient and I am concerned he could do surgery on him he may have a major complication from bleeding or cardiovascular issues given her comorbidities and his fragility. Right now I recommend against surgical revision and recommend medical management with follow-up. And I have discussed with him about potentially doing a brace to the right inguinal area. He knows that if the pain gets worse or if he is unable to reduce it he should come to  our office or to the emergency room. I spent at least 45 minutes in this encounter with greater than 50% of the time spent in coordination and counseling.   Caroleen Hamman, MD [facility] General Surgeon

## 2017-07-08 DIAGNOSIS — N2581 Secondary hyperparathyroidism of renal origin: Secondary | ICD-10-CM | POA: Diagnosis not present

## 2017-07-08 DIAGNOSIS — D631 Anemia in chronic kidney disease: Secondary | ICD-10-CM | POA: Diagnosis not present

## 2017-07-08 DIAGNOSIS — E559 Vitamin D deficiency, unspecified: Secondary | ICD-10-CM | POA: Diagnosis not present

## 2017-07-08 DIAGNOSIS — N184 Chronic kidney disease, stage 4 (severe): Secondary | ICD-10-CM | POA: Diagnosis not present

## 2017-07-08 DIAGNOSIS — E872 Acidosis: Secondary | ICD-10-CM | POA: Diagnosis not present

## 2017-07-08 DIAGNOSIS — I129 Hypertensive chronic kidney disease with stage 1 through stage 4 chronic kidney disease, or unspecified chronic kidney disease: Secondary | ICD-10-CM | POA: Diagnosis not present

## 2017-07-08 DIAGNOSIS — E1122 Type 2 diabetes mellitus with diabetic chronic kidney disease: Secondary | ICD-10-CM | POA: Diagnosis not present

## 2017-07-21 IMAGING — CT CT ABD-PELV W/O CM
2 of 5 series · 16 of 46 positions shown, 18 images · non-contrast
Comparison: 08/04/2016

CLINICAL DATA: Known hernia for 2 years with worsening pain.
History of appendectomy.

EXAM:
CT ABDOMEN AND PELVIS WITHOUT CONTRAST
TECHNIQUE: Multidetector CT imaging of the abdomen and pelvis was performed
following the standard protocol without IV contrast.

[Series 2: routine abd/pel wo · axial · 0.84mm/px · z∈[-542,-97]mm · 13 of 99 slices shown, 15 images]
[im 5/99  soft-tissue]
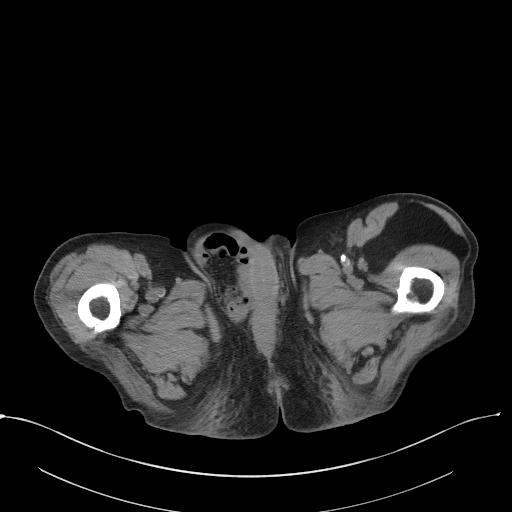
[im 5/99  bone]
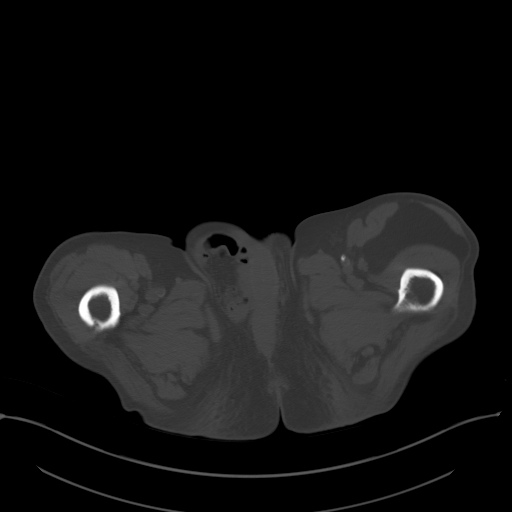
[im 15/99  soft-tissue]
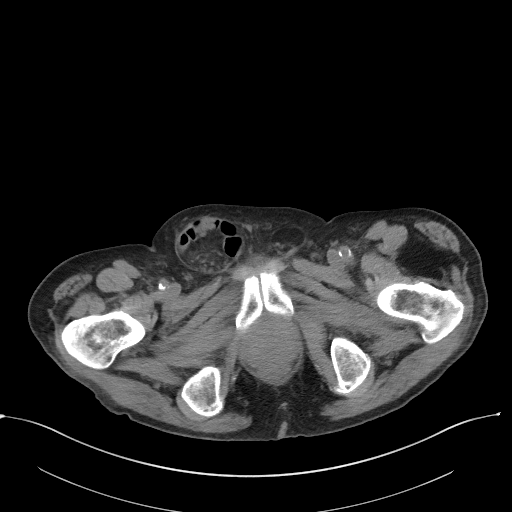
[im 20/99  soft-tissue]
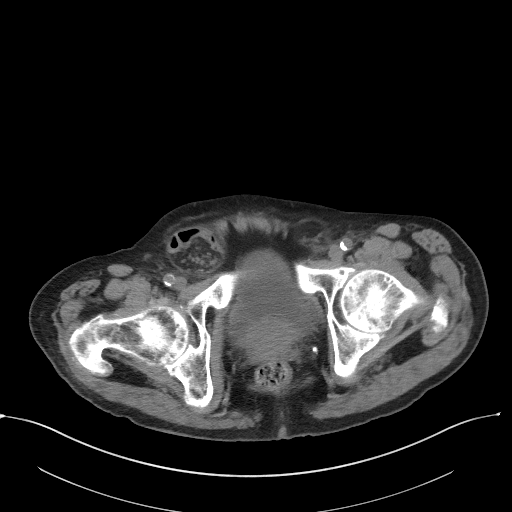
[im 30/99  soft-tissue]
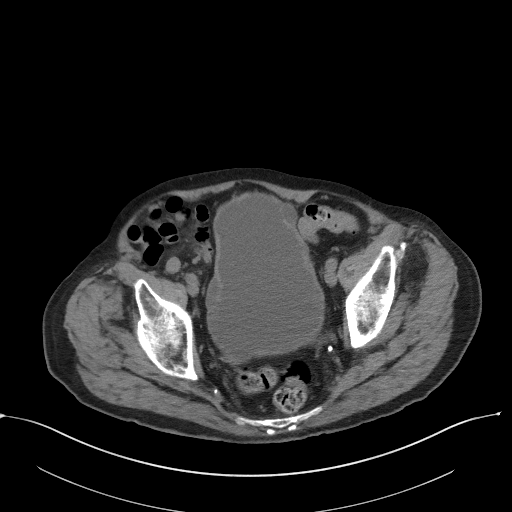
[im 35/99  soft-tissue]
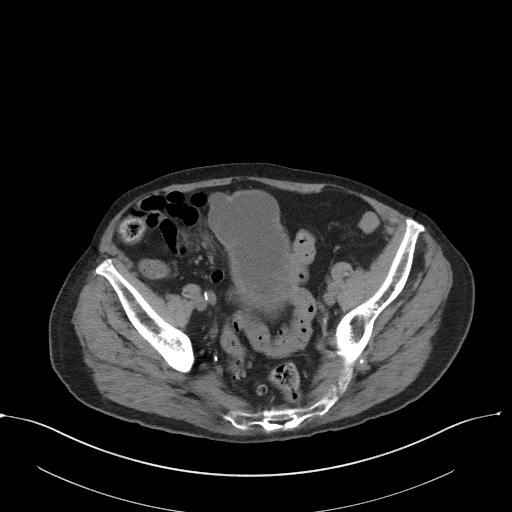
[im 45/99  soft-tissue]
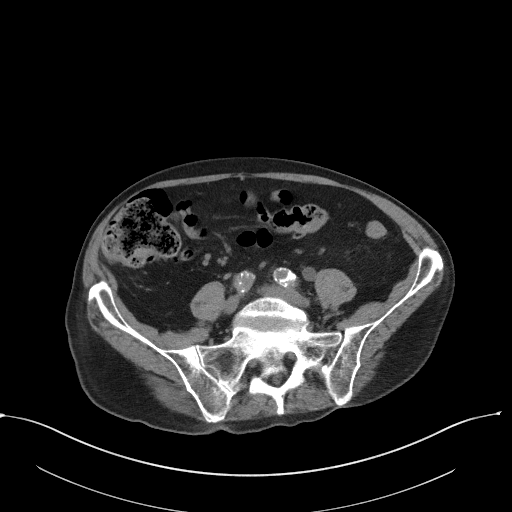
[im 50/99  soft-tissue]
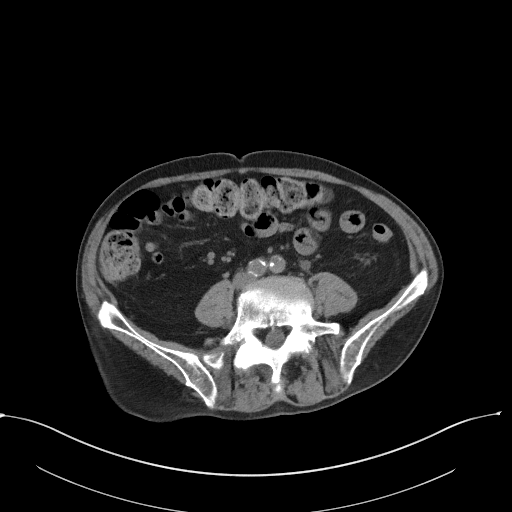
[im 54/99  soft-tissue]
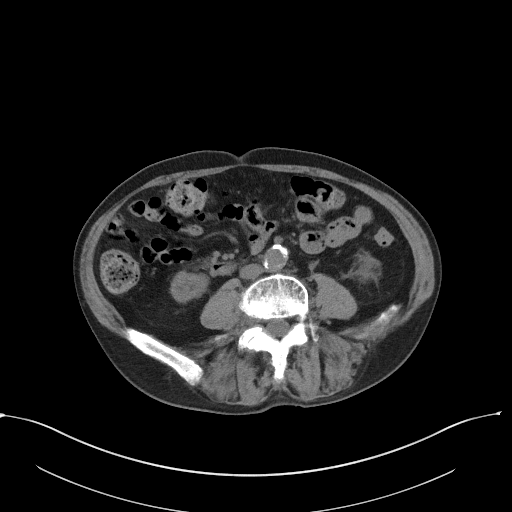
[im 64/99  soft-tissue]
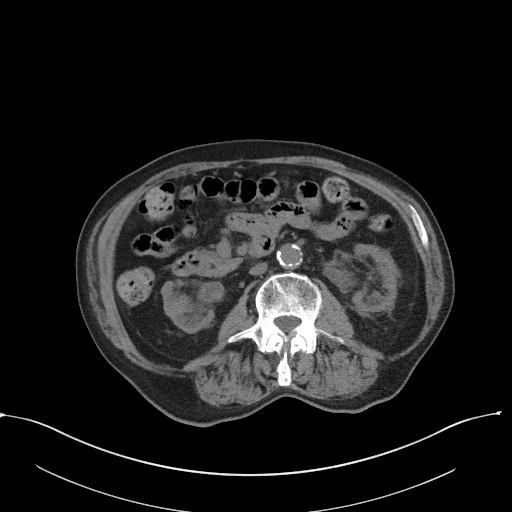
[im 64/99  bone]
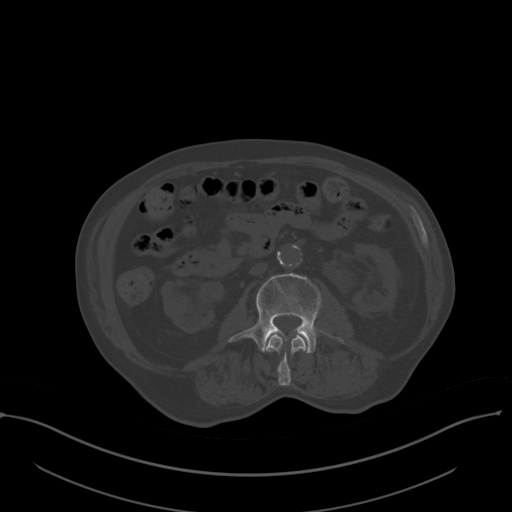
[im 69/99  soft-tissue]
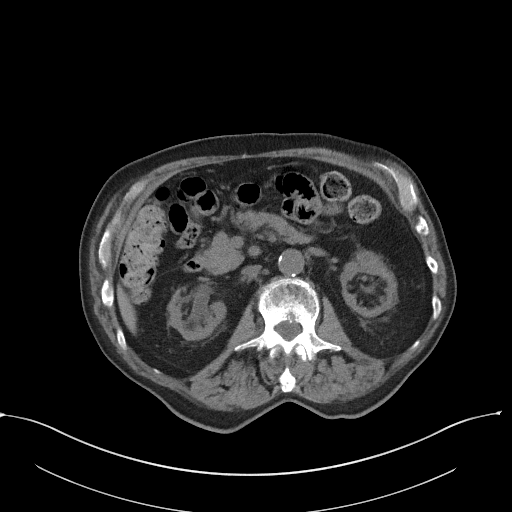
[im 79/99  soft-tissue]
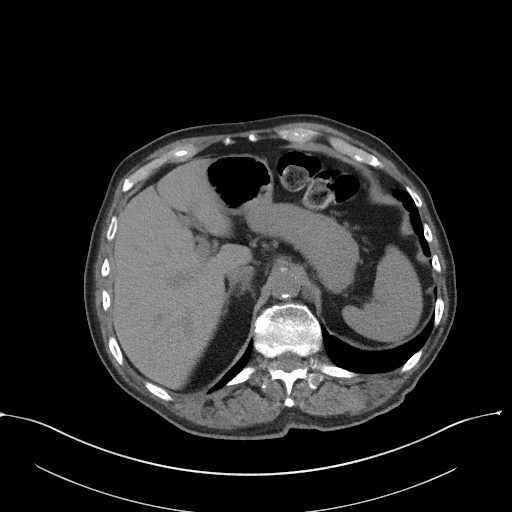
[im 84/99  soft-tissue]
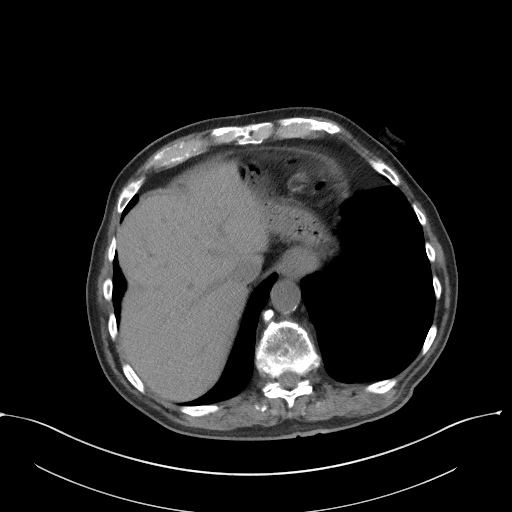
[im 94/99  soft-tissue]
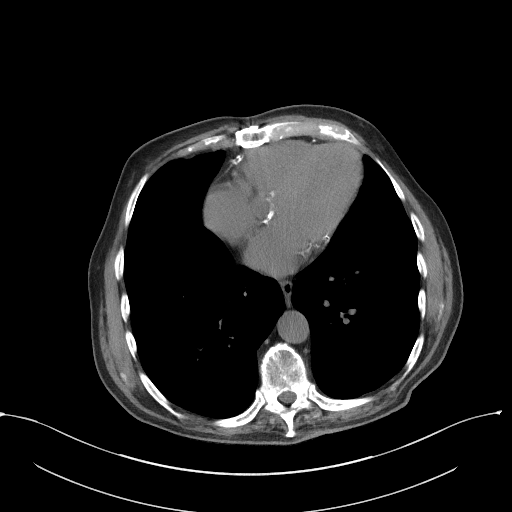

[Series 6: coronal st · coronal · 0.67mm/px · 3 of 79 slices shown]
[im 27/79  soft-tissue]
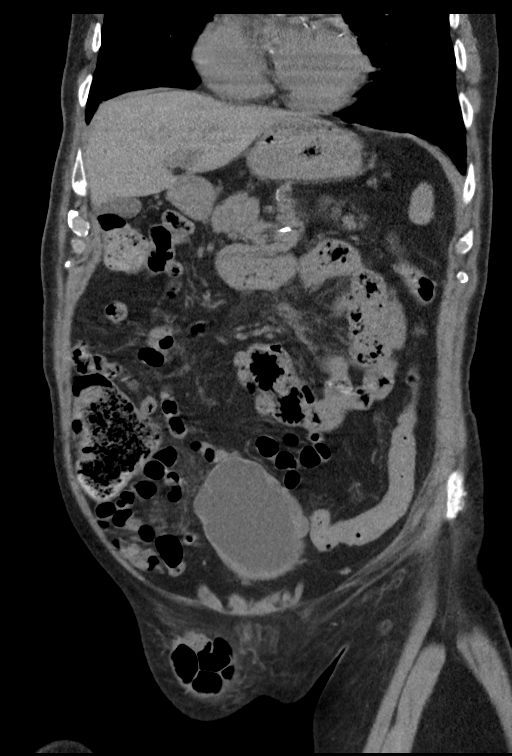
[im 35/79  soft-tissue]
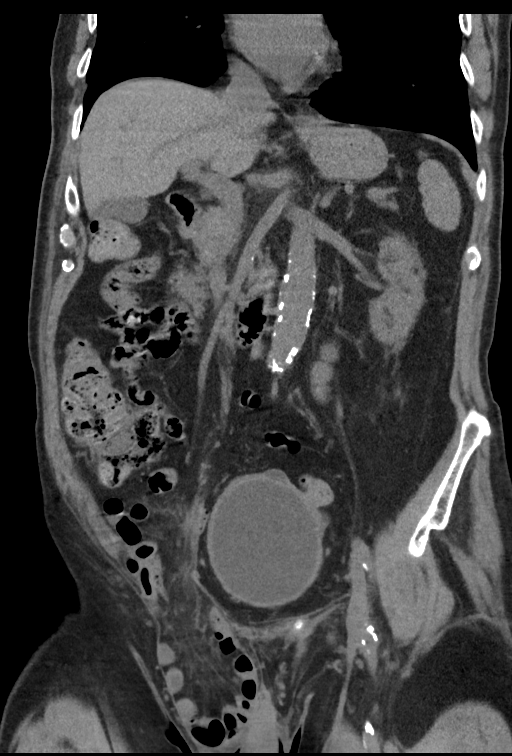
[im 44/79  soft-tissue]
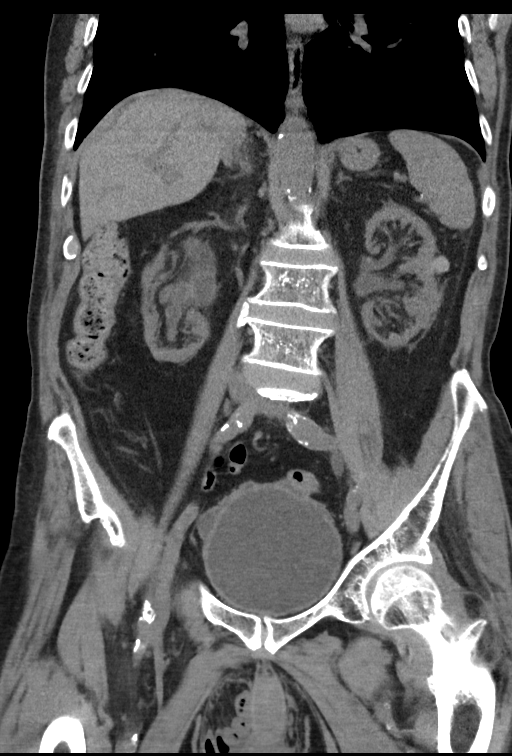

[16 of 46 positions shown; findings below may reference images not displayed]

FINDINGS: Lower chest: Advanced calcific atherosclerotic disease of the
coronary arteries.

Hepatobiliary: No focal liver abnormality is seen. No gallstones,
gallbladder wall thickening, or biliary dilatation.

Pancreas: Unremarkable. No pancreatic ductal dilatation or
surrounding inflammatory changes.

Spleen: Normal in size without focal abnormality.

Adrenals/Urinary Tract: Atrophic kidneys. Numerous bladder
diverticula.

Stomach/Bowel: Stomach is within normal limits. No evidence of bowel
wall thickening, distention, or inflammatory changes. Right indirect
inguinal hernia contains non incarcerated parts of distal ileum and
mesenteric fat.

Vascular/Lymphatic: Aortic atherosclerosis. No enlarged abdominal or
pelvic lymph nodes.

Reproductive: Enlarged prostate gland.

Other: Wide neck right indirect inguinal hernia with neck measuring
3.3 cm contains parts of the distal ileum. No evidence of
incarceration.

Musculoskeletal: Osteopenia. Osteoarthritic changes of the
lumbosacral spine.
IMPRESSION: Wide neck small bowel containing indirect right inguinal hernia.
Mild stranding of the herniated mesenteric without definite evidence
of incarceration.

Atrophic appearance of the kidneys.

Multiple urinary bladder diverticulae.

Enlarged prostate gland.

## 2017-07-22 ENCOUNTER — Encounter: Payer: Self-pay | Admitting: Oncology

## 2017-07-22 ENCOUNTER — Inpatient Hospital Stay: Payer: Medicare HMO

## 2017-07-22 ENCOUNTER — Other Ambulatory Visit: Payer: Self-pay

## 2017-07-22 ENCOUNTER — Other Ambulatory Visit: Payer: Self-pay | Admitting: Oncology

## 2017-07-22 ENCOUNTER — Encounter (INDEPENDENT_AMBULATORY_CARE_PROVIDER_SITE_OTHER): Payer: Self-pay

## 2017-07-22 ENCOUNTER — Inpatient Hospital Stay: Payer: Medicare HMO | Attending: Oncology | Admitting: Oncology

## 2017-07-22 VITALS — BP 159/54 | HR 75 | Temp 97.8°F | Wt 161.4 lb

## 2017-07-22 DIAGNOSIS — Z7984 Long term (current) use of oral hypoglycemic drugs: Secondary | ICD-10-CM

## 2017-07-22 DIAGNOSIS — Z7982 Long term (current) use of aspirin: Secondary | ICD-10-CM

## 2017-07-22 DIAGNOSIS — E785 Hyperlipidemia, unspecified: Secondary | ICD-10-CM | POA: Diagnosis not present

## 2017-07-22 DIAGNOSIS — R7989 Other specified abnormal findings of blood chemistry: Secondary | ICD-10-CM

## 2017-07-22 DIAGNOSIS — I2510 Atherosclerotic heart disease of native coronary artery without angina pectoris: Secondary | ICD-10-CM

## 2017-07-22 DIAGNOSIS — R531 Weakness: Secondary | ICD-10-CM

## 2017-07-22 DIAGNOSIS — N189 Chronic kidney disease, unspecified: Principal | ICD-10-CM

## 2017-07-22 DIAGNOSIS — N184 Chronic kidney disease, stage 4 (severe): Secondary | ICD-10-CM | POA: Diagnosis not present

## 2017-07-22 DIAGNOSIS — I129 Hypertensive chronic kidney disease with stage 1 through stage 4 chronic kidney disease, or unspecified chronic kidney disease: Secondary | ICD-10-CM

## 2017-07-22 DIAGNOSIS — R011 Cardiac murmur, unspecified: Secondary | ICD-10-CM

## 2017-07-22 DIAGNOSIS — D631 Anemia in chronic kidney disease: Secondary | ICD-10-CM | POA: Diagnosis not present

## 2017-07-22 DIAGNOSIS — D508 Other iron deficiency anemias: Secondary | ICD-10-CM

## 2017-07-22 DIAGNOSIS — Z79899 Other long term (current) drug therapy: Secondary | ICD-10-CM

## 2017-07-22 DIAGNOSIS — K4090 Unilateral inguinal hernia, without obstruction or gangrene, not specified as recurrent: Secondary | ICD-10-CM | POA: Diagnosis not present

## 2017-07-22 DIAGNOSIS — N400 Enlarged prostate without lower urinary tract symptoms: Secondary | ICD-10-CM | POA: Insufficient documentation

## 2017-07-22 DIAGNOSIS — E119 Type 2 diabetes mellitus without complications: Secondary | ICD-10-CM

## 2017-07-22 DIAGNOSIS — K219 Gastro-esophageal reflux disease without esophagitis: Secondary | ICD-10-CM | POA: Diagnosis not present

## 2017-07-22 DIAGNOSIS — R5383 Other fatigue: Secondary | ICD-10-CM

## 2017-07-22 DIAGNOSIS — Z87891 Personal history of nicotine dependence: Secondary | ICD-10-CM

## 2017-07-22 DIAGNOSIS — Z87442 Personal history of urinary calculi: Secondary | ICD-10-CM

## 2017-07-22 LAB — CBC WITH DIFFERENTIAL/PLATELET
Basophils Absolute: 0 10*3/uL (ref 0–0.1)
Basophils Relative: 1 %
Eosinophils Absolute: 0.2 10*3/uL (ref 0–0.7)
Eosinophils Relative: 3 %
HEMATOCRIT: 27.4 % — AB (ref 40.0–52.0)
Hemoglobin: 9.2 g/dL — ABNORMAL LOW (ref 13.0–18.0)
LYMPHS ABS: 1.5 10*3/uL (ref 1.0–3.6)
LYMPHS PCT: 23 %
MCH: 26.9 pg (ref 26.0–34.0)
MCHC: 33.5 g/dL (ref 32.0–36.0)
MCV: 80.4 fL (ref 80.0–100.0)
MONOS PCT: 9 %
Monocytes Absolute: 0.6 10*3/uL (ref 0.2–1.0)
NEUTROS ABS: 4.1 10*3/uL (ref 1.4–6.5)
Neutrophils Relative %: 64 %
Platelets: 184 10*3/uL (ref 150–440)
RBC: 3.4 MIL/uL — ABNORMAL LOW (ref 4.40–5.90)
RDW: 17.7 % — ABNORMAL HIGH (ref 11.5–14.5)
WBC: 6.4 10*3/uL (ref 3.8–10.6)

## 2017-07-22 LAB — TSH: TSH: 11.834 u[IU]/mL — AB (ref 0.350–4.500)

## 2017-07-22 LAB — RETICULOCYTES
RBC.: 3.54 MIL/uL — ABNORMAL LOW (ref 4.40–5.90)
RETIC COUNT ABSOLUTE: 35.4 10*3/uL (ref 19.0–183.0)
Retic Ct Pct: 1 % (ref 0.4–3.1)

## 2017-07-22 LAB — FOLATE: Folate: 19 ng/mL (ref >5.9)

## 2017-07-22 LAB — LACTATE DEHYDROGENASE: LDH: 206 U/L — AB (ref 98–192)

## 2017-07-22 LAB — VITAMIN B12: Vitamin B-12: 162 pg/mL — ABNORMAL LOW (ref 180–914)

## 2017-07-22 MED ORDER — FOLIC ACID 1 MG PO TABS: 1.0000 mg | ORAL_TABLET | Freq: Every day | ORAL | 3 refills | Status: DC

## 2017-07-22 NOTE — Progress Notes (Signed)
Patient here today as a new patient with anemia  

## 2017-07-22 NOTE — Progress Notes (Addendum)
[facility] Initial Visit:  Patient Care Team: Jay Athens, MD as PCP - General (Internal Medicine)  CHIEF COMPLAINTS/PURPOSE OF CONSULTATION: I have anemia  HISTORY OF PRESENTING ILLNESS: Jay Mathis 83 y.o. male is referred to [place] for evaluation and management of anemia.  Patient has recently established care with nephrologist Dr.Kulluru for CKD and was referred to [place] for anemia work up and treatment. Patient reports feeling fatigue, weakness. Denies chest pain, SOB, abdominal pain, weight loss. Denies any bleeding events. Denies blood in the stool or change of bowel habit. Noted black stool. He has inguinal hernia for which he has seen surgery and was not recommended for intervention.    Review of Systems  Constitutional: Positive for fatigue.  HENT:  Negative.   Eyes: Negative.   Respiratory: Negative.   Cardiovascular: Negative.   Gastrointestinal: Negative.   Endocrine: Negative.   Genitourinary: Negative.    Musculoskeletal: Negative.   Skin: Negative.   Hematological: Negative.   Psychiatric/Behavioral: Negative.     MEDICAL HISTORY: Past Medical History:  Diagnosis Date  . CAD (coronary artery disease)   . CKD (chronic kidney disease) stage 4, GFR 15-29 ml/min (HCC)   . Diabetes mellitus without complication ([place])   . GERD (gastroesophageal reflux disease)   . Hyperlipidemia   . Hypertension   . Nephrolithiasis     SURGICAL HISTORY: Past Surgical History:  Procedure Laterality Date  . APPENDECTOMY     60 plus years ago  . CORONARY ANGIOPLASTY WITH STENT PLACEMENT    . CORONARY ARTERY BYPASS GRAFT      SOCIAL HISTORY: Social History   Social History  . Marital status: Married    Spouse name: Jay Mathis  . Number of children: Jay Mathis  . Years of education: Jay Mathis   Occupational History  . Not on file.   Social History Main Topics  . Smoking status: Former Research scientist (life sciences)  . Smokeless tobacco: Never Used  . Alcohol use No  . Drug use: No  .  Sexual activity: Not on file   Other Topics Concern  . Not on file   Social History Narrative  . No narrative on file    FAMILY HISTORY No family history on file.  ALLERGIES:  is allergic to sulfa antibiotics.  MEDICATIONS:  Current Outpatient Prescriptions  Medication Sig Dispense Refill  . amLODipine (NORVASC) 5 MG tablet Take 1 tablet by mouth daily.    Marland Kitchen aspirin EC 81 MG tablet Take 81 mg by mouth daily.    Marland Kitchen atenolol (TENORMIN) 25 MG tablet Take 25 mg by mouth 1 day or 1 dose.    Marland Kitchen atorvastatin (LIPITOR) 10 MG tablet Take 10 mg by mouth at bedtime.     Marland Kitchen doxazosin (CARDURA) 8 MG tablet Take 8 mg by mouth daily.    . furosemide (LASIX) 20 MG tablet Take 20 mg by mouth daily.    Marland Kitchen glimepiride (AMARYL) 2 MG tablet Take 4 mg by mouth every evening.     . pantoprazole (PROTONIX) 40 MG tablet Take 40 mg by mouth 2 (two) times daily.    . tamsulosin (FLOMAX) 0.4 MG CAPS capsule Take 1 capsule by mouth daily.     No current facility-administered medications for this visit.     PHYSICAL EXAMINATION:  ECOG PERFORMANCE STATUS: 1 - Symptomatic but completely ambulatory   Vitals:   07/22/17 1022  BP: (!) 159/54  Pulse: 75  Temp: 97.8 F (36.6 C)    Filed Weights  07/22/17 1022  Weight: 161 lb 6 oz (73.2 kg)     Physical Exam GENERAL: No distress, well nourished.  SKIN:  No rashes or significant lesions  HEAD: Normocephalic, No masses, lesions, tenderness or abnormalities  EYES: Conjunctiva are pink, non icteric ENT: External ears normal ,lips , buccal mucosa, and tongue normal and mucous membranes are moist  LYMPH: No palpable cervical and axillary lymphadenopathy  LUNGS: Clear to auscultation, no crackles or wheezes HEART: Regular rate & rhythm, + systolic murmurs, no gallops, S1 normal and S2 normal  ABDOMEN: Abdomen soft, non-tender, normal bowel sounds, I did not appreciate any  masses or organomegaly  MUSCULOSKELETAL: No CVA tenderness and no tenderness on  percussion of the back or rib cage.  EXTREMITIES: No edema, no skin discoloration or tenderness NEURO: Alert & oriented, no focal motor/sensory deficits.   LABORATORY DATA: I have personally reviewed the data as listed:  No visits with results within 1 Month(s) from this visit.  Latest known visit with results is:  Admission on 08/04/2016, Discharged on 08/07/2016  Component Date Value Ref Range Status  . Sodium 08/04/2016 136  135 - 145 mmol/L Final  . Potassium 08/04/2016 3.9  3.5 - 5.1 mmol/L Final  . Chloride 08/04/2016 109  101 - 111 mmol/L Final  . CO2 08/04/2016 17* 22 - 32 mmol/L Final  . Glucose, Bld 08/04/2016 291* 65 - 99 mg/dL Final  . BUN 08/04/2016 49* 6 - 20 mg/dL Final  . Creatinine, Ser 08/04/2016 3.45* 0.61 - 1.24 mg/dL Final  . Calcium 08/04/2016 8.5* 8.9 - 10.3 mg/dL Final  . GFR calc non Af Amer 08/04/2016 14* >60 mL/min Final  . GFR calc Af Amer 08/04/2016 17* >60 mL/min Final   Comment: (NOTE) The eGFR has been calculated using the CKD EPI equation. This calculation has not been validated in all clinical situations. eGFR's persistently <60 mL/min signify possible Chronic Kidney Disease.   . Anion gap 08/04/2016 10  5 - 15 Final  . WBC 08/04/2016 6.2  3.8 - 10.6 K/uL Final  . RBC 08/04/2016 3.18* 4.40 - 5.90 MIL/uL Final  . Hemoglobin 08/04/2016 8.8* 13.0 - 18.0 g/dL Final  . HCT 08/04/2016 26.4* 40.0 - 52.0 % Final  . MCV 08/04/2016 83.0  80.0 - 100.0 fL Final  . MCH 08/04/2016 27.8  26.0 - 34.0 pg Final  . MCHC 08/04/2016 33.5  32.0 - 36.0 g/dL Final  . RDW 08/04/2016 15.1* 11.5 - 14.5 % Final  . Platelets 08/04/2016 120* 150 - 440 K/uL Final  . Color, Urine 08/04/2016 YELLOW* YELLOW Final  . APPearance 08/04/2016 CLOUDY* CLEAR Final  . Glucose, UA 08/04/2016 NEGATIVE  NEGATIVE mg/dL Final  . Bilirubin Urine 08/04/2016 NEGATIVE  NEGATIVE Final  . Ketones, ur 08/04/2016 NEGATIVE  NEGATIVE mg/dL Final  . Specific Gravity, Urine 08/04/2016 1.008  1.005  - 1.030 Final  . Hgb urine dipstick 08/04/2016 3+* NEGATIVE Final  . pH 08/04/2016 5.0  5.0 - 8.0 Final  . Protein, ur 08/04/2016 30* NEGATIVE mg/dL Final  . Nitrite 08/04/2016 NEGATIVE  NEGATIVE Final  . Leukocytes, UA 08/04/2016 3+* NEGATIVE Final  . RBC / HPF 08/04/2016 TOO NUMEROUS TO COUNT  0 - 5 RBC/hpf Final  . WBC, UA 08/04/2016 TOO NUMEROUS TO COUNT  0 - 5 WBC/hpf Final  . Bacteria, UA 08/04/2016 MANY* NONE SEEN Final  . Squamous Epithelial / LPF 08/04/2016 0-5* NONE SEEN Final  . Glucose-Capillary 08/04/2016 328* 65 - 99 mg/dL Final  . Specimen  Description 08/04/2016 BLOOD LEFT HAND   Final  . Special Requests 08/04/2016 BOTTLES DRAWN AEROBIC AND ANAEROBIC 55C   Final  . Culture 08/04/2016 NO GROWTH 5 DAYS   Final  . Report Status 08/04/2016 08/09/2016 FINAL   Final  . Specimen Description 08/04/2016 BLOOD RIGHT FOREARM   Final  . Special Requests 08/04/2016 BOTTLES DRAWN AEROBIC AND ANAEROBIC 8CC   Final  . Culture 08/04/2016 NO GROWTH 5 DAYS   Final  . Report Status 08/04/2016 08/09/2016 FINAL   Final  . Specimen Description 08/04/2016 URINE, RANDOM   Final  . Special Requests 08/04/2016 NONE   Final  . Culture 08/04/2016 *  Final                   Value:<10,000 COLONIES/mL INSIGNIFICANT GROWTH Performed at [facility]   . Report Status 08/04/2016 08/06/2016 FINAL   Final  . Lactic Acid, Venous 08/04/2016 4.4* 0.5 - 1.9 mmol/L Final   CRITICAL RESULT CALLED TO, READ BACK BY AND VERIFIED WITH APRIL BRUMGARD AT 2028 08/04/16.PMH  . Lactic Acid, Venous 08/04/2016 2.5* 0.5 - 1.9 mmol/L Final   CRITICAL RESULT CALLED TO, READ BACK BY AND VERIFIED WITH APRIL BRUMGARD AT 2320 08/04/16.PMH  . pH, Ven 08/04/2016 7.35  7.320 - 7.430 Final  . pCO2, Ven 08/04/2016 31* 44.0 - 60.0 mmHg Final  . pO2, Ven 08/04/2016 49.0* 31.0 - 45.0 mmHg Final  . Bicarbonate 08/04/2016 17.1* 21.0 - 28.0 mEq/L Final  . Acid-base deficit 08/04/2016 7.6* 0.0 - 2.0 mmol/L Final  . O2 Saturation  08/04/2016 82.0  % Final  . Patient temperature 08/04/2016 37.0   Final  . Collection site 08/04/2016 LINE   Final  . Sample type 08/04/2016 VENOUS   Final  . Total Protein 08/04/2016 6.5  6.5 - 8.1 g/dL Final  . Albumin 08/04/2016 3.5  3.5 - 5.0 g/dL Final  . AST 08/04/2016 28  15 - 41 U/L Final  . ALT 08/04/2016 9* 17 - 63 U/L Final  . Alkaline Phosphatase 08/04/2016 78  38 - 126 U/L Final  . Total Bilirubin 08/04/2016 0.2* 0.3 - 1.2 mg/dL Final  . Bilirubin, Direct 08/04/2016 <0.1* 0.1 - 0.5 mg/dL Final  . Indirect Bilirubin 08/04/2016 NOT CALCULATED  0.3 - 0.9 mg/dL Final  . Hgb A1c MFr Bld 08/05/2016 7.4* 4.0 - 6.0 % Final  . Sodium 08/05/2016 136  135 - 145 mmol/L Final  . Potassium 08/05/2016 3.9  3.5 - 5.1 mmol/L Final  . Chloride 08/05/2016 112* 101 - 111 mmol/L Final  . CO2 08/05/2016 18* 22 - 32 mmol/L Final  . Glucose, Bld 08/05/2016 147* 65 - 99 mg/dL Final  . BUN 08/05/2016 49* 6 - 20 mg/dL Final  . Creatinine, Ser 08/05/2016 3.13* 0.61 - 1.24 mg/dL Final  . Calcium 08/05/2016 8.1* 8.9 - 10.3 mg/dL Final  . GFR calc non Af Amer 08/05/2016 16* >60 mL/min Final  . GFR calc Af Amer 08/05/2016 19* >60 mL/min Final   Comment: (NOTE) The eGFR has been calculated using the CKD EPI equation. This calculation has not been validated in all clinical situations. eGFR's persistently <60 mL/min signify possible Chronic Kidney Disease.   . Anion gap 08/05/2016 6  5 - 15 Final  . WBC 08/05/2016 15.8* 3.8 - 10.6 K/uL Final  . RBC 08/05/2016 3.16* 4.40 - 5.90 MIL/uL Final  . Hemoglobin 08/05/2016 8.5* 13.0 - 18.0 g/dL Final  . HCT 08/05/2016 25.9* 40.0 - 52.0 % Final  .  MCV 08/05/2016 82.0  80.0 - 100.0 fL Final  . MCH 08/05/2016 27.0  26.0 - 34.0 pg Final  . MCHC 08/05/2016 32.9  32.0 - 36.0 g/dL Final  . RDW 08/05/2016 15.0* 11.5 - 14.5 % Final  . Platelets 08/05/2016 118* 150 - 440 K/uL Final  . Lactic Acid, Venous 08/05/2016 2.0* 0.5 - 1.9 mmol/L Final   CRITICAL RESULT CALLED  TO, READ BACK BY AND VERIFIED WITH LAUREN HOBBS AT 0310 08/05/16.PMH  . Glucose-Capillary 08/05/2016 81  65 - 99 mg/dL Final  . Glucose-Capillary 08/05/2016 213* 65 - 99 mg/dL Final  . WBC 08/06/2016 16.0* 3.8 - 10.6 K/uL Final  . RBC 08/06/2016 3.12* 4.40 - 5.90 MIL/uL Final  . Hemoglobin 08/06/2016 8.4* 13.0 - 18.0 g/dL Final  . HCT 08/06/2016 25.4* 40.0 - 52.0 % Final  . MCV 08/06/2016 81.5  80.0 - 100.0 fL Final  . MCH 08/06/2016 27.0  26.0 - 34.0 pg Final  . MCHC 08/06/2016 33.1  32.0 - 36.0 g/dL Final  . RDW 08/06/2016 14.6* 11.5 - 14.5 % Final  . Platelets 08/06/2016 110* 150 - 440 K/uL Final  . Sodium 08/06/2016 135  135 - 145 mmol/L Final  . Potassium 08/06/2016 4.7  3.5 - 5.1 mmol/L Final  . Chloride 08/06/2016 109  101 - 111 mmol/L Final  . CO2 08/06/2016 19* 22 - 32 mmol/L Final  . Glucose, Bld 08/06/2016 106* 65 - 99 mg/dL Final  . BUN 08/06/2016 50* 6 - 20 mg/dL Final  . Creatinine, Ser 08/06/2016 3.08* 0.61 - 1.24 mg/dL Final  . Calcium 08/06/2016 7.9* 8.9 - 10.3 mg/dL Final  . GFR calc non Af Amer 08/06/2016 17* >60 mL/min Final  . GFR calc Af Amer 08/06/2016 19* >60 mL/min Final   Comment: (NOTE) The eGFR has been calculated using the CKD EPI equation. This calculation has not been validated in all clinical situations. eGFR's persistently <60 mL/min signify possible Chronic Kidney Disease.   . Anion gap 08/06/2016 7  5 - 15 Final  . Glucose-Capillary 08/05/2016 100* 65 - 99 mg/dL Final  . Comment 1 08/05/2016 Notify RN   Final  . Glucose-Capillary 08/05/2016 102* 65 - 99 mg/dL Final  . Glucose-Capillary 08/06/2016 110* 65 - 99 mg/dL Final  . Comment 1 08/06/2016 Notify RN   Final  . Glucose-Capillary 08/06/2016 161* 65 - 99 mg/dL Final  . Comment 1 08/06/2016 Notify RN   Final  . Glucose-Capillary 08/06/2016 111* 65 - 99 mg/dL Final  . Comment 1 08/06/2016 Notify RN   Final  . WBC 08/07/2016 12.9* 3.8 - 10.6 K/uL Final  . RBC 08/07/2016 2.90* 4.40 - 5.90 MIL/uL  Final  . Hemoglobin 08/07/2016 8.0* 13.0 - 18.0 g/dL Final  . HCT 08/07/2016 23.8* 40.0 - 52.0 % Final  . MCV 08/07/2016 82.1  80.0 - 100.0 fL Final  . MCH 08/07/2016 27.8  26.0 - 34.0 pg Final  . MCHC 08/07/2016 33.8  32.0 - 36.0 g/dL Final  . RDW 08/07/2016 14.9* 11.5 - 14.5 % Final  . Platelets 08/07/2016 107* 150 - 440 K/uL Final  . Sodium 08/07/2016 138  135 - 145 mmol/L Final  . Potassium 08/07/2016 4.6  3.5 - 5.1 mmol/L Final  . Chloride 08/07/2016 113* 101 - 111 mmol/L Final  . CO2 08/07/2016 20* 22 - 32 mmol/L Final  . Glucose, Bld 08/07/2016 100* 65 - 99 mg/dL Final  . BUN 08/07/2016 48* 6 - 20 mg/dL Final  . Creatinine, Ser 08/07/2016  2.70* 0.61 - 1.24 mg/dL Final  . Calcium 08/07/2016 8.0* 8.9 - 10.3 mg/dL Final  . GFR calc non Af Amer 08/07/2016 19* >60 mL/min Final  . GFR calc Af Amer 08/07/2016 23* >60 mL/min Final   Comment: (NOTE) The eGFR has been calculated using the CKD EPI equation. This calculation has not been validated in all clinical situations. eGFR's persistently <60 mL/min signify possible Chronic Kidney Disease.   . Anion gap 08/07/2016 5  5 - 15 Final  . Glucose-Capillary 08/06/2016 168* 65 - 99 mg/dL Final  . Glucose-Capillary 08/07/2016 111* 65 - 99 mg/dL Final  . Glucose-Capillary 08/07/2016 143* 65 - 99 mg/dL Final   [facility] results.  07/08/2017 Iron 81, TIBC 298, Saturation 27%, Ferritin 57  RADIOGRAPHIC STUDIES: I have personally reviewed the radiological images as listed and agree with the findings in the report 06/10/2017 CT abdomen pelvis IMPRESSION: Wide neck small bowel containing indirect right inguinal hernia. Mild stranding of the herniated mesenteric without definite evidence of incarceration.  Atrophic appearance of the kidneys.  Multiple urinary bladder diverticulae. ASSESSMENT/PLAN  1. CKD (chronic kidney disease) stage 4, GFR 15-29 ml/min (HCC)   2. Anemia in stage 4 chronic kidney disease (HCC)   3. Other iron deficiency  anemia   4. Heart murmur   . Anemia: likely due to his CKD, and borderline iron deficiency.  Will rule out other etiologies. including chronic blood loss, hyper/hypothyroidism, nutritional deficiency, monoclonal gammopathy.  His systolic murmur may reflecting aortic stenosis, which may contribute to destruction of his RBCs. Will check CBC w differential, CMP, vitamin B12, Folate,, reticulocytes, fecal occult, TSH,  LDH, haptoglobin, monoclonal gammopathy evaluation.  Haptoglobin.   Anemia of CKD, discussed about Epo treatment in the future if his Hb is lower than 10g after iron repletion. Plan. IV iron with feraheme 517m IV weekly x 2 doses. Rare severe allergic reactions with FShirlean Kellywas discussed with patient and his son. Patient agrees with the plan and is willing to proceed with treatment.   Recommend taking Folate supplement 150mdaily. Rx sent to pharmacy.  F/u 1 week after second dose of Feraheme. Repeat labs.   Orders Placed This Encounter  Procedures  . CBC with Differential/Platelet    Standing Status:   Future    Number of Occurrences:   1    Standing Expiration Date:   07/22/2018  . Reticulocytes    Standing Status:   Future    Number of Occurrences:   1    Standing Expiration Date:   07/22/2018  . Folate    Standing Status:   Future    Number of Occurrences:   1    Standing Expiration Date:   07/22/2018  . Vitamin B12    Standing Status:   Future    Number of Occurrences:   1    Standing Expiration Date:   07/22/2018  . Serum protein electrophoresis with reflex(will reflex immunofixation)    Standing Status:   Future    Number of Occurrences:   1    Standing Expiration Date:   07/22/2018  . Kappa/lambda light chains    Standing Status:   Future    Number of Occurrences:   1    Standing Expiration Date:   07/22/2018  . TSH    Standing Status:   Future    Number of Occurrences:   1    Standing Expiration Date:   07/22/2018  . Lactate dehydrogenase    Standing Status:  Future    Number of Occurrences:   1    Standing Expiration Date:   07/22/2018  . Occult blood card to lab, stool    Standing Status:   Future    Standing Expiration Date:   07/22/2018  . Occult blood card to lab, stool    Standing Status:   Future    Standing Expiration Date:   07/22/2018  . Occult blood card to lab, stool    Standing Status:   Future    Standing Expiration Date:   07/22/2018  . Haptoglobin    Standing Status:   Future    Number of Occurrences:   1    Standing Expiration Date:   07/22/2018  . CBC with Differential/Platelet    Standing Status:   Future    Standing Expiration Date:   07/22/2018  . Iron and TIBC    Standing Status:   Future    Standing Expiration Date:   07/22/2018  . Ferritin    Standing Status:   Future    Standing Expiration Date:   07/22/2018    All questions were answered. The patient knows to call the clinic with any problems, questions or concerns.  This note was electronically signed.    Earlie Server, MD  07/22/2017 10:02 AM

## 2017-07-23 ENCOUNTER — Other Ambulatory Visit: Payer: Self-pay | Admitting: *Deleted

## 2017-07-23 DIAGNOSIS — E039 Hypothyroidism, unspecified: Secondary | ICD-10-CM

## 2017-07-23 LAB — PROTEIN ELECTROPHORESIS, SERUM
A/G Ratio: 1.1 (ref 0.7–1.7)
ALPHA-2-GLOBULIN: 0.9 g/dL (ref 0.4–1.0)
Albumin ELP: 3.6 g/dL (ref 2.9–4.4)
Alpha-1-Globulin: 0.3 g/dL (ref 0.0–0.4)
BETA GLOBULIN: 1 g/dL (ref 0.7–1.3)
GAMMA GLOBULIN: 0.9 g/dL (ref 0.4–1.8)
GLOBULIN, TOTAL: 3.2 g/dL (ref 2.2–3.9)
Total Protein ELP: 6.8 g/dL (ref 6.0–8.5)

## 2017-07-23 LAB — KAPPA/LAMBDA LIGHT CHAINS
KAPPA FREE LGHT CHN: 53 mg/L — AB (ref 3.3–19.4)
KAPPA, LAMDA LIGHT CHAIN RATIO: 1.24 (ref 0.26–1.65)
LAMDA FREE LIGHT CHAINS: 42.8 mg/L — AB (ref 5.7–26.3)

## 2017-07-23 LAB — HAPTOGLOBIN: Haptoglobin: 259 mg/dL — ABNORMAL HIGH (ref 34–200)

## 2017-07-23 NOTE — Progress Notes (Signed)
Open in error

## 2017-07-24 DIAGNOSIS — R531 Weakness: Secondary | ICD-10-CM | POA: Diagnosis not present

## 2017-07-24 DIAGNOSIS — E119 Type 2 diabetes mellitus without complications: Secondary | ICD-10-CM | POA: Diagnosis not present

## 2017-07-24 DIAGNOSIS — R5383 Other fatigue: Secondary | ICD-10-CM | POA: Diagnosis not present

## 2017-07-24 DIAGNOSIS — K4090 Unilateral inguinal hernia, without obstruction or gangrene, not specified as recurrent: Secondary | ICD-10-CM | POA: Diagnosis not present

## 2017-07-24 DIAGNOSIS — D631 Anemia in chronic kidney disease: Secondary | ICD-10-CM | POA: Diagnosis not present

## 2017-07-24 DIAGNOSIS — Z79899 Other long term (current) drug therapy: Secondary | ICD-10-CM | POA: Diagnosis not present

## 2017-07-24 DIAGNOSIS — I129 Hypertensive chronic kidney disease with stage 1 through stage 4 chronic kidney disease, or unspecified chronic kidney disease: Secondary | ICD-10-CM | POA: Diagnosis not present

## 2017-07-24 DIAGNOSIS — I2510 Atherosclerotic heart disease of native coronary artery without angina pectoris: Secondary | ICD-10-CM | POA: Diagnosis not present

## 2017-07-24 DIAGNOSIS — N184 Chronic kidney disease, stage 4 (severe): Secondary | ICD-10-CM | POA: Diagnosis not present

## 2017-07-25 DIAGNOSIS — R531 Weakness: Secondary | ICD-10-CM | POA: Diagnosis not present

## 2017-07-25 DIAGNOSIS — N184 Chronic kidney disease, stage 4 (severe): Secondary | ICD-10-CM | POA: Diagnosis not present

## 2017-07-25 DIAGNOSIS — E119 Type 2 diabetes mellitus without complications: Secondary | ICD-10-CM | POA: Diagnosis not present

## 2017-07-25 DIAGNOSIS — I2510 Atherosclerotic heart disease of native coronary artery without angina pectoris: Secondary | ICD-10-CM | POA: Diagnosis not present

## 2017-07-25 DIAGNOSIS — D631 Anemia in chronic kidney disease: Secondary | ICD-10-CM | POA: Diagnosis not present

## 2017-07-25 DIAGNOSIS — Z79899 Other long term (current) drug therapy: Secondary | ICD-10-CM | POA: Diagnosis not present

## 2017-07-25 DIAGNOSIS — I129 Hypertensive chronic kidney disease with stage 1 through stage 4 chronic kidney disease, or unspecified chronic kidney disease: Secondary | ICD-10-CM | POA: Diagnosis not present

## 2017-07-25 DIAGNOSIS — K4090 Unilateral inguinal hernia, without obstruction or gangrene, not specified as recurrent: Secondary | ICD-10-CM | POA: Diagnosis not present

## 2017-07-25 DIAGNOSIS — R5383 Other fatigue: Secondary | ICD-10-CM | POA: Diagnosis not present

## 2017-07-26 DIAGNOSIS — E119 Type 2 diabetes mellitus without complications: Secondary | ICD-10-CM | POA: Diagnosis not present

## 2017-07-26 DIAGNOSIS — I129 Hypertensive chronic kidney disease with stage 1 through stage 4 chronic kidney disease, or unspecified chronic kidney disease: Secondary | ICD-10-CM | POA: Diagnosis not present

## 2017-07-26 DIAGNOSIS — N184 Chronic kidney disease, stage 4 (severe): Secondary | ICD-10-CM | POA: Diagnosis not present

## 2017-07-26 DIAGNOSIS — R531 Weakness: Secondary | ICD-10-CM | POA: Diagnosis not present

## 2017-07-26 DIAGNOSIS — D631 Anemia in chronic kidney disease: Secondary | ICD-10-CM | POA: Diagnosis not present

## 2017-07-26 DIAGNOSIS — K4090 Unilateral inguinal hernia, without obstruction or gangrene, not specified as recurrent: Secondary | ICD-10-CM | POA: Diagnosis not present

## 2017-07-26 DIAGNOSIS — I2510 Atherosclerotic heart disease of native coronary artery without angina pectoris: Secondary | ICD-10-CM | POA: Diagnosis not present

## 2017-07-26 DIAGNOSIS — R5383 Other fatigue: Secondary | ICD-10-CM | POA: Diagnosis not present

## 2017-07-26 DIAGNOSIS — Z79899 Other long term (current) drug therapy: Secondary | ICD-10-CM | POA: Diagnosis not present

## 2017-07-30 ENCOUNTER — Other Ambulatory Visit: Payer: Self-pay | Admitting: Oncology

## 2017-07-30 ENCOUNTER — Inpatient Hospital Stay: Payer: Medicare HMO

## 2017-07-30 ENCOUNTER — Other Ambulatory Visit: Payer: Self-pay

## 2017-07-30 ENCOUNTER — Encounter: Payer: Self-pay | Admitting: Oncology

## 2017-07-30 VITALS — BP 148/55 | HR 77 | Temp 97.7°F

## 2017-07-30 DIAGNOSIS — N184 Chronic kidney disease, stage 4 (severe): Secondary | ICD-10-CM | POA: Diagnosis not present

## 2017-07-30 DIAGNOSIS — E538 Deficiency of other specified B group vitamins: Secondary | ICD-10-CM

## 2017-07-30 DIAGNOSIS — R531 Weakness: Secondary | ICD-10-CM | POA: Diagnosis not present

## 2017-07-30 DIAGNOSIS — I129 Hypertensive chronic kidney disease with stage 1 through stage 4 chronic kidney disease, or unspecified chronic kidney disease: Secondary | ICD-10-CM | POA: Diagnosis not present

## 2017-07-30 DIAGNOSIS — K4090 Unilateral inguinal hernia, without obstruction or gangrene, not specified as recurrent: Secondary | ICD-10-CM | POA: Diagnosis not present

## 2017-07-30 DIAGNOSIS — E119 Type 2 diabetes mellitus without complications: Secondary | ICD-10-CM | POA: Diagnosis not present

## 2017-07-30 DIAGNOSIS — I2510 Atherosclerotic heart disease of native coronary artery without angina pectoris: Secondary | ICD-10-CM | POA: Diagnosis not present

## 2017-07-30 DIAGNOSIS — D631 Anemia in chronic kidney disease: Secondary | ICD-10-CM | POA: Diagnosis not present

## 2017-07-30 DIAGNOSIS — R5383 Other fatigue: Secondary | ICD-10-CM | POA: Diagnosis not present

## 2017-07-30 DIAGNOSIS — Z79899 Other long term (current) drug therapy: Secondary | ICD-10-CM | POA: Diagnosis not present

## 2017-07-30 DIAGNOSIS — D509 Iron deficiency anemia, unspecified: Secondary | ICD-10-CM

## 2017-07-30 HISTORY — DX: Iron deficiency anemia, unspecified: D50.9

## 2017-07-30 LAB — METHYLMALONIC ACID, SERUM: Methylmalonic Acid, Quantitative: 571 nmol/L — ABNORMAL HIGH (ref 0–378)

## 2017-07-30 LAB — OCCULT BLOOD X 1 CARD TO LAB, STOOL
FECAL OCCULT BLD: NEGATIVE
Fecal Occult Bld: NEGATIVE
Fecal Occult Bld: NEGATIVE

## 2017-07-30 MED ORDER — SODIUM CHLORIDE 0.9 % IV SOLN
Freq: Once | INTRAVENOUS | Status: AC
  Administered 2017-07-30: 14:00:00 via INTRAVENOUS
  Filled 2017-07-30: qty 1000

## 2017-07-30 MED ORDER — SODIUM CHLORIDE 0.9 % IV SOLN
510.0000 mg | Freq: Once | INTRAVENOUS | Status: AC
Start: 2017-07-30 — End: 2017-07-30
  Administered 2017-07-30: 510 mg via INTRAVENOUS
  Filled 2017-07-30: qty 17

## 2017-08-06 ENCOUNTER — Inpatient Hospital Stay: Payer: Medicare HMO

## 2017-08-06 VITALS — BP 150/57 | HR 71 | Temp 98.7°F | Resp 18

## 2017-08-06 DIAGNOSIS — Z79899 Other long term (current) drug therapy: Secondary | ICD-10-CM | POA: Diagnosis not present

## 2017-08-06 DIAGNOSIS — D631 Anemia in chronic kidney disease: Secondary | ICD-10-CM

## 2017-08-06 DIAGNOSIS — R5383 Other fatigue: Secondary | ICD-10-CM | POA: Diagnosis not present

## 2017-08-06 DIAGNOSIS — R531 Weakness: Secondary | ICD-10-CM | POA: Diagnosis not present

## 2017-08-06 DIAGNOSIS — E119 Type 2 diabetes mellitus without complications: Secondary | ICD-10-CM | POA: Diagnosis not present

## 2017-08-06 DIAGNOSIS — E538 Deficiency of other specified B group vitamins: Secondary | ICD-10-CM

## 2017-08-06 DIAGNOSIS — D509 Iron deficiency anemia, unspecified: Secondary | ICD-10-CM

## 2017-08-06 DIAGNOSIS — N184 Chronic kidney disease, stage 4 (severe): Secondary | ICD-10-CM | POA: Diagnosis not present

## 2017-08-06 DIAGNOSIS — I2510 Atherosclerotic heart disease of native coronary artery without angina pectoris: Secondary | ICD-10-CM | POA: Diagnosis not present

## 2017-08-06 DIAGNOSIS — I129 Hypertensive chronic kidney disease with stage 1 through stage 4 chronic kidney disease, or unspecified chronic kidney disease: Secondary | ICD-10-CM | POA: Diagnosis not present

## 2017-08-06 DIAGNOSIS — K4090 Unilateral inguinal hernia, without obstruction or gangrene, not specified as recurrent: Secondary | ICD-10-CM | POA: Diagnosis not present

## 2017-08-06 LAB — CBC WITH DIFFERENTIAL/PLATELET
Basophils Absolute: 0 10*3/uL (ref 0–0.1)
Basophils Relative: 1 %
EOS ABS: 0.3 10*3/uL (ref 0–0.7)
EOS PCT: 4 %
HCT: 25.4 % — ABNORMAL LOW (ref 40.0–52.0)
Hemoglobin: 8.6 g/dL — ABNORMAL LOW (ref 13.0–18.0)
LYMPHS ABS: 1.9 10*3/uL (ref 1.0–3.6)
Lymphocytes Relative: 29 %
MCH: 27.4 pg (ref 26.0–34.0)
MCHC: 33.8 g/dL (ref 32.0–36.0)
MCV: 81.2 fL (ref 80.0–100.0)
Monocytes Absolute: 0.6 10*3/uL (ref 0.2–1.0)
Monocytes Relative: 9 %
Neutro Abs: 3.8 10*3/uL (ref 1.4–6.5)
Neutrophils Relative %: 57 %
PLATELETS: 214 10*3/uL (ref 150–440)
RBC: 3.13 MIL/uL — AB (ref 4.40–5.90)
RDW: 15.9 % — ABNORMAL HIGH (ref 11.5–14.5)
WBC: 6.6 10*3/uL (ref 3.8–10.6)

## 2017-08-06 LAB — IRON AND TIBC
IRON: 60 ug/dL (ref 45–182)
Saturation Ratios: 20 % (ref 17.9–39.5)
TIBC: 295 ug/dL (ref 250–450)
UIBC: 235 ug/dL

## 2017-08-06 LAB — FERRITIN: Ferritin: 374 ng/mL — ABNORMAL HIGH (ref 24–336)

## 2017-08-06 MED ORDER — SODIUM CHLORIDE 0.9 % IV SOLN
510.0000 mg | Freq: Once | INTRAVENOUS | Status: AC
  Administered 2017-08-06: 510 mg via INTRAVENOUS
  Filled 2017-08-06: qty 17

## 2017-08-06 MED ORDER — CYANOCOBALAMIN 1000 MCG/ML IJ SOLN
1000.0000 ug | Freq: Once | INTRAMUSCULAR | Status: AC
  Administered 2017-08-06: 1000 ug via INTRAMUSCULAR
  Filled 2017-08-06: qty 1

## 2017-08-06 MED ORDER — SODIUM CHLORIDE 0.9 % IV SOLN
Freq: Once | INTRAVENOUS | Status: AC
  Administered 2017-08-06: 14:00:00 via INTRAVENOUS
  Filled 2017-08-06: qty 1000

## 2017-08-07 LAB — INTRINSIC FACTOR ANTIBODIES: INTRINSIC FACTOR: 1 [AU]/ml (ref 0.0–1.1)

## 2017-08-07 LAB — ANTI-PARIETAL ANTIBODY: PARIETAL CELL ANTIBODY-IGG: 4.1 U (ref 0.0–20.0)

## 2017-08-12 ENCOUNTER — Other Ambulatory Visit: Payer: Self-pay | Admitting: Oncology

## 2017-08-13 ENCOUNTER — Inpatient Hospital Stay: Payer: Medicare HMO

## 2017-08-13 ENCOUNTER — Encounter: Payer: Self-pay | Admitting: Oncology

## 2017-08-13 ENCOUNTER — Inpatient Hospital Stay: Payer: Medicare HMO | Attending: Oncology | Admitting: Oncology

## 2017-08-13 ENCOUNTER — Ambulatory Visit
Admission: RE | Admit: 2017-08-13 | Discharge: 2017-08-13 | Disposition: A | Discharge status: Home / Self Care | Payer: Medicare HMO | Source: Ambulatory Visit | Attending: Oncology | Admitting: Oncology

## 2017-08-13 VITALS — BP 167/57 | HR 75 | Temp 97.1°F | Wt 159.0 lb

## 2017-08-13 DIAGNOSIS — K4090 Unilateral inguinal hernia, without obstruction or gangrene, not specified as recurrent: Secondary | ICD-10-CM | POA: Insufficient documentation

## 2017-08-13 DIAGNOSIS — N184 Chronic kidney disease, stage 4 (severe): Secondary | ICD-10-CM

## 2017-08-13 DIAGNOSIS — I129 Hypertensive chronic kidney disease with stage 1 through stage 4 chronic kidney disease, or unspecified chronic kidney disease: Secondary | ICD-10-CM | POA: Insufficient documentation

## 2017-08-13 DIAGNOSIS — D509 Iron deficiency anemia, unspecified: Secondary | ICD-10-CM

## 2017-08-13 DIAGNOSIS — I2510 Atherosclerotic heart disease of native coronary artery without angina pectoris: Secondary | ICD-10-CM | POA: Diagnosis not present

## 2017-08-13 DIAGNOSIS — D631 Anemia in chronic kidney disease: Secondary | ICD-10-CM | POA: Diagnosis not present

## 2017-08-13 DIAGNOSIS — R531 Weakness: Secondary | ICD-10-CM | POA: Insufficient documentation

## 2017-08-13 DIAGNOSIS — R5383 Other fatigue: Secondary | ICD-10-CM | POA: Diagnosis not present

## 2017-08-13 DIAGNOSIS — Z7984 Long term (current) use of oral hypoglycemic drugs: Secondary | ICD-10-CM | POA: Diagnosis not present

## 2017-08-13 DIAGNOSIS — Z87442 Personal history of urinary calculi: Secondary | ICD-10-CM | POA: Diagnosis not present

## 2017-08-13 DIAGNOSIS — Z7982 Long term (current) use of aspirin: Secondary | ICD-10-CM | POA: Insufficient documentation

## 2017-08-13 DIAGNOSIS — Z87891 Personal history of nicotine dependence: Secondary | ICD-10-CM | POA: Diagnosis not present

## 2017-08-13 DIAGNOSIS — Z79899 Other long term (current) drug therapy: Secondary | ICD-10-CM | POA: Diagnosis not present

## 2017-08-13 DIAGNOSIS — D519 Vitamin B12 deficiency anemia, unspecified: Secondary | ICD-10-CM

## 2017-08-13 DIAGNOSIS — K219 Gastro-esophageal reflux disease without esophagitis: Secondary | ICD-10-CM | POA: Diagnosis not present

## 2017-08-13 DIAGNOSIS — M7989 Other specified soft tissue disorders: Secondary | ICD-10-CM | POA: Insufficient documentation

## 2017-08-13 DIAGNOSIS — E785 Hyperlipidemia, unspecified: Secondary | ICD-10-CM | POA: Insufficient documentation

## 2017-08-13 DIAGNOSIS — Z8042 Family history of malignant neoplasm of prostate: Secondary | ICD-10-CM | POA: Insufficient documentation

## 2017-08-13 DIAGNOSIS — E538 Deficiency of other specified B group vitamins: Secondary | ICD-10-CM

## 2017-08-13 DIAGNOSIS — E119 Type 2 diabetes mellitus without complications: Secondary | ICD-10-CM | POA: Diagnosis not present

## 2017-08-13 DIAGNOSIS — R011 Cardiac murmur, unspecified: Secondary | ICD-10-CM

## 2017-08-13 DIAGNOSIS — R600 Localized edema: Secondary | ICD-10-CM | POA: Diagnosis not present

## 2017-08-13 LAB — COMPREHENSIVE METABOLIC PANEL
ALT: 21 U/L (ref 17–63)
AST: 21 U/L (ref 15–41)
Albumin: 3.6 g/dL (ref 3.5–5.0)
Alkaline Phosphatase: 84 U/L (ref 38–126)
Anion gap: 9 (ref 5–15)
BUN: 36 mg/dL — ABNORMAL HIGH (ref 6–20)
CO2: 25 mmol/L (ref 22–32)
Calcium: 8.7 mg/dL — ABNORMAL LOW (ref 8.9–10.3)
Chloride: 102 mmol/L (ref 101–111)
Creatinine, Ser: 2.51 mg/dL — ABNORMAL HIGH (ref 0.61–1.24)
GFR calc Af Amer: 24 mL/min — ABNORMAL LOW (ref >60)
GFR calc non Af Amer: 21 mL/min — ABNORMAL LOW (ref >60)
Glucose, Bld: 190 mg/dL — ABNORMAL HIGH (ref 65–99)
Potassium: 3.8 mmol/L (ref 3.5–5.1)
Sodium: 136 mmol/L (ref 135–145)
Total Bilirubin: 0.4 mg/dL (ref 0.3–1.2)
Total Protein: 6.8 g/dL (ref 6.5–8.1)

## 2017-08-13 LAB — CBC WITH DIFFERENTIAL/PLATELET
Basophils Absolute: 0 10*3/uL (ref 0–0.1)
Basophils Relative: 1 %
Eosinophils Absolute: 0.3 10*3/uL (ref 0–0.7)
Eosinophils Relative: 5 %
HEMATOCRIT: 21.7 % — AB (ref 40.0–52.0)
HEMOGLOBIN: 7.3 g/dL — AB (ref 13.0–18.0)
LYMPHS ABS: 1.4 10*3/uL (ref 1.0–3.6)
LYMPHS PCT: 24 %
MCH: 28.2 pg (ref 26.0–34.0)
MCHC: 33.7 g/dL (ref 32.0–36.0)
MCV: 83.7 fL (ref 80.0–100.0)
MONOS PCT: 10 %
Monocytes Absolute: 0.6 10*3/uL (ref 0.2–1.0)
NEUTROS PCT: 60 %
Neutro Abs: 3.6 10*3/uL (ref 1.4–6.5)
Platelets: 203 10*3/uL (ref 150–440)
RBC: 2.59 MIL/uL — AB (ref 4.40–5.90)
RDW: 16.5 % — ABNORMAL HIGH (ref 11.5–14.5)
WBC: 5.9 10*3/uL (ref 3.8–10.6)

## 2017-08-13 LAB — FERRITIN: Ferritin: 492 ng/mL — ABNORMAL HIGH (ref 24–336)

## 2017-08-13 LAB — IRON AND TIBC
IRON: 52 ug/dL (ref 45–182)
Saturation Ratios: 20 % (ref 17.9–39.5)
TIBC: 259 ug/dL (ref 250–450)
UIBC: 207 ug/dL

## 2017-08-13 MED ORDER — CYANOCOBALAMIN 1000 MCG/ML IJ SOLN
1000.0000 ug | Freq: Once | INTRAMUSCULAR | Status: AC
  Administered 2017-08-13: 1000 ug via INTRAMUSCULAR
  Filled 2017-08-13: qty 1

## 2017-08-13 NOTE — Progress Notes (Signed)
[facility] Initial Visit:  Patient Care Team: [name], MD as PCP - General (Internal Medicine)  CHIEF COMPLAINTS/PURPOSE OF CONSULTATION: I have anemia  HISTORY OF PRESENTING ILLNESS: Jay Mathis 83 y.o. male is referred to [place] for evaluation and management of anemia.  Patient has recently established care with nephrologist Dr.Kulluru for CKD and was referred to [place] for anemia work up and treatment. Patient reports feeling fatigue, weakness. Denies chest pain, SOB, abdominal pain, weight loss. Denies any bleeding events. Denies blood in the stool or change of bowel habit. Noted black stool. He has inguinal hernia for which he has seen surgery and was not recommended for intervention.   INTERVAL HISTORY Patient presents to discuss about the results. During the interval, he has received 2 doses of Feraheme, tolerated well and one dose of 1021mg B12 shot.  He feels his fatigue level remains the same, maybe slightly better. His son noticed that he has more fluid build up in his legs. Denies calf tenderness. Patient tells me that he used to take water pill which was discontinued as his kidney function declined.   Review of Systems  Constitutional: Positive for fatigue.  HENT:  Negative.   Eyes: Negative.   Respiratory: Negative.   Cardiovascular: Negative.   Gastrointestinal: Negative.   Endocrine: Negative.   Genitourinary: Negative.    Musculoskeletal: Negative.   Skin: Negative.   Hematological: Negative.   Psychiatric/Behavioral: Negative.     MEDICAL HISTORY: Past Medical History:  Diagnosis Date  . CAD (coronary artery disease)   . CKD (chronic kidney disease) stage 4, GFR 15-29 ml/min (HCC)   . Diabetes mellitus without complication ([place]   . GERD (gastroesophageal reflux disease)   . Hyperlipidemia   . Hypertension   . Iron deficiency anemia 07/30/2017  . Nephrolithiasis     SURGICAL HISTORY: Past Surgical History:  Procedure Laterality  Date  . APPENDECTOMY     60 plus years ago  . CORONARY ANGIOPLASTY WITH STENT PLACEMENT    . CORONARY ARTERY BYPASS GRAFT      SOCIAL HISTORY: Social History   Social History  . Marital status: Married    Spouse name: N/A  . Number of children: N/A  . Years of education: N/A   Occupational History  . Not on file.   Social History Main Topics  . Smoking status: Former SResearch scientist (life sciences) . Smokeless tobacco: Never Used  . Alcohol use No  . Drug use: No  . Sexual activity: Not on file   Other Topics Concern  . Not on file   Social History Narrative  . No narrative on file    FAMILY HISTORY Family History  Problem Relation Age of Onset  . Prostate cancer Father   . Prostate cancer Brother     ALLERGIES:  is allergic to sulfa antibiotics.  MEDICATIONS:  Current Outpatient Prescriptions  Medication Sig Dispense Refill  . amLODipine (NORVASC) 10 MG tablet Take 10 mg by mouth daily.    .Marland Kitchenaspirin EC 81 MG tablet Take 81 mg by mouth daily.    .Marland Kitchenatorvastatin (LIPITOR) 10 MG tablet Take 10 mg by mouth at bedtime.     . ferrous sulfate 325 (65 FE) MG tablet Take 325 mg by mouth daily with breakfast.    . folic acid (FOLVITE) 1 MG tablet Take 1 tablet (1 mg total) by mouth daily. 30 tablet 3  . furosemide (LASIX) 20 MG tablet Take 20 mg by mouth daily.    .Marland Kitchen  glimepiride (AMARYL) 2 MG tablet Take 4 mg by mouth every evening.     . pantoprazole (PROTONIX) 40 MG tablet Take 40 mg by mouth 2 (two) times daily.    . sodium bicarbonate 650 MG tablet Take 650 mg by mouth 2 (two) times daily.    . tamsulosin (FLOMAX) 0.4 MG CAPS capsule Take 1 capsule by mouth daily.    . Vitamin D, Ergocalciferol, (DRISDOL) 50000 units CAPS capsule Take 50,000 Units by mouth every 7 (seven) days.     No current facility-administered medications for this visit.     PHYSICAL EXAMINATION:  ECOG PERFORMANCE STATUS: 1 - Symptomatic but completely ambulatory   Vitals:   08/13/17 1118  BP: (!) 167/57   Pulse: 75  Temp: (!) 97.1 F (36.2 C)    Filed Weights   08/13/17 1118  Weight: 159 lb (72.1 kg)     Physical Exam GENERAL: No distress, well nourished.  SKIN:  No rashes or significant lesions  HEAD: Normocephalic, No masses, lesions, tenderness or abnormalities  EYES: Conjunctiva are pink, non icteric ENT: External ears normal ,lips , buccal mucosa, and tongue normal and mucous membranes are moist  LYMPH: No palpable cervical and axillary lymphadenopathy  LUNGS: Clear to auscultation, no crackles or wheezes HEART: Regular rate & rhythm, + systolic murmurs, no gallops, S1 normal and S2 normal  ABDOMEN: Abdomen soft, non-tender, normal bowel sounds, I did not appreciate any  masses or organomegaly  MUSCULOSKELETAL: No CVA tenderness and no tenderness on percussion of the back or rib cage.  EXTREMITIES: No edema, no skin discoloration or tenderness NEURO: Alert & oriented, no focal motor/sensory deficits.   LABORATORY DATA: I have personally reviewed the data as listed:  Appointment on 08/13/2017  Component Date Value Ref Range Status  . WBC 08/13/2017 5.9  3.8 - 10.6 K/uL Final  . RBC 08/13/2017 2.59* 4.40 - 5.90 MIL/uL Final  . Hemoglobin 08/13/2017 7.3* 13.0 - 18.0 g/dL Final  . HCT 08/13/2017 21.7* 40.0 - 52.0 % Final  . MCV 08/13/2017 83.7  80.0 - 100.0 fL Final  . MCH 08/13/2017 28.2  26.0 - 34.0 pg Final  . MCHC 08/13/2017 33.7  32.0 - 36.0 g/dL Final  . RDW 08/13/2017 16.5* 11.5 - 14.5 % Final  . Platelets 08/13/2017 203  150 - 440 K/uL Final  . Neutrophils Relative % 08/13/2017 60  % Final  . Neutro Abs 08/13/2017 3.6  1.4 - 6.5 K/uL Final  . Lymphocytes Relative 08/13/2017 24  % Final  . Lymphs Abs 08/13/2017 1.4  1.0 - 3.6 K/uL Final  . Monocytes Relative 08/13/2017 10  % Final  . Monocytes Absolute 08/13/2017 0.6  0.2 - 1.0 K/uL Final  . Eosinophils Relative 08/13/2017 5  % Final  . Eosinophils Absolute 08/13/2017 0.3  0 - 0.7 K/uL Final  . Basophils  Relative 08/13/2017 1  % Final  . Basophils Absolute 08/13/2017 0.0  0 - 0.1 K/uL Final  . Sodium 08/13/2017 136  135 - 145 mmol/L Final  . Potassium 08/13/2017 3.8  3.5 - 5.1 mmol/L Final  . Chloride 08/13/2017 102  101 - 111 mmol/L Final  . CO2 08/13/2017 25  22 - 32 mmol/L Final  . Glucose, Bld 08/13/2017 190* 65 - 99 mg/dL Final  . BUN 08/13/2017 36* 6 - 20 mg/dL Final  . Creatinine, Ser 08/13/2017 2.51* 0.61 - 1.24 mg/dL Final  . Calcium 08/13/2017 8.7* 8.9 - 10.3 mg/dL Final  . Total Protein 08/13/2017 6.8  6.5 - 8.1 g/dL Final  . Albumin 08/13/2017 3.6  3.5 - 5.0 g/dL Final  . AST 08/13/2017 21  15 - 41 U/L Final  . ALT 08/13/2017 21  17 - 63 U/L Final  . Alkaline Phosphatase 08/13/2017 84  38 - 126 U/L Final  . Total Bilirubin 08/13/2017 0.4  0.3 - 1.2 mg/dL Final  . GFR calc non Af Amer 08/13/2017 21* >60 mL/min Final  . GFR calc Af Amer 08/13/2017 24* >60 mL/min Final   Comment: (NOTE) The eGFR has been calculated using the CKD EPI equation. This calculation has not been validated in all clinical situations. eGFR's persistently <60 mL/min signify possible Chronic Kidney Disease.   . Anion gap 08/13/2017 9  5 - 15 Final  Appointment on 08/06/2017  Component Date Value Ref Range Status  . WBC 08/06/2017 6.6  3.8 - 10.6 K/uL Final  . RBC 08/06/2017 3.13* 4.40 - 5.90 MIL/uL Final  . Hemoglobin 08/06/2017 8.6* 13.0 - 18.0 g/dL Final  . HCT 08/06/2017 25.4* 40.0 - 52.0 % Final  . MCV 08/06/2017 81.2  80.0 - 100.0 fL Final  . MCH 08/06/2017 27.4  26.0 - 34.0 pg Final  . MCHC 08/06/2017 33.8  32.0 - 36.0 g/dL Final  . RDW 08/06/2017 15.9* 11.5 - 14.5 % Final  . Platelets 08/06/2017 214  150 - 440 K/uL Final  . Neutrophils Relative % 08/06/2017 57  % Final  . Neutro Abs 08/06/2017 3.8  1.4 - 6.5 K/uL Final  . Lymphocytes Relative 08/06/2017 29  % Final  . Lymphs Abs 08/06/2017 1.9  1.0 - 3.6 K/uL Final  . Monocytes Relative 08/06/2017 9  % Final  . Monocytes Absolute  08/06/2017 0.6  0.2 - 1.0 K/uL Final  . Eosinophils Relative 08/06/2017 4  % Final  . Eosinophils Absolute 08/06/2017 0.3  0 - 0.7 K/uL Final  . Basophils Relative 08/06/2017 1  % Final  . Basophils Absolute 08/06/2017 0.0  0 - 0.1 K/uL Final  . Iron 08/06/2017 60  45 - 182 ug/dL Final  . TIBC 08/06/2017 295  250 - 450 ug/dL Final  . Saturation Ratios 08/06/2017 20  17.9 - 39.5 % Final  . UIBC 08/06/2017 235  ug/dL Final  . Ferritin 08/06/2017 374* 24 - 336 ng/mL Final  . Intrinsic Factor 08/06/2017 1.0  0.0 - 1.1 AU/mL Final   Comment: (NOTE) Performed At: [facility] [place], [place] [id number] Lindon Romp MD PQ:[id number]   . Parietal Cell Antibody-IgG 08/06/2017 4.1  0.0 - 20.0 Units Final   Comment: (NOTE)                                Negative    0.0 - 20.0                                Equivocal  20.1 - 24.9                                Positive         >24.9 Parietal Cell Antibodies are found in 90% of patients with pernicious anemia and 30% of first degree relatives with pernicious anemia. Performed At: [facility] [place], [place] [id number] Lindon Romp MD LS:[id number]   Orders  Only on 07/30/2017  Component Date Value Ref Range Status  . Fecal Occult Bld 07/24/2017 NEGATIVE  NEGATIVE Final  . Fecal Occult Bld 07/25/2017 NEGATIVE  NEGATIVE Final  . Fecal Occult Bld 07/26/2017 NEGATIVE  NEGATIVE Final  Appointment on 07/22/2017  Component Date Value Ref Range Status  . WBC 07/22/2017 6.4  3.8 - 10.6 K/uL Final  . RBC 07/22/2017 3.40* 4.40 - 5.90 MIL/uL Final  . Hemoglobin 07/22/2017 9.2* 13.0 - 18.0 g/dL Final  . HCT 07/22/2017 27.4* 40.0 - 52.0 % Final  . MCV 07/22/2017 80.4  80.0 - 100.0 fL Final  . MCH 07/22/2017 26.9  26.0 - 34.0 pg Final  . MCHC 07/22/2017 33.5  32.0 - 36.0 g/dL Final  . RDW 07/22/2017 17.7* 11.5 - 14.5 % Final  . Platelets 07/22/2017 184  150 - 440 K/uL Final  . Neutrophils  Relative % 07/22/2017 64  % Final  . Neutro Abs 07/22/2017 4.1  1.4 - 6.5 K/uL Final  . Lymphocytes Relative 07/22/2017 23  % Final  . Lymphs Abs 07/22/2017 1.5  1.0 - 3.6 K/uL Final  . Monocytes Relative 07/22/2017 9  % Final  . Monocytes Absolute 07/22/2017 0.6  0.2 - 1.0 K/uL Final  . Eosinophils Relative 07/22/2017 3  % Final  . Eosinophils Absolute 07/22/2017 0.2  0 - 0.7 K/uL Final  . Basophils Relative 07/22/2017 1  % Final  . Basophils Absolute 07/22/2017 0.0  0 - 0.1 K/uL Final  . Retic Ct Pct 07/22/2017 1.0  0.4 - 3.1 % Final  . RBC. 07/22/2017 3.54* 4.40 - 5.90 MIL/uL Final  . Retic Count, Absolute 07/22/2017 35.4  19.0 - 183.0 K/uL Final  . Folate 07/22/2017 19.0  >5.9 ng/mL Final  . Vitamin B-12 07/22/2017 162* 180 - 914 pg/mL Final   Comment: (NOTE) This assay is not validated for testing neonatal or myeloproliferative syndrome specimens for Vitamin B12 levels. Performed at [facility], [place] [address], [place], [place] [address]   . Kappa free light chain 07/22/2017 53.0* 3.3 - 19.4 mg/L Final  . Lamda free light chains 07/22/2017 42.8* 5.7 - 26.3 mg/L Final  . Kappa, lamda light chain ratio 07/22/2017 1.24  0.26 - 1.65 Final   Comment: (NOTE) Performed At: [facility] [place], [place] [id number] Lindon Romp MD HE:[id number]   . TSH 07/22/2017 11.834* 0.350 - 4.500 uIU/mL Final   Performed by a 3rd Generation assay with a functional sensitivity of <=0.01 uIU/mL.  Marland Kitchen LDH 07/22/2017 206* 98 - 192 U/L Final  . Haptoglobin 07/22/2017 259* 34 - 200 mg/dL Final   Comment: (NOTE) Performed At: [facility] [place], [place] [id number] Lindon Romp MD OE:[id number]   Orders Only on 07/22/2017  Component Date Value Ref Range Status  . Total Protein ELP 07/22/2017 6.8  6.0 - 8.5 g/dL Final  . Albumin ELP 07/22/2017 3.6  2.9 - 4.4 g/dL Final  . Alpha-1-Globulin 07/22/2017 0.3  0.0 - 0.4 g/dL Final  .  Alpha-2-Globulin 07/22/2017 0.9  0.4 - 1.0 g/dL Final  . Beta Globulin 07/22/2017 1.0  0.7 - 1.3 g/dL Final  . Gamma Globulin 07/22/2017 0.9  0.4 - 1.8 g/dL Final  . M-Spike, % 07/22/2017 Not Observed  Not Observed g/dL Final  . SPE Interp. 07/22/2017 Comment   Final   Comment: (NOTE) The SPE pattern appears essentially unremarkable. Evidence of monoclonal protein is not apparent. Performed At: [facility] [address]  [place], [place] [id number] Lindon Romp MD HU:[id number]   . Comment 07/22/2017 Comment   Final   Comment: (NOTE) Protein electrophoresis scan will follow via computer, mail, or courier delivery.   Marland Kitchen GLOBULIN, TOTAL 07/22/2017 3.2  2.2 - 3.9 g/dL Corrected  . A/G Ratio 07/22/2017 1.1  0.7 - 1.7 Corrected  . Methylmalonic Acid, Quantitative 07/22/2017 571* 0 - 378 nmol/L Final  . Disclaimer: 07/22/2017 Comment   Final   Comment: (NOTE) This test was developed and its performance characteristics determined by [facility]. It has not been cleared or approved by the Food and Drug Administration. Performed At: [facility] [place], [place] [id number] Lindon Romp MD ZG:[id number]    [facility] results.  07/08/2017 Iron 81, TIBC 298, Saturation 27%, Ferritin 57  RADIOGRAPHIC STUDIES: I have personally reviewed the radiological images as listed and agree with the findings in the report 06/10/2017 CT abdomen pelvis IMPRESSION: Wide neck small bowel containing indirect right inguinal hernia. Mild stranding of the herniated mesenteric without definite evidence of incarceration.  Atrophic appearance of the kidneys.  Multiple urinary bladder diverticulae. ASSESSMENT/PLAN  1. Anemia in stage 4 chronic kidney disease ([place])   2. Iron deficiency anemia, unspecified iron deficiency anemia type   3. CKD (chronic kidney disease) stage 4, GFR 15-29 ml/min (HCC)   4. Heart murmur   5. Anemia due to vitamin B12 deficiency, unspecified B12  deficiency type   6. Leg swelling   . # s/p 2 does of Feraheme. Iron store is fully replete.He is tested negative for occult blood in stool.  # started on Vitamin B12 IM shots weekly for total 4 doses. Tested negative for anti parietal antibody or intrinsic factor antibody. Can be started on oral B12 supplementation after he finished his IM shots.  # STAT US venous doppler to rule out DVT. If negative can start procrit. Edema likely due to CKD, or underlying heard disease.   # Anemia is not improved, and further decreased. Negative MM work up. Likely secondary to CKD. If [place] LE doppler is negative Plan to start procrit 40,000 monthly with monitoring Hb response. Discussed with patient and his son regarding side effects includes, but not limited to, risk of blood clots, high blood pressure, GI side effects, etc.  # His systolic murmur may reflecting aortic stenosis, which may contribute to destruction of his RBCs. On folic acid supplementation.  Orders Placed This Encounter  Procedures  . US Venous Img Lower Bilateral    Standing Status:   Future    Standing Expiration Date:   10/13/2018    Order Specific Question:   Reason for Exam (SYMPTOM  OR DIAGNOSIS REQUIRED)    Answer:   bilateral LE swelling    Order Specific Question:   Preferred imaging location?    Answer:   [facility]  . CBC with Differential/Platelet    Standing Status:   Future    Number of Occurrences:   1    Standing Expiration Date:   08/13/2018  . Comprehensive metabolic panel    Standing Status:   Future    Number of Occurrences:   1    Standing Expiration Date:   08/13/2018  . Iron and TIBC    Standing Status:   Future    Number of Occurrences:   1    Standing Expiration Date:   08/13/2018  . Ferritin    Standing Status:   Future    Number of Occurrences:   1  Standing Expiration Date:   08/13/2018  . CBC with Differential/Platelet    Standing Status:   Standing    Number of Occurrences:   20    Standing  Expiration Date:   08/13/2018   Trend CBC in 2 weeks.  CBC in 4 weeks,and follow up with me with possible procrit injection.   All questions were answered. The patient knows to call the clinic with any problems, questions or concerns. Earlie Server, MD  08/13/2017 11:47 AM

## 2017-08-15 ENCOUNTER — Inpatient Hospital Stay: Payer: Medicare HMO

## 2017-08-15 VITALS — BP 156/57 | HR 68 | Temp 97.7°F | Resp 18

## 2017-08-15 DIAGNOSIS — D509 Iron deficiency anemia, unspecified: Secondary | ICD-10-CM

## 2017-08-15 DIAGNOSIS — I129 Hypertensive chronic kidney disease with stage 1 through stage 4 chronic kidney disease, or unspecified chronic kidney disease: Secondary | ICD-10-CM | POA: Diagnosis not present

## 2017-08-15 DIAGNOSIS — Z79899 Other long term (current) drug therapy: Secondary | ICD-10-CM | POA: Diagnosis not present

## 2017-08-15 DIAGNOSIS — R531 Weakness: Secondary | ICD-10-CM | POA: Diagnosis not present

## 2017-08-15 DIAGNOSIS — D631 Anemia in chronic kidney disease: Secondary | ICD-10-CM | POA: Diagnosis not present

## 2017-08-15 DIAGNOSIS — E538 Deficiency of other specified B group vitamins: Secondary | ICD-10-CM | POA: Diagnosis not present

## 2017-08-15 DIAGNOSIS — M7989 Other specified soft tissue disorders: Secondary | ICD-10-CM | POA: Diagnosis not present

## 2017-08-15 DIAGNOSIS — N184 Chronic kidney disease, stage 4 (severe): Secondary | ICD-10-CM | POA: Diagnosis not present

## 2017-08-15 DIAGNOSIS — R5383 Other fatigue: Secondary | ICD-10-CM | POA: Diagnosis not present

## 2017-08-15 MED ORDER — EPOETIN ALFA 40000 UNIT/ML IJ SOLN
40000.0000 [IU] | Freq: Once | INTRAMUSCULAR | Status: AC
  Administered 2017-08-15: 40000 [IU] via SUBCUTANEOUS
  Filled 2017-08-15: qty 1

## 2017-08-19 ENCOUNTER — Inpatient Hospital Stay: Payer: Medicare HMO

## 2017-08-20 ENCOUNTER — Inpatient Hospital Stay: Payer: Medicare HMO

## 2017-08-21 ENCOUNTER — Ambulatory Visit: Payer: Medicare HMO

## 2017-08-26 ENCOUNTER — Inpatient Hospital Stay: Payer: Medicare HMO

## 2017-08-26 ENCOUNTER — Ambulatory Visit: Payer: Medicare HMO

## 2017-08-26 ENCOUNTER — Other Ambulatory Visit: Payer: Medicare HMO

## 2017-08-26 DIAGNOSIS — M7989 Other specified soft tissue disorders: Secondary | ICD-10-CM | POA: Diagnosis not present

## 2017-08-26 DIAGNOSIS — N184 Chronic kidney disease, stage 4 (severe): Secondary | ICD-10-CM | POA: Diagnosis not present

## 2017-08-26 DIAGNOSIS — D631 Anemia in chronic kidney disease: Secondary | ICD-10-CM

## 2017-08-26 DIAGNOSIS — I129 Hypertensive chronic kidney disease with stage 1 through stage 4 chronic kidney disease, or unspecified chronic kidney disease: Secondary | ICD-10-CM | POA: Diagnosis not present

## 2017-08-26 DIAGNOSIS — R531 Weakness: Secondary | ICD-10-CM | POA: Diagnosis not present

## 2017-08-26 DIAGNOSIS — R5383 Other fatigue: Secondary | ICD-10-CM | POA: Diagnosis not present

## 2017-08-26 DIAGNOSIS — D509 Iron deficiency anemia, unspecified: Secondary | ICD-10-CM | POA: Diagnosis not present

## 2017-08-26 DIAGNOSIS — E538 Deficiency of other specified B group vitamins: Secondary | ICD-10-CM | POA: Diagnosis not present

## 2017-08-26 DIAGNOSIS — Z79899 Other long term (current) drug therapy: Secondary | ICD-10-CM | POA: Diagnosis not present

## 2017-08-26 DIAGNOSIS — D519 Vitamin B12 deficiency anemia, unspecified: Secondary | ICD-10-CM

## 2017-08-26 LAB — CBC WITH DIFFERENTIAL/PLATELET
BASOS ABS: 0 10*3/uL (ref 0–0.1)
BASOS PCT: 1 %
Eosinophils Absolute: 0.3 10*3/uL (ref 0–0.7)
Eosinophils Relative: 6 %
HEMATOCRIT: 30.4 % — AB (ref 40.0–52.0)
Hemoglobin: 9.9 g/dL — ABNORMAL LOW (ref 13.0–18.0)
LYMPHS PCT: 29 %
Lymphs Abs: 1.7 10*3/uL (ref 1.0–3.6)
MCH: 28.1 pg (ref 26.0–34.0)
MCHC: 32.7 g/dL (ref 32.0–36.0)
MCV: 85.9 fL (ref 80.0–100.0)
MONO ABS: 0.5 10*3/uL (ref 0.2–1.0)
Monocytes Relative: 10 %
NEUTROS ABS: 3.2 10*3/uL (ref 1.4–6.5)
NEUTROS PCT: 54 %
Platelets: 178 10*3/uL (ref 150–440)
RBC: 3.54 MIL/uL — AB (ref 4.40–5.90)
RDW: 16.6 % — ABNORMAL HIGH (ref 11.5–14.5)
WBC: 5.7 10*3/uL (ref 3.8–10.6)

## 2017-08-26 MED ORDER — CYANOCOBALAMIN 1000 MCG/ML IJ SOLN
1000.0000 ug | Freq: Once | INTRAMUSCULAR | Status: AC
  Administered 2017-08-26: 1000 ug via INTRAMUSCULAR
  Filled 2017-08-26: qty 1

## 2017-08-28 ENCOUNTER — Ambulatory Visit: Payer: Medicare HMO

## 2017-08-28 ENCOUNTER — Other Ambulatory Visit: Payer: Medicare HMO

## 2017-09-12 ENCOUNTER — Inpatient Hospital Stay (HOSPITAL_BASED_OUTPATIENT_CLINIC_OR_DEPARTMENT_OTHER): Payer: Medicare HMO | Admitting: Oncology

## 2017-09-12 ENCOUNTER — Encounter: Payer: Self-pay | Admitting: Oncology

## 2017-09-12 ENCOUNTER — Inpatient Hospital Stay: Payer: Medicare HMO

## 2017-09-12 ENCOUNTER — Inpatient Hospital Stay: Payer: Medicare HMO | Attending: Oncology

## 2017-09-12 VITALS — BP 161/63 | HR 71 | Temp 97.1°F | Wt 150.2 lb

## 2017-09-12 DIAGNOSIS — Z7982 Long term (current) use of aspirin: Secondary | ICD-10-CM

## 2017-09-12 DIAGNOSIS — Z87442 Personal history of urinary calculi: Secondary | ICD-10-CM | POA: Insufficient documentation

## 2017-09-12 DIAGNOSIS — E785 Hyperlipidemia, unspecified: Secondary | ICD-10-CM | POA: Insufficient documentation

## 2017-09-12 DIAGNOSIS — E119 Type 2 diabetes mellitus without complications: Secondary | ICD-10-CM | POA: Insufficient documentation

## 2017-09-12 DIAGNOSIS — I129 Hypertensive chronic kidney disease with stage 1 through stage 4 chronic kidney disease, or unspecified chronic kidney disease: Secondary | ICD-10-CM | POA: Diagnosis not present

## 2017-09-12 DIAGNOSIS — Z7984 Long term (current) use of oral hypoglycemic drugs: Secondary | ICD-10-CM

## 2017-09-12 DIAGNOSIS — R531 Weakness: Secondary | ICD-10-CM

## 2017-09-12 DIAGNOSIS — R5383 Other fatigue: Secondary | ICD-10-CM | POA: Insufficient documentation

## 2017-09-12 DIAGNOSIS — D631 Anemia in chronic kidney disease: Secondary | ICD-10-CM

## 2017-09-12 DIAGNOSIS — Z79899 Other long term (current) drug therapy: Secondary | ICD-10-CM

## 2017-09-12 DIAGNOSIS — N184 Chronic kidney disease, stage 4 (severe): Secondary | ICD-10-CM

## 2017-09-12 DIAGNOSIS — Z87891 Personal history of nicotine dependence: Secondary | ICD-10-CM | POA: Diagnosis not present

## 2017-09-12 DIAGNOSIS — D509 Iron deficiency anemia, unspecified: Secondary | ICD-10-CM

## 2017-09-12 DIAGNOSIS — K4090 Unilateral inguinal hernia, without obstruction or gangrene, not specified as recurrent: Secondary | ICD-10-CM

## 2017-09-12 DIAGNOSIS — Z8042 Family history of malignant neoplasm of prostate: Secondary | ICD-10-CM

## 2017-09-12 DIAGNOSIS — I2510 Atherosclerotic heart disease of native coronary artery without angina pectoris: Secondary | ICD-10-CM | POA: Insufficient documentation

## 2017-09-12 DIAGNOSIS — D519 Vitamin B12 deficiency anemia, unspecified: Secondary | ICD-10-CM | POA: Diagnosis not present

## 2017-09-12 DIAGNOSIS — I10 Essential (primary) hypertension: Secondary | ICD-10-CM

## 2017-09-12 DIAGNOSIS — K219 Gastro-esophageal reflux disease without esophagitis: Secondary | ICD-10-CM

## 2017-09-12 LAB — CBC WITH DIFFERENTIAL/PLATELET
BASOS ABS: 0 10*3/uL (ref 0–0.1)
Basophils Relative: 1 %
EOS ABS: 0.3 10*3/uL (ref 0–0.7)
EOS PCT: 5 %
HCT: 32 % — ABNORMAL LOW (ref 40.0–52.0)
HEMOGLOBIN: 10.5 g/dL — AB (ref 13.0–18.0)
LYMPHS PCT: 30 %
Lymphs Abs: 1.7 10*3/uL (ref 1.0–3.6)
MCH: 28.3 pg (ref 26.0–34.0)
MCHC: 33 g/dL (ref 32.0–36.0)
MCV: 85.9 fL (ref 80.0–100.0)
Monocytes Absolute: 0.5 10*3/uL (ref 0.2–1.0)
Monocytes Relative: 9 %
NEUTROS PCT: 55 %
Neutro Abs: 3.3 10*3/uL (ref 1.4–6.5)
PLATELETS: 178 10*3/uL (ref 150–440)
RBC: 3.73 MIL/uL — AB (ref 4.40–5.90)
RDW: 14.6 % — ABNORMAL HIGH (ref 11.5–14.5)
WBC: 5.9 10*3/uL (ref 3.8–10.6)

## 2017-09-12 MED ORDER — CYANOCOBALAMIN 500 MCG PO TABS: 1000.0000 ug | ORAL_TABLET | Freq: Every day | ORAL | 3 refills | Status: DC

## 2017-09-12 MED ORDER — EPOETIN ALFA 20000 UNIT/ML IJ SOLN
20000.0000 [IU] | Freq: Once | INTRAMUSCULAR | Status: AC
  Administered 2017-09-12: 20000 [IU] via SUBCUTANEOUS
  Filled 2017-09-12: qty 1

## 2017-09-12 NOTE — Progress Notes (Signed)
Patient here today for follow up.  Patient states no new concerns today  

## 2017-09-12 NOTE — Progress Notes (Signed)
[facility] Follow Up visit  Patient Care Team: [name], MD as PCP - General (Internal Medicine)  REASON FOR VISIT Follow up for treatment of anemia   HISTORY OF PRESENTING ILLNESS: Jay Mathis 83 y.o. male with PMH listed as below who presents for follow up of his treatment of anemia.  Pertinent hematology history includes: Patient has recently established care with nephrologist Dr.Kulluru for CKD and was referred to [place] for anemia work up and treatment. Patient at that time reports feeling fatigue, weakness. Noted black stool. He has inguinal hernia for which he has seen surgery and was not recommended for intervention.  He was found to be functional iron deficiency and he has received 2 doses of Feraheme, tolerated well and four weekly dose of 1082mcg B12 shot.  Procrit was started on 08/15/2017, he received 40,000 units.  Today He feels his fatigue has significantly improved and his son noticed that lower extremity swelling has improved a lot too. Patient's energy levels is good that he mowed lawn last week.  Denies calf tenderness. Denies chest pain, SOB, abdominal pain, weight loss. Denies any bleeding events. Denies blood in the stool or change of bowel habit.     Review of Systems  Constitutional: Positive for fatigue.  HENT:  Negative.   Eyes: Negative.   Respiratory: Negative.   Cardiovascular: Negative.   Gastrointestinal: Negative.   Endocrine: Negative.   Genitourinary: Negative.    Musculoskeletal: Negative.   Skin: Negative.   Hematological: Negative.   Psychiatric/Behavioral: Negative.     MEDICAL HISTORY: Past Medical History:  Diagnosis Date  . CAD (coronary artery disease)   . CKD (chronic kidney disease) stage 4, GFR 15-29 ml/min (HCC)   . Diabetes mellitus without complication ([place])   . GERD (gastroesophageal reflux disease)   . Hyperlipidemia   . Hypertension   . Iron deficiency anemia 07/30/2017  . Nephrolithiasis      SURGICAL HISTORY: Past Surgical History:  Procedure Laterality Date  . APPENDECTOMY     60 plus years ago  . CORONARY ANGIOPLASTY WITH STENT PLACEMENT    . CORONARY ARTERY BYPASS GRAFT      SOCIAL HISTORY: Social History   Social History  . Marital status: Married    Spouse name: N/A  . Number of children: N/A  . Years of education: N/A   Occupational History  . Not on file.   Social History Main Topics  . Smoking status: Former Research scientist (life sciences)  . Smokeless tobacco: Never Used  . Alcohol use No  . Drug use: No  . Sexual activity: Not on file   Other Topics Concern  . Not on file   Social History Narrative  . No narrative on file    FAMILY HISTORY Family History  Problem Relation Age of Onset  . Prostate cancer Father   . Prostate cancer Brother     ALLERGIES:  is allergic to sulfa antibiotics.  MEDICATIONS:  Current Outpatient Prescriptions  Medication Sig Dispense Refill  . amLODipine (NORVASC) 10 MG tablet Take 10 mg by mouth daily.    Marland Kitchen aspirin EC 81 MG tablet Take 81 mg by mouth daily.    Marland Kitchen atorvastatin (LIPITOR) 10 MG tablet Take 10 mg by mouth at bedtime.     . ferrous sulfate 325 (65 FE) MG tablet Take 325 mg by mouth daily with breakfast.    . folic acid (FOLVITE) 1 MG tablet Take 1 tablet (1 mg total) by mouth daily. 30 tablet  3  . furosemide (LASIX) 20 MG tablet Take 20 mg by mouth daily.    Marland Kitchen glimepiride (AMARYL) 2 MG tablet Take 4 mg by mouth every evening.     . pantoprazole (PROTONIX) 40 MG tablet Take 40 mg by mouth 2 (two) times daily.    . sodium bicarbonate 650 MG tablet Take 650 mg by mouth 2 (two) times daily.    . tamsulosin (FLOMAX) 0.4 MG CAPS capsule Take 1 capsule by mouth daily.    . Vitamin D, Ergocalciferol, (DRISDOL) 50000 units CAPS capsule Take 50,000 Units by mouth every 7 (seven) days.     No current facility-administered medications for this visit.     PHYSICAL EXAMINATION:  ECOG PERFORMANCE STATUS: 1 - Symptomatic but  completely ambulatory   Vitals:   09/12/17 1340 09/12/17 1409  BP: (!) 174/67 (!) 161/63  Pulse: 85 71  Temp: (!) 97.1 F (36.2 C)     Filed Weights   09/12/17 1340  Weight: 150 lb 3 oz (68.1 kg)     Physical Exam GENERAL: No distress, well nourished.  SKIN:  No rashes or significant lesions  HEAD: Normocephalic, No masses, lesions, tenderness or abnormalities  EYES: Conjunctiva are pink, non icteric ENT: External ears normal ,lips , buccal mucosa, and tongue normal and mucous membranes are moist  LYMPH: No palpable cervical and axillary lymphadenopathy  LUNGS: Clear to auscultation, no crackles or wheezes HEART: Regular rate & rhythm, + systolic murmurs, no gallops, S1 normal and S2 normal  ABDOMEN: Abdomen soft, non-tender, normal bowel sounds, I did not appreciate any  masses or organomegaly  MUSCULOSKELETAL: No CVA tenderness and no tenderness on percussion of the back or rib cage.  EXTREMITIES: No edema, no skin discoloration or tenderness NEURO: Alert & oriented, no focal motor/sensory deficits.   LABORATORY DATA: I have personally reviewed the data as listed:  Appointment on 08/26/2017  Component Date Value Ref Range Status  . WBC 08/26/2017 5.7  3.8 - 10.6 K/uL Final  . RBC 08/26/2017 3.54* 4.40 - 5.90 MIL/uL Final  . Hemoglobin 08/26/2017 9.9* 13.0 - 18.0 g/dL Final  . HCT 08/26/2017 30.4* 40.0 - 52.0 % Final  . MCV 08/26/2017 85.9  80.0 - 100.0 fL Final  . MCH 08/26/2017 28.1  26.0 - 34.0 pg Final  . MCHC 08/26/2017 32.7  32.0 - 36.0 g/dL Final  . RDW 08/26/2017 16.6* 11.5 - 14.5 % Final  . Platelets 08/26/2017 178  150 - 440 K/uL Final  . Neutrophils Relative % 08/26/2017 54  % Final  . Neutro Abs 08/26/2017 3.2  1.4 - 6.5 K/uL Final  . Lymphocytes Relative 08/26/2017 29  % Final  . Lymphs Abs 08/26/2017 1.7  1.0 - 3.6 K/uL Final  . Monocytes Relative 08/26/2017 10  % Final  . Monocytes Absolute 08/26/2017 0.5  0.2 - 1.0 K/uL Final  . Eosinophils  Relative 08/26/2017 6  % Final  . Eosinophils Absolute 08/26/2017 0.3  0 - 0.7 K/uL Final  . Basophils Relative 08/26/2017 1  % Final  . Basophils Absolute 08/26/2017 0.0  0 - 0.1 K/uL Final   [facility] results.  07/08/2017 Iron 81, TIBC 298, Saturation 27%, Ferritin 57 Iron/TIBC/Ferritin/ %Sat    Component Value Date/Time   IRON 52 08/13/2017 1049   TIBC 259 08/13/2017 1049   FERRITIN 492 (H) 08/13/2017 1049   IRONPCTSAT 20 08/13/2017 1049   Tested negative for anti parietal antibody or intrinsic factor antibody. He is tested negative for occult blood  in stool.   RADIOGRAPHIC STUDIES: I have personally reviewed the radiological images as listed and agree with the findings in the report 06/10/2017 CT abdomen pelvis IMPRESSION: Wide neck small bowel containing indirect right inguinal hernia. Mild stranding of the herniated mesenteric without definite evidence of incarceration. Atrophic appearance of the kidneys. Multiple urinary bladder diverticulae.  ASSESSMENT/PLAN  1. CKD (chronic kidney disease) stage 4, GFR 15-29 ml/min (HCC)   2. Anemia in stage 4 chronic kidney disease ([place])   3. Iron deficiency anemia, unspecified iron deficiency anemia type   4. Anemia due to vitamin B12 deficiency, unspecified B12 deficiency type   5. Essential hypertension   . # s/p 2 does of Feraheme and 4 weekly dose of Vitamin B12 shots. . Iron store is fully replete.   Started on oral B12 supplementation, Rx sent.. Check B12 at next visit.    # Hb improved well at 10.5. Will reduce procrit to 20,000 unit today and maintain his Hb between 10-11.  # HTN: patient postponed his appointment with PCP. Advise him to make appointment to see primary physician for tighter control of blood pressure. Continue Norvasc 10mg  daily.   Orders Placed This Encounter  Procedures  . Vitamin B12    Standing Status:   Future    Standing Expiration Date:   12/13/2017   Follow up visit in 4 weeks with labs and  procrit.  All questions were answered. The patient knows to call the clinic with any problems, questions or concerns.  Earlie Server, MD  09/12/2017 1:29 PM

## 2017-09-12 NOTE — Progress Notes (Signed)
Dr. Tasia Catchings approves patient receiving Procrit with elevated BP.

## 2017-09-18 ENCOUNTER — Telehealth: Payer: Self-pay | Admitting: Oncology

## 2017-09-18 NOTE — Telephone Encounter (Signed)
10/14/17 appt rschd and conf with patient to earlier time.  Updated appointment also mailed.

## 2017-10-07 DIAGNOSIS — E872 Acidosis: Secondary | ICD-10-CM | POA: Diagnosis not present

## 2017-10-07 DIAGNOSIS — D631 Anemia in chronic kidney disease: Secondary | ICD-10-CM | POA: Diagnosis not present

## 2017-10-07 DIAGNOSIS — N2581 Secondary hyperparathyroidism of renal origin: Secondary | ICD-10-CM | POA: Diagnosis not present

## 2017-10-07 DIAGNOSIS — N184 Chronic kidney disease, stage 4 (severe): Secondary | ICD-10-CM | POA: Diagnosis not present

## 2017-10-07 DIAGNOSIS — I129 Hypertensive chronic kidney disease with stage 1 through stage 4 chronic kidney disease, or unspecified chronic kidney disease: Secondary | ICD-10-CM | POA: Diagnosis not present

## 2017-10-10 ENCOUNTER — Ambulatory Visit: Payer: Medicare HMO | Admitting: Oncology

## 2017-10-10 ENCOUNTER — Ambulatory Visit: Payer: Medicare HMO

## 2017-10-10 ENCOUNTER — Other Ambulatory Visit: Payer: Medicare HMO

## 2017-10-13 NOTE — Progress Notes (Signed)
[facility] Follow Up visit  Patient Care Team: [name], MD as PCP - General (Internal Medicine)  REASON FOR VISIT Follow up for treatment of anemia   HISTORY OF PRESENTING ILLNESS: Jay Mathis 83 y.o. male with PMH listed as below who presents for follow up of his treatment of anemia.  Pertinent hematology history includes: Patient has recently established care with nephrologist Dr.Kulluru for CKD and was referred to [place] for anemia work up and treatment. Patient at that time reports feeling fatigue, weakness. Noted black stool. He has inguinal hernia for which he has seen surgery and was not recommended for intervention.  He was found to be functional iron deficiency and he has received 2 doses of Feraheme, tolerated well and four weekly dose of 1068mcg B12 shot.  Procrit was started on 08/15/2017, he received 40,000 units.   Today patient feels that his energy level has significantly improved. He Obtained a small laceration of her right hand and also bruising while he was trying to cut a tree branch. He has more exercise induration.  Denies chest pain, SOB, abdominal pain, weight loss. Denies any bleeding events. Denies blood in the stool or change of bowel habit.     Review of Systems  Constitutional: Negative for fatigue.  HENT:  Negative.  Negative for nosebleeds and sore throat.   Eyes: Negative.  Negative for eye problems.  Respiratory: Negative.  Negative for cough.   Cardiovascular: Negative.  Negative for chest pain and leg swelling.  Gastrointestinal: Negative.  Negative for abdominal pain.  Endocrine: Negative.   Genitourinary: Negative.  Negative for dysuria, frequency and hematuria.   Musculoskeletal: Negative.  Negative for back pain and myalgias.  Skin: Negative.  Negative for itching and rash.  Neurological: Negative for headaches.  Hematological: Negative.  Negative for adenopathy.  Psychiatric/Behavioral: Negative.  The patient is not  nervous/anxious.     MEDICAL HISTORY: Past Medical History:  Diagnosis Date  . CAD (coronary artery disease)   . CKD (chronic kidney disease) stage 4, GFR 15-29 ml/min (HCC)   . Diabetes mellitus without complication ([place])   . GERD (gastroesophageal reflux disease)   . Hyperlipidemia   . Hypertension   . Iron deficiency anemia 07/30/2017  . Nephrolithiasis     SURGICAL HISTORY: Past Surgical History:  Procedure Laterality Date  . APPENDECTOMY     60 plus years ago  . CORONARY ANGIOPLASTY WITH STENT PLACEMENT    . CORONARY ARTERY BYPASS GRAFT      SOCIAL HISTORY: Social History   Socioeconomic History  . Marital status: Married    Spouse name: Not on file  . Number of children: Not on file  . Years of education: Not on file  . Highest education level: Not on file  Social Needs  . Financial resource strain: Not on file  . Food insecurity - worry: Not on file  . Food insecurity - inability: Not on file  . Transportation needs - medical: Not on file  . Transportation needs - non-medical: Not on file  Occupational History  . Not on file  Tobacco Use  . Smoking status: Former Research scientist (life sciences)  . Smokeless tobacco: Never Used  Substance and Sexual Activity  . Alcohol use: No  . Drug use: No  . Sexual activity: Not on file  Other Topics Concern  . Not on file  Social History Narrative  . Not on file    FAMILY HISTORY Family History  Problem Relation Age of Onset  .  Prostate cancer Father   . Prostate cancer Brother     ALLERGIES:  is allergic to sulfa antibiotics.  MEDICATIONS:  Current Outpatient Medications  Medication Sig Dispense Refill  . amLODipine (NORVASC) 10 MG tablet Take 10 mg by mouth daily.    Marland Kitchen aspirin EC 81 MG tablet Take 81 mg by mouth daily.    Marland Kitchen atorvastatin (LIPITOR) 10 MG tablet Take 10 mg by mouth at bedtime.     . cyanocobalamin 500 MCG tablet Take 2 tablets (1,000 mcg total) by mouth daily. 60 tablet 3  . ferrous sulfate 325 (65 FE) MG tablet  Take 325 mg by mouth daily with breakfast.    . folic acid (FOLVITE) 1 MG tablet Take 1 tablet (1 mg total) by mouth daily. 30 tablet 3  . furosemide (LASIX) 20 MG tablet Take 20 mg by mouth daily.    Marland Kitchen glimepiride (AMARYL) 2 MG tablet Take 4 mg by mouth every evening.     . pantoprazole (PROTONIX) 40 MG tablet Take 40 mg by mouth 2 (two) times daily.    . sodium bicarbonate 650 MG tablet Take 650 mg by mouth 2 (two) times daily.    . tamsulosin (FLOMAX) 0.4 MG CAPS capsule Take 1 capsule by mouth daily.    . Vitamin D, Ergocalciferol, (DRISDOL) 50000 units CAPS capsule Take 50,000 Units by mouth every 7 (seven) days.     No current facility-administered medications for this visit.     PHYSICAL EXAMINATION:  ECOG PERFORMANCE STATUS: 0 - Asymptomatic   Vitals:   10/14/17 1109  BP: (!) 165/70  Pulse: 62  Resp: 16  Temp: 98 F (36.7 C)    There were no vitals filed for this visit.   Physical Exam GENERAL: No distress, well nourished.  SKIN:  No rashes or significant lesions  HEAD: Normocephalic, No masses, lesions, tenderness or abnormalities  EYES: Conjunctiva are pink, non icteric ENT: External ears normal ,lips , buccal mucosa, and tongue normal and mucous membranes are moist  LYMPH: No palpable cervical and axillary lymphadenopathy  LUNGS: Clear to auscultation, no crackles or wheezes HEART: Regular rate & rhythm, + systolic murmurs, no gallops, S1 normal and S2 normal  ABDOMEN: Abdomen soft, non-tender, normal bowel sounds, I did not appreciate any  masses or organomegaly  MUSCULOSKELETAL: No CVA tenderness and no tenderness on percussion of the back or rib cage.  EXTREMITIES: No edema, no skin discoloration or tenderness NEURO: Alert & oriented, no focal motor/sensory deficits.   LABORATORY DATA: I have personally reviewed the data as listed:  No visits with results within 1 Month(s) from this visit.  Latest known visit with results is:  Appointment on 09/12/2017   Component Date Value Ref Range Status  . WBC 09/12/2017 5.9  3.8 - 10.6 K/uL Final  . RBC 09/12/2017 3.73* 4.40 - 5.90 MIL/uL Final  . Hemoglobin 09/12/2017 10.5* 13.0 - 18.0 g/dL Final  . HCT 09/12/2017 32.0* 40.0 - 52.0 % Final  . MCV 09/12/2017 85.9  80.0 - 100.0 fL Final  . MCH 09/12/2017 28.3  26.0 - 34.0 pg Final  . MCHC 09/12/2017 33.0  32.0 - 36.0 g/dL Final  . RDW 09/12/2017 14.6* 11.5 - 14.5 % Final  . Platelets 09/12/2017 178  150 - 440 K/uL Final  . Neutrophils Relative % 09/12/2017 55  % Final  . Neutro Abs 09/12/2017 3.3  1.4 - 6.5 K/uL Final  . Lymphocytes Relative 09/12/2017 30  % Final  . Lymphs Abs  09/12/2017 1.7  1.0 - 3.6 K/uL Final  . Monocytes Relative 09/12/2017 9  % Final  . Monocytes Absolute 09/12/2017 0.5  0.2 - 1.0 K/uL Final  . Eosinophils Relative 09/12/2017 5  % Final  . Eosinophils Absolute 09/12/2017 0.3  0 - 0.7 K/uL Final  . Basophils Relative 09/12/2017 1  % Final  . Basophils Absolute 09/12/2017 0.0  0 - 0.1 K/uL Final   [facility] results.  07/08/2017 Iron 81, TIBC 298, Saturation 27%, Ferritin 57 Iron/TIBC/Ferritin/ %Sat    Component Value Date/Time   IRON 52 08/13/2017 1049   TIBC 259 08/13/2017 1049   FERRITIN 492 (H) 08/13/2017 1049   IRONPCTSAT 20 08/13/2017 1049   Tested negative for anti parietal antibody or intrinsic factor antibody. He is tested negative for occult blood in stool.   RADIOGRAPHIC STUDIES: I have personally reviewed the radiological images as listed and agree with the findings in the report 06/10/2017 CT abdomen pelvis IMPRESSION: Wide neck small bowel containing indirect right inguinal hernia. Mild stranding of the herniated mesenteric without definite evidence of incarceration. Atrophic appearance of the kidneys. Multiple urinary bladder diverticulae.  ASSESSMENT/PLAN  1. Anemia in stage 4 chronic kidney disease ([place])   2. Iron deficiency anemia, unspecified iron deficiency anemia type   3. CKD (chronic  kidney disease) stage 4, GFR 15-29 ml/min (HCC)   4. Heart murmur   5. Anemia due to vitamin B12 deficiency, unspecified B12 deficiency type    # Hb improved well at 11. Hold today's procrit.  # s/p 2 does of Feraheme and 4 weekly dose of Vitamin B12 shots. . Iron store is fully replete.   Started on oral B12 supplementation, Rx sent.. # B12 is checked today and the result is pending. He will receive 1 dose of vitamin B12 1000 g intramuscular today. If the level of B12 normalized, he can be continued on oral vitamin B12 supplementation. Otherwise he can be remained on B12 shots monthly.    # Systolic Heart murmur: likely aortic stenosis. Continue take Folate supplementation.  # HTN:And advised patient to continue follow-up with primary care physician to  titrate blood pressure control.  Follow up in 1 month with repeat labs/possible procrit/possible B12 shots.   Earlie Server, MD  10/13/2017 9:53 PM

## 2017-10-14 ENCOUNTER — Encounter: Payer: Self-pay | Admitting: Oncology

## 2017-10-14 ENCOUNTER — Inpatient Hospital Stay: Payer: Medicare HMO

## 2017-10-14 ENCOUNTER — Ambulatory Visit: Payer: Medicare HMO

## 2017-10-14 ENCOUNTER — Inpatient Hospital Stay: Payer: Medicare HMO | Admitting: Oncology

## 2017-10-14 ENCOUNTER — Other Ambulatory Visit: Payer: Medicare HMO

## 2017-10-14 ENCOUNTER — Inpatient Hospital Stay: Payer: Medicare HMO | Attending: Oncology

## 2017-10-14 ENCOUNTER — Ambulatory Visit: Payer: Medicare HMO | Admitting: Oncology

## 2017-10-14 VITALS — BP 165/70 | HR 62 | Temp 98.0°F | Resp 16

## 2017-10-14 DIAGNOSIS — N184 Chronic kidney disease, stage 4 (severe): Secondary | ICD-10-CM

## 2017-10-14 DIAGNOSIS — I2510 Atherosclerotic heart disease of native coronary artery without angina pectoris: Secondary | ICD-10-CM | POA: Insufficient documentation

## 2017-10-14 DIAGNOSIS — R531 Weakness: Secondary | ICD-10-CM | POA: Diagnosis not present

## 2017-10-14 DIAGNOSIS — D509 Iron deficiency anemia, unspecified: Secondary | ICD-10-CM | POA: Insufficient documentation

## 2017-10-14 DIAGNOSIS — I129 Hypertensive chronic kidney disease with stage 1 through stage 4 chronic kidney disease, or unspecified chronic kidney disease: Secondary | ICD-10-CM | POA: Insufficient documentation

## 2017-10-14 DIAGNOSIS — D631 Anemia in chronic kidney disease: Secondary | ICD-10-CM

## 2017-10-14 DIAGNOSIS — R5383 Other fatigue: Secondary | ICD-10-CM | POA: Insufficient documentation

## 2017-10-14 DIAGNOSIS — E785 Hyperlipidemia, unspecified: Secondary | ICD-10-CM | POA: Insufficient documentation

## 2017-10-14 DIAGNOSIS — Z79899 Other long term (current) drug therapy: Secondary | ICD-10-CM | POA: Diagnosis not present

## 2017-10-14 DIAGNOSIS — K219 Gastro-esophageal reflux disease without esophagitis: Secondary | ICD-10-CM | POA: Diagnosis not present

## 2017-10-14 DIAGNOSIS — E538 Deficiency of other specified B group vitamins: Secondary | ICD-10-CM | POA: Diagnosis not present

## 2017-10-14 DIAGNOSIS — Z87442 Personal history of urinary calculi: Secondary | ICD-10-CM | POA: Insufficient documentation

## 2017-10-14 DIAGNOSIS — D519 Vitamin B12 deficiency anemia, unspecified: Secondary | ICD-10-CM

## 2017-10-14 DIAGNOSIS — K449 Diaphragmatic hernia without obstruction or gangrene: Secondary | ICD-10-CM

## 2017-10-14 DIAGNOSIS — Z7984 Long term (current) use of oral hypoglycemic drugs: Secondary | ICD-10-CM | POA: Insufficient documentation

## 2017-10-14 DIAGNOSIS — Z7982 Long term (current) use of aspirin: Secondary | ICD-10-CM

## 2017-10-14 DIAGNOSIS — R011 Cardiac murmur, unspecified: Secondary | ICD-10-CM

## 2017-10-14 DIAGNOSIS — E119 Type 2 diabetes mellitus without complications: Secondary | ICD-10-CM

## 2017-10-14 LAB — CBC WITH DIFFERENTIAL/PLATELET
BASOS ABS: 0 10*3/uL (ref 0–0.1)
Basophils Relative: 1 %
EOS PCT: 7 %
Eosinophils Absolute: 0.4 10*3/uL (ref 0–0.7)
HCT: 33.8 % — ABNORMAL LOW (ref 40.0–52.0)
Hemoglobin: 11 g/dL — ABNORMAL LOW (ref 13.0–18.0)
LYMPHS PCT: 26 %
Lymphs Abs: 1.4 10*3/uL (ref 1.0–3.6)
MCH: 27.7 pg (ref 26.0–34.0)
MCHC: 32.5 g/dL (ref 32.0–36.0)
MCV: 85.1 fL (ref 80.0–100.0)
MONO ABS: 0.6 10*3/uL (ref 0.2–1.0)
Monocytes Relative: 11 %
Neutro Abs: 3 10*3/uL (ref 1.4–6.5)
Neutrophils Relative %: 55 %
PLATELETS: 173 10*3/uL (ref 150–440)
RBC: 3.97 MIL/uL — ABNORMAL LOW (ref 4.40–5.90)
RDW: 14.1 % (ref 11.5–14.5)
WBC: 5.3 10*3/uL (ref 3.8–10.6)

## 2017-10-14 LAB — VITAMIN B12: VITAMIN B 12: 940 pg/mL — AB (ref 180–914)

## 2017-10-14 MED ORDER — FOLIC ACID 1 MG PO TABS: 1.0000 mg | ORAL_TABLET | Freq: Every day | ORAL | 3 refills | Status: DC

## 2017-10-14 MED ORDER — CYANOCOBALAMIN 1000 MCG/ML IJ SOLN
1000.0000 ug | Freq: Once | INTRAMUSCULAR | Status: AC
Start: 2017-10-14 — End: 2017-10-14
  Administered 2017-10-14: 1000 ug via INTRAMUSCULAR
  Filled 2017-10-14: qty 1

## 2017-10-14 NOTE — Progress Notes (Signed)
Patient here for follow up. No changes since last appt. Patient's BP elevated today 165/70 he is being seen by a cardiologist for BP regulation.

## 2017-11-10 ENCOUNTER — Other Ambulatory Visit: Payer: Self-pay | Admitting: Oncology

## 2017-11-10 NOTE — Progress Notes (Deleted)
[facility] Follow Up visit  Patient Care Team: [name], MD as PCP - General (Internal Medicine)  REASON FOR VISIT Follow up for treatment of anemia   HISTORY OF PRESENTING ILLNESS: Jay Mathis 83 y.o. male with PMH listed as below who presents for follow up of his treatment of anemia.  Pertinent hematology history includes: Patient has recently established care with nephrologist Dr.Kulluru for CKD and was referred to [place] for anemia work up and treatment. Patient at that time reports feeling fatigue, weakness. Noted black stool. He has inguinal hernia for which he has seen surgery and was not recommended for intervention.  He was found to be functional iron deficiency and he has received 2 doses of Feraheme, tolerated well and four weekly dose of 101mcg B12 shot.  Procrit was started on 08/15/2017, he received 40,000 units.   Today patient feels that his energy level has significantly improved. He Obtained a small laceration of her right hand and also bruising while he was trying to cut a tree branch. He has more exercise induration.  Denies chest pain, SOB, abdominal pain, weight loss. Denies any bleeding events. Denies blood in the stool or change of bowel habit.     Review of Systems  Constitutional: Negative for fatigue.  HENT:  Negative.  Negative for nosebleeds and sore throat.   Eyes: Negative.  Negative for eye problems.  Respiratory: Negative.  Negative for cough.   Cardiovascular: Negative.  Negative for chest pain and leg swelling.  Gastrointestinal: Negative.  Negative for abdominal pain.  Endocrine: Negative.   Genitourinary: Negative.  Negative for dysuria, frequency and hematuria.   Musculoskeletal: Negative.  Negative for back pain and myalgias.  Skin: Negative.  Negative for itching and rash.  Neurological: Negative for headaches.  Hematological: Negative.  Negative for adenopathy.  Psychiatric/Behavioral: Negative.  The patient is not  nervous/anxious.     MEDICAL HISTORY: Past Medical History:  Diagnosis Date  . CAD (coronary artery disease)   . CKD (chronic kidney disease) stage 4, GFR 15-29 ml/min (HCC)   . Diabetes mellitus without complication ([place])   . GERD (gastroesophageal reflux disease)   . Hyperlipidemia   . Hypertension   . Iron deficiency anemia 07/30/2017  . Nephrolithiasis     SURGICAL HISTORY: Past Surgical History:  Procedure Laterality Date  . APPENDECTOMY     60 plus years ago  . CORONARY ANGIOPLASTY WITH STENT PLACEMENT    . CORONARY ARTERY BYPASS GRAFT      SOCIAL HISTORY: Social History   Socioeconomic History  . Marital status: Married    Spouse name: Not on file  . Number of children: Not on file  . Years of education: Not on file  . Highest education level: Not on file  Social Needs  . Financial resource strain: Not on file  . Food insecurity - worry: Not on file  . Food insecurity - inability: Not on file  . Transportation needs - medical: Not on file  . Transportation needs - non-medical: Not on file  Occupational History  . Not on file  Tobacco Use  . Smoking status: Former Research scientist (life sciences)  . Smokeless tobacco: Never Used  Substance and Sexual Activity  . Alcohol use: No  . Drug use: No  . Sexual activity: Not on file  Other Topics Concern  . Not on file  Social History Narrative  . Not on file    FAMILY HISTORY Family History  Problem Relation Age of Onset  .  Prostate cancer Father   . Prostate cancer Brother     ALLERGIES:  is allergic to sulfa antibiotics.  MEDICATIONS:  Current Outpatient Medications  Medication Sig Dispense Refill  . amLODipine (NORVASC) 10 MG tablet Take 10 mg by mouth daily.    Marland Kitchen aspirin EC 81 MG tablet Take 81 mg by mouth daily.    Marland Kitchen atorvastatin (LIPITOR) 10 MG tablet Take 10 mg by mouth at bedtime.     . carvedilol (COREG) 3.125 MG tablet Take 3.125 mg 2 (two) times daily by mouth.    . cyanocobalamin 500 MCG tablet Take 2 tablets  (1,000 mcg total) by mouth daily. 60 tablet 3  . ferrous sulfate 325 (65 FE) MG tablet Take 325 mg by mouth daily with breakfast.    . folic acid (FOLVITE) 1 MG tablet Take 1 tablet (1 mg total) daily by mouth. 30 tablet 3  . furosemide (LASIX) 20 MG tablet Take 20 mg by mouth daily.    Marland Kitchen glimepiride (AMARYL) 2 MG tablet Take 4 mg by mouth every evening.     . pantoprazole (PROTONIX) 40 MG tablet Take 40 mg by mouth 2 (two) times daily.    . sodium bicarbonate 650 MG tablet Take 650 mg by mouth 2 (two) times daily.    . tamsulosin (FLOMAX) 0.4 MG CAPS capsule Take 1 capsule by mouth daily.    . Vitamin D, Ergocalciferol, (DRISDOL) 50000 units CAPS capsule Take 50,000 Units by mouth every 7 (seven) days.     No current facility-administered medications for this visit.     PHYSICAL EXAMINATION:  ECOG PERFORMANCE STATUS: 0 - Asymptomatic   There were no vitals filed for this visit.  There were no vitals filed for this visit.   Physical Exam GENERAL: No distress, well nourished.  SKIN:  No rashes or significant lesions  HEAD: Normocephalic, No masses, lesions, tenderness or abnormalities  EYES: Conjunctiva are pink, non icteric ENT: External ears normal ,lips , buccal mucosa, and tongue normal and mucous membranes are moist  LYMPH: No palpable cervical and axillary lymphadenopathy  LUNGS: Clear to auscultation, no crackles or wheezes HEART: Regular rate & rhythm, + systolic murmurs, no gallops, S1 normal and S2 normal  ABDOMEN: Abdomen soft, non-tender, normal bowel sounds, I did not appreciate any  masses or organomegaly  MUSCULOSKELETAL: No CVA tenderness and no tenderness on percussion of the back or rib cage.  EXTREMITIES: No edema, no skin discoloration or tenderness NEURO: Alert & oriented, no focal motor/sensory deficits.   LABORATORY DATA: I have personally reviewed the data as listed:  Appointment on 10/14/2017  Component Date Value Ref Range Status  . Vitamin B-12  10/14/2017 940* 180 - 914 pg/mL Final   Comment: (NOTE) This assay is not validated for testing neonatal or myeloproliferative syndrome specimens for Vitamin B12 levels. Performed at [facility], [place] [address], [place], [place] [address]   . WBC 10/14/2017 5.3  3.8 - 10.6 K/uL Final  . RBC 10/14/2017 3.97* 4.40 - 5.90 MIL/uL Final  . Hemoglobin 10/14/2017 11.0* 13.0 - 18.0 g/dL Final  . HCT 10/14/2017 33.8* 40.0 - 52.0 % Final  . MCV 10/14/2017 85.1  80.0 - 100.0 fL Final  . MCH 10/14/2017 27.7  26.0 - 34.0 pg Final  . MCHC 10/14/2017 32.5  32.0 - 36.0 g/dL Final  . RDW 10/14/2017 14.1  11.5 - 14.5 % Final  . Platelets 10/14/2017 173  150 - 440 K/uL Final  . Neutrophils Relative %  10/14/2017 55  % Final  . Neutro Abs 10/14/2017 3.0  1.4 - 6.5 K/uL Final  . Lymphocytes Relative 10/14/2017 26  % Final  . Lymphs Abs 10/14/2017 1.4  1.0 - 3.6 K/uL Final  . Monocytes Relative 10/14/2017 11  % Final  . Monocytes Absolute 10/14/2017 0.6  0.2 - 1.0 K/uL Final  . Eosinophils Relative 10/14/2017 7  % Final  . Eosinophils Absolute 10/14/2017 0.4  0 - 0.7 K/uL Final  . Basophils Relative 10/14/2017 1  % Final  . Basophils Absolute 10/14/2017 0.0  0 - 0.1 K/uL Final   [facility] results.  07/08/2017 Iron 81, TIBC 298, Saturation 27%, Ferritin 57 Iron/TIBC/Ferritin/ %Sat    Component Value Date/Time   IRON 52 08/13/2017 1049   TIBC 259 08/13/2017 1049   FERRITIN 492 (H) 08/13/2017 1049   IRONPCTSAT 20 08/13/2017 1049   Tested negative for anti parietal antibody or intrinsic factor antibody. He is tested negative for occult blood in stool.   RADIOGRAPHIC STUDIES: I have personally reviewed the radiological images as listed and agree with the findings in the report 06/10/2017 CT abdomen pelvis IMPRESSION: Wide neck small bowel containing indirect right inguinal hernia. Mild stranding of the herniated mesenteric without definite evidence of incarceration. Atrophic appearance of the  kidneys. Multiple urinary bladder diverticulae.  ASSESSMENT/PLAN  No diagnosis found. # Hb improved well at 11. Hold today's procrit.  # s/p 2 does of Feraheme and 4 weekly dose of Vitamin B12 shots. . Iron store is fully replete.   Started on oral B12 supplementation, Rx sent.. # B12 is checked today and the result is pending. He will receive 1 dose of vitamin B12 1000 g intramuscular today. If the level of B12 normalized, he can be continued on oral vitamin B12 supplementation. Otherwise he can be remained on B12 shots monthly.    # Systolic Heart murmur: likely aortic stenosis. Continue take Folate supplementation.  # HTN:And advised patient to continue follow-up with primary care physician to  titrate blood pressure control.  Follow up in 1 month with repeat labs/possible procrit/possible B12 shots.   Earlie Server, MD  11/10/2017 10:19 PM

## 2017-11-10 NOTE — Progress Notes (Addendum)
[name] Follow Up visit  Patient Care Team: [name], MD as PCP - General (Internal Medicine)  REASON FOR VISIT Follow up for treatment of anemia due to CKD.   HISTORY OF PRESENTING ILLNESS: Jay Mathis 83 y.o. male with PMH listed as below who presents for follow up of his treatment of anemia.  Pertinent hematology history includes: Patient has recently established care with nephrologist Dr.Kulluru for CKD and was referred to [place] for anemia work up and treatment. Patient at that time reports feeling fatigue, weakness. Noted black stool. He has inguinal hernia for which he has seen surgery and was not recommended for intervention.  He was found to be functional iron deficiency and he has received 2 doses of Feraheme, tolerated well and four weekly dose of 1048mcg B12 shot.  Procrit was started on 08/15/2017, he received 40,000 units.  09/12/2017 Procrit 20,000 units # s/p 2 does of Feraheme and 4 weekly dose of Vitamin B12 shots. . Iron store is fully replete. Interval History Today patient feels well today. His fatigue level has improved prior to before procrit treatment.  Has more energy to walk around house and do some yard work. Good appetite.    Review of Systems  Constitutional: Negative for appetite change, chills and fatigue.  HENT:  Negative.  Negative for hearing loss and sore throat.   Eyes: Negative.  Negative for eye problems.  Respiratory: Negative.  Negative for cough.   Cardiovascular: Negative.  Negative for chest pain and leg swelling.  Gastrointestinal: Negative.  Negative for abdominal pain.  Endocrine: Negative.   Genitourinary: Negative.  Negative for dysuria, frequency and hematuria.   Musculoskeletal: Negative.  Negative for back pain and myalgias.  Skin: Negative.  Negative for itching and rash.  Neurological: Negative for headaches.  Hematological: Negative.  Negative for adenopathy.  Psychiatric/Behavioral: Negative.  The  patient is not nervous/anxious.     MEDICAL HISTORY: Past Medical History:  Diagnosis Date  . CAD (coronary artery disease)   . CKD (chronic kidney disease) stage 4, GFR 15-29 ml/min (HCC)   . Diabetes mellitus without complication ([place])   . GERD (gastroesophageal reflux disease)   . Hyperlipidemia   . Hypertension   . Iron deficiency anemia 07/30/2017  . Nephrolithiasis     SURGICAL HISTORY: Past Surgical History:  Procedure Laterality Date  . APPENDECTOMY     60 plus years ago  . CORONARY ANGIOPLASTY WITH STENT PLACEMENT    . CORONARY ARTERY BYPASS GRAFT      SOCIAL HISTORY: Social History   Socioeconomic History  . Marital status: Married    Spouse name: Not on file  . Number of children: Not on file  . Years of education: Not on file  . Highest education level: Not on file  Social Needs  . Financial resource strain: Not on file  . Food insecurity - worry: Not on file  . Food insecurity - inability: Not on file  . Transportation needs - medical: Not on file  . Transportation needs - non-medical: Not on file  Occupational History  . Not on file  Tobacco Use  . Smoking status: Former Research scientist (life sciences)  . Smokeless tobacco: Never Used  Substance and Sexual Activity  . Alcohol use: No  . Drug use: No  . Sexual activity: Not on file  Other Topics Concern  . Not on file  Social History Narrative  . Not on file    FAMILY HISTORY Family History  Problem Relation  Age of Onset  . Prostate cancer Father   . Prostate cancer Brother     ALLERGIES:  is allergic to sulfa antibiotics.  MEDICATIONS:  Current Outpatient Medications  Medication Sig Dispense Refill  . amLODipine (NORVASC) 10 MG tablet Take 10 mg by mouth daily.    Marland Kitchen aspirin EC 81 MG tablet Take 81 mg by mouth daily.    Marland Kitchen atorvastatin (LIPITOR) 10 MG tablet Take 10 mg by mouth at bedtime.     . carvedilol (COREG) 3.125 MG tablet Take 3.125 mg 2 (two) times daily by mouth.    . cyanocobalamin 500 MCG tablet  Take 2 tablets (1,000 mcg total) by mouth daily. 60 tablet 3  . ferrous sulfate 325 (65 FE) MG tablet Take 325 mg by mouth daily with breakfast.    . folic acid (FOLVITE) 1 MG tablet Take 1 tablet (1 mg total) daily by mouth. 30 tablet 3  . furosemide (LASIX) 20 MG tablet Take 20 mg by mouth daily.    Marland Kitchen glimepiride (AMARYL) 2 MG tablet Take 4 mg by mouth every evening.     . pantoprazole (PROTONIX) 40 MG tablet Take 40 mg by mouth 2 (two) times daily.    . sodium bicarbonate 650 MG tablet Take 650 mg by mouth 2 (two) times daily.    . tamsulosin (FLOMAX) 0.4 MG CAPS capsule Take 1 capsule by mouth daily.    . Vitamin D, Ergocalciferol, (DRISDOL) 50000 units CAPS capsule Take 50,000 Units by mouth every 7 (seven) days.     No current facility-administered medications for this visit.     PHYSICAL EXAMINATION:  ECOG PERFORMANCE STATUS: 0 - Asymptomatic   There were no vitals filed for this visit.  There were no vitals filed for this visit.   Physical Exam GENERAL: No distress, well nourished.  SKIN:  No rashes or significant lesions  HEAD: Normocephalic, No masses, lesions, tenderness or abnormalities  EYES: Conjunctiva are pink, non icteric ENT: External ears normal ,lips , buccal mucosa, and tongue normal and mucous membranes are moist  LYMPH: No palpable cervical and axillary lymphadenopathy  LUNGS: Clear to auscultation, no crackles or wheezes HEART: Regular rate & rhythm, + systolic murmurs, no gallops, S1 normal and S2 normal  ABDOMEN: Abdomen soft, non-tender, normal bowel sounds, I did not appreciate any  masses or organomegaly  MUSCULOSKELETAL: No CVA tenderness and no tenderness on percussion of the back or rib cage.  EXTREMITIES: No edema, no skin discoloration or tenderness NEURO: Alert & oriented, no focal motor/sensory deficits.   LABORATORY DATA: I have personally reviewed the data as listed:  Appointment on 10/14/2017  Component Date Value Ref Range Status  .  Vitamin B-12 10/14/2017 940* 180 - 914 pg/mL Final   Comment: (NOTE) This assay is not validated for testing neonatal or myeloproliferative syndrome specimens for Vitamin B12 levels. Performed at [facility], [place] [address], [place], [place] [address]   . WBC 10/14/2017 5.3  3.8 - 10.6 K/uL Final  . RBC 10/14/2017 3.97* 4.40 - 5.90 MIL/uL Final  . Hemoglobin 10/14/2017 11.0* 13.0 - 18.0 g/dL Final  . HCT 10/14/2017 33.8* 40.0 - 52.0 % Final  . MCV 10/14/2017 85.1  80.0 - 100.0 fL Final  . MCH 10/14/2017 27.7  26.0 - 34.0 pg Final  . MCHC 10/14/2017 32.5  32.0 - 36.0 g/dL Final  . RDW 10/14/2017 14.1  11.5 - 14.5 % Final  . Platelets 10/14/2017 173  150 - 440 K/uL  Final  . Neutrophils Relative % 10/14/2017 55  % Final  . Neutro Abs 10/14/2017 3.0  1.4 - 6.5 K/uL Final  . Lymphocytes Relative 10/14/2017 26  % Final  . Lymphs Abs 10/14/2017 1.4  1.0 - 3.6 K/uL Final  . Monocytes Relative 10/14/2017 11  % Final  . Monocytes Absolute 10/14/2017 0.6  0.2 - 1.0 K/uL Final  . Eosinophils Relative 10/14/2017 7  % Final  . Eosinophils Absolute 10/14/2017 0.4  0 - 0.7 K/uL Final  . Basophils Relative 10/14/2017 1  % Final  . Basophils Absolute 10/14/2017 0.0  0 - 0.1 K/uL Final   [facility] results.  07/08/2017 Iron 81, TIBC 298, Saturation 27%, Ferritin 57 Iron/TIBC/Ferritin/ %Sat    Component Value Date/Time   IRON 52 08/13/2017 1049   TIBC 259 08/13/2017 1049   FERRITIN 492 (H) 08/13/2017 1049   IRONPCTSAT 20 08/13/2017 1049   Tested negative for anti parietal antibody or intrinsic factor antibody. He is tested negative for occult blood in stool.   RADIOGRAPHIC STUDIES: I have personally reviewed the radiological images as listed and agree with the findings in the report 06/10/2017 CT abdomen pelvis IMPRESSION: Wide neck small bowel containing indirect right inguinal hernia. Mild stranding of the herniated mesenteric without definite evidence of incarceration. Atrophic  appearance of the kidneys. Multiple urinary bladder diverticulae.  ASSESSMENT/PLAN  1. Anemia in stage 4 chronic kidney disease ([place])   2. Iron deficiency anemia, unspecified iron deficiency anemia type   3. CKD (chronic kidney disease) stage 4, GFR 15-29 ml/min (HCC)    # Hb improved well at 10.4, hold Procrit. .  # Vitamin B12 levels have normalized. No need for B12 injection today.     # Systolic Heart murmur: likely aortic stenosis. Continue take Folate supplementation.  # HTN: follow up with pcp for blood pressure control.  Follow up in 1 month with repeat labs/possible procrit   Earlie Server, MD, PhD Hematology Oncology [place] at [facility] Pager- [phone]

## 2017-11-11 ENCOUNTER — Inpatient Hospital Stay: Payer: Medicare HMO | Attending: Oncology | Admitting: Oncology

## 2017-11-11 ENCOUNTER — Inpatient Hospital Stay: Payer: Medicare HMO

## 2017-11-11 ENCOUNTER — Encounter: Payer: Self-pay | Admitting: Oncology

## 2017-11-11 VITALS — BP 162/64 | HR 56 | Temp 97.1°F | Resp 16 | Wt 159.0 lb

## 2017-11-11 DIAGNOSIS — N184 Chronic kidney disease, stage 4 (severe): Secondary | ICD-10-CM

## 2017-11-11 DIAGNOSIS — E119 Type 2 diabetes mellitus without complications: Secondary | ICD-10-CM | POA: Insufficient documentation

## 2017-11-11 DIAGNOSIS — D509 Iron deficiency anemia, unspecified: Secondary | ICD-10-CM

## 2017-11-11 DIAGNOSIS — R5383 Other fatigue: Secondary | ICD-10-CM | POA: Diagnosis not present

## 2017-11-11 DIAGNOSIS — Z79899 Other long term (current) drug therapy: Secondary | ICD-10-CM | POA: Diagnosis not present

## 2017-11-11 DIAGNOSIS — Z8052 Family history of malignant neoplasm of bladder: Secondary | ICD-10-CM | POA: Diagnosis not present

## 2017-11-11 DIAGNOSIS — D500 Iron deficiency anemia secondary to blood loss (chronic): Secondary | ICD-10-CM | POA: Insufficient documentation

## 2017-11-11 DIAGNOSIS — I2510 Atherosclerotic heart disease of native coronary artery without angina pectoris: Secondary | ICD-10-CM | POA: Insufficient documentation

## 2017-11-11 DIAGNOSIS — D631 Anemia in chronic kidney disease: Secondary | ICD-10-CM

## 2017-11-11 DIAGNOSIS — Z7982 Long term (current) use of aspirin: Secondary | ICD-10-CM | POA: Insufficient documentation

## 2017-11-11 DIAGNOSIS — K449 Diaphragmatic hernia without obstruction or gangrene: Secondary | ICD-10-CM | POA: Diagnosis not present

## 2017-11-11 DIAGNOSIS — R531 Weakness: Secondary | ICD-10-CM | POA: Insufficient documentation

## 2017-11-11 DIAGNOSIS — D519 Vitamin B12 deficiency anemia, unspecified: Secondary | ICD-10-CM

## 2017-11-11 DIAGNOSIS — R011 Cardiac murmur, unspecified: Secondary | ICD-10-CM | POA: Insufficient documentation

## 2017-11-11 DIAGNOSIS — I129 Hypertensive chronic kidney disease with stage 1 through stage 4 chronic kidney disease, or unspecified chronic kidney disease: Secondary | ICD-10-CM | POA: Insufficient documentation

## 2017-11-11 DIAGNOSIS — E785 Hyperlipidemia, unspecified: Secondary | ICD-10-CM | POA: Insufficient documentation

## 2017-11-11 DIAGNOSIS — K219 Gastro-esophageal reflux disease without esophagitis: Secondary | ICD-10-CM | POA: Diagnosis not present

## 2017-11-11 DIAGNOSIS — Z87891 Personal history of nicotine dependence: Secondary | ICD-10-CM | POA: Insufficient documentation

## 2017-11-11 DIAGNOSIS — Z7984 Long term (current) use of oral hypoglycemic drugs: Secondary | ICD-10-CM | POA: Diagnosis not present

## 2017-11-11 LAB — CBC WITH DIFFERENTIAL/PLATELET
Basophils Absolute: 0 10*3/uL (ref 0–0.1)
Basophils Relative: 1 %
Eosinophils Absolute: 0.4 10*3/uL (ref 0–0.7)
Eosinophils Relative: 8 %
HCT: 31.1 % — ABNORMAL LOW (ref 40.0–52.0)
Hemoglobin: 10.4 g/dL — ABNORMAL LOW (ref 13.0–18.0)
Lymphocytes Relative: 29 %
Lymphs Abs: 1.5 10*3/uL (ref 1.0–3.6)
MCH: 27.6 pg (ref 26.0–34.0)
MCHC: 33.3 g/dL (ref 32.0–36.0)
MCV: 82.9 fL (ref 80.0–100.0)
Monocytes Absolute: 0.6 10*3/uL (ref 0.2–1.0)
Monocytes Relative: 11 %
Neutro Abs: 2.8 10*3/uL (ref 1.4–6.5)
Neutrophils Relative %: 51 %
Platelets: 159 10*3/uL (ref 150–440)
RBC: 3.76 MIL/uL — ABNORMAL LOW (ref 4.40–5.90)
RDW: 14.2 % (ref 11.5–14.5)
WBC: 5.4 10*3/uL (ref 3.8–10.6)

## 2017-11-11 NOTE — Progress Notes (Signed)
Patient here for follow up with labs and possible Procrit and possible B-12 injection. Patient states that he had an episode of indigestion with some weakness last night that lasted about an hour. He drank some water and rested and it went away.

## 2017-12-10 NOTE — Progress Notes (Signed)
[facility] Follow Up visit  Patient Care Team: [name], MD as PCP - General (Internal Medicine)  REASON FOR VISIT Follow up for treatment of anemia due to CKD.   HISTORY OF PRESENTING ILLNESS: LOWEN BARRINGER 84 y.o. male with PMH listed as below who presents for follow up of his treatment of anemia.  Pertinent hematology history includes: Patient has recently established care with nephrologist Dr.Kulluru for CKD and was referred to [place] for anemia work up and treatment. Patient at that time reports feeling fatigue, weakness. Noted black stool. He has inguinal hernia for which he has seen surgery and was not recommended for intervention.  He was found to be functional iron deficiency and he has received 2 doses of Feraheme, tolerated well and four weekly dose of 1053mcg B12 shot.  Procrit was started on 08/15/2017, he received 40,000 units.  09/12/2017 Procrit 20,000 units # s/p 2 does of Feraheme and 4 weekly dose of Vitamin B12 shots. . Iron store is fully replete.  Interval History Today patient feels well today. His energy level is good. Denies any SOB, chest pain. Appetite is good.    Review of Systems  Constitutional: Negative for appetite change, chills and fatigue.  HENT:  Negative.  Negative for hearing loss, lump/mass and sore throat.   Eyes: Negative.  Negative for eye problems and icterus.  Respiratory: Negative.  Negative for cough and hemoptysis.   Cardiovascular: Negative.  Negative for chest pain and leg swelling.  Gastrointestinal: Negative.  Negative for abdominal pain and constipation.  Endocrine: Negative.   Genitourinary: Negative.  Negative for dysuria, frequency and hematuria.   Musculoskeletal: Negative.  Negative for back pain, flank pain and myalgias.  Skin: Negative.  Negative for itching and rash.  Neurological: Negative for dizziness and headaches.  Hematological: Negative.  Negative for adenopathy. Does not bruise/bleed easily.   Psychiatric/Behavioral: Negative.  Negative for confusion. The patient is not nervous/anxious.     MEDICAL HISTORY: Past Medical History:  Diagnosis Date  . CAD (coronary artery disease)   . CKD (chronic kidney disease) stage 4, GFR 15-29 ml/min (HCC)   . Diabetes mellitus without complication ([place])   . GERD (gastroesophageal reflux disease)   . Hyperlipidemia   . Hypertension   . Iron deficiency anemia 07/30/2017  . Nephrolithiasis     SURGICAL HISTORY: Past Surgical History:  Procedure Laterality Date  . APPENDECTOMY     60 plus years ago  . CORONARY ANGIOPLASTY WITH STENT PLACEMENT    . CORONARY ARTERY BYPASS GRAFT      SOCIAL HISTORY: Social History   Socioeconomic History  . Marital status: Married    Spouse name: Not on file  . Number of children: Not on file  . Years of education: Not on file  . Highest education level: Not on file  Social Needs  . Financial resource strain: Not on file  . Food insecurity - worry: Not on file  . Food insecurity - inability: Not on file  . Transportation needs - medical: Not on file  . Transportation needs - non-medical: Not on file  Occupational History  . Not on file  Tobacco Use  . Smoking status: Former Research scientist (life sciences)  . Smokeless tobacco: Never Used  Substance and Sexual Activity  . Alcohol use: No  . Drug use: No  . Sexual activity: Not on file  Other Topics Concern  . Not on file  Social History Narrative  . Not on file  FAMILY HISTORY Family History  Problem Relation Age of Onset  . Prostate cancer Father   . Prostate cancer Brother     ALLERGIES:  is allergic to sulfa antibiotics.  MEDICATIONS:  Current Outpatient Medications  Medication Sig Dispense Refill  . amLODipine (NORVASC) 10 MG tablet Take 10 mg by mouth daily.    Marland Kitchen aspirin EC 81 MG tablet Take 81 mg by mouth daily.    Marland Kitchen atorvastatin (LIPITOR) 10 MG tablet Take 10 mg by mouth at bedtime.     . carvedilol (COREG) 3.125 MG tablet Take 3.125 mg 2  (two) times daily by mouth.    . cyanocobalamin 500 MCG tablet Take 2 tablets (1,000 mcg total) by mouth daily. 60 tablet 3  . ferrous sulfate 325 (65 FE) MG tablet Take 325 mg by mouth daily with breakfast.    . folic acid (FOLVITE) 1 MG tablet Take 1 tablet (1 mg total) daily by mouth. 30 tablet 3  . furosemide (LASIX) 20 MG tablet Take 20 mg by mouth daily.    Marland Kitchen glimepiride (AMARYL) 2 MG tablet Take 4 mg by mouth every evening.     . pantoprazole (PROTONIX) 40 MG tablet Take 40 mg by mouth 2 (two) times daily.    . sodium bicarbonate 650 MG tablet Take 650 mg by mouth 2 (two) times daily.    . tamsulosin (FLOMAX) 0.4 MG CAPS capsule Take 1 capsule by mouth daily.    . Vitamin D, Ergocalciferol, (DRISDOL) 50000 units CAPS capsule Take 50,000 Units by mouth every 7 (seven) days.     No current facility-administered medications for this visit.     PHYSICAL EXAMINATION:  ECOG PERFORMANCE STATUS: 0 - Asymptomatic   Vitals:   12/11/17 0953  BP: (!) 166/61  Pulse: 65  Resp: 18  Temp: (!) 95.3 F (35.2 C)    Filed Weights   12/11/17 0953  Weight: 157 lb 11.2 oz (71.5 kg)     Physical Exam  GENERAL: No distress, well nourished.  SKIN:  No rashes or significant lesions  HEAD: Normocephalic, No masses, lesions, tenderness or abnormalities  EYES: Conjunctiva are pink, non icteric ENT: External ears normal ,lips , buccal mucosa, and tongue normal and mucous membranes are moist  LYMPH: No palpable cervical and axillary lymphadenopathy  LUNGS: Clear to auscultation, no crackles or wheezes HEART: Regular rate & rhythm,  + systolic murmurs,  no gallops, S1 normal and S2 normal  ABDOMEN: Abdomen soft, non-tender, normal bowel sounds, I did not appreciate any  masses or organomegaly  MUSCULOSKELETAL: No CVA tenderness and no tenderness on percussion of the back or rib cage.  EXTREMITIES:1+ LE edema, no skin discoloration or tenderness NEURO: Alert & oriented, no focal motor/sensory  deficits.   LABORATORY DATA: I have personally reviewed the data as listed:  Appointment on 12/11/2017  Component Date Value Ref Range Status  . WBC 12/11/2017 5.3  3.8 - 10.6 K/uL Final  . RBC 12/11/2017 3.74* 4.40 - 5.90 MIL/uL Final  . Hemoglobin 12/11/2017 10.2* 13.0 - 18.0 g/dL Final  . HCT 12/11/2017 31.1* 40.0 - 52.0 % Final  . MCV 12/11/2017 83.2  80.0 - 100.0 fL Final  . MCH 12/11/2017 27.3  26.0 - 34.0 pg Final  . MCHC 12/11/2017 32.8  32.0 - 36.0 g/dL Final  . RDW 12/11/2017 14.5  11.5 - 14.5 % Final  . Platelets 12/11/2017 150  150 - 440 K/uL Final  . Neutrophils Relative % 12/11/2017 54  % Final  .  Neutro Abs 12/11/2017 2.9  1.4 - 6.5 K/uL Final  . Lymphocytes Relative 12/11/2017 27  % Final  . Lymphs Abs 12/11/2017 1.4  1.0 - 3.6 K/uL Final  . Monocytes Relative 12/11/2017 10  % Final  . Monocytes Absolute 12/11/2017 0.5  0.2 - 1.0 K/uL Final  . Eosinophils Relative 12/11/2017 8  % Final  . Eosinophils Absolute 12/11/2017 0.4  0 - 0.7 K/uL Final  . Basophils Relative 12/11/2017 1  % Final  . Basophils Absolute 12/11/2017 0.0  0 - 0.1 K/uL Final   Performed at [facility], [address], [place], [place] [address]  Appointment on 11/11/2017  Component Date Value Ref Range Status  . WBC 11/11/2017 5.4  3.8 - 10.6 K/uL Final  . RBC 11/11/2017 3.76* 4.40 - 5.90 MIL/uL Final  . Hemoglobin 11/11/2017 10.4* 13.0 - 18.0 g/dL Final  . HCT 11/11/2017 31.1* 40.0 - 52.0 % Final  . MCV 11/11/2017 82.9  80.0 - 100.0 fL Final  . MCH 11/11/2017 27.6  26.0 - 34.0 pg Final  . MCHC 11/11/2017 33.3  32.0 - 36.0 g/dL Final  . RDW 11/11/2017 14.2  11.5 - 14.5 % Final  . Platelets 11/11/2017 159  150 - 440 K/uL Final  . Neutrophils Relative % 11/11/2017 51  % Final  . Neutro Abs 11/11/2017 2.8  1.4 - 6.5 K/uL Final  . Lymphocytes Relative 11/11/2017 29  % Final  . Lymphs Abs 11/11/2017 1.5  1.0 - 3.6 K/uL Final  . Monocytes Relative 11/11/2017 11  % Final  . Monocytes  Absolute 11/11/2017 0.6  0.2 - 1.0 K/uL Final  . Eosinophils Relative 11/11/2017 8  % Final  . Eosinophils Absolute 11/11/2017 0.4  0 - 0.7 K/uL Final  . Basophils Relative 11/11/2017 1  % Final  . Basophils Absolute 11/11/2017 0.0  0 - 0.1 K/uL Final   [facility] results.  07/08/2017 Iron 81, TIBC 298, Saturation 27%, Ferritin 57 Iron/TIBC/Ferritin/ %Sat    Component Value Date/Time   IRON 52 08/13/2017 1049   TIBC 259 08/13/2017 1049   FERRITIN 492 (H) 08/13/2017 1049   IRONPCTSAT 20 08/13/2017 1049   Tested negative for anti parietal antibody or intrinsic factor antibody. He is tested negative for occult blood in stool.   RADIOGRAPHIC STUDIES: I have personally reviewed the radiological images as listed and agree with the findings in the report 06/10/2017 CT abdomen pelvis IMPRESSION: Wide neck small bowel containing indirect right inguinal hernia. Mild stranding of the herniated mesenteric without definite evidence of incarceration. Atrophic appearance of the kidneys. Multiple urinary bladder diverticulae.  ASSESSMENT/PLAN  1. Anemia in stage 4 chronic kidney disease ([place])   2. CKD (chronic kidney disease) stage 4, GFR 15-29 ml/min (HCC)   3. Iron deficiency anemia, unspecified iron deficiency anemia type    # Hb is 10.2, above 10, will hold Procrit. Continue monitor.   # Systolic Heart murmur: likely aortic stenosis. Continue take Folate supplementation.  # HTN: follow up with pcp for blood pressure control.  Follow up in 4 weeks with repeat labs/possible procrit   Earlie Server, MD, PhD Hematology Oncology [place] at [facility] Pager- [phone]

## 2017-12-11 ENCOUNTER — Inpatient Hospital Stay (HOSPITAL_BASED_OUTPATIENT_CLINIC_OR_DEPARTMENT_OTHER): Payer: Medicare HMO | Admitting: Oncology

## 2017-12-11 ENCOUNTER — Inpatient Hospital Stay: Payer: Medicare HMO | Attending: Oncology

## 2017-12-11 ENCOUNTER — Inpatient Hospital Stay: Payer: Medicare HMO

## 2017-12-11 ENCOUNTER — Encounter: Payer: Self-pay | Admitting: Oncology

## 2017-12-11 ENCOUNTER — Other Ambulatory Visit: Payer: Self-pay

## 2017-12-11 VITALS — BP 166/61 | HR 65 | Temp 95.3°F | Resp 18 | Wt 157.7 lb

## 2017-12-11 DIAGNOSIS — Z87891 Personal history of nicotine dependence: Secondary | ICD-10-CM | POA: Diagnosis not present

## 2017-12-11 DIAGNOSIS — K219 Gastro-esophageal reflux disease without esophagitis: Secondary | ICD-10-CM | POA: Insufficient documentation

## 2017-12-11 DIAGNOSIS — Z87442 Personal history of urinary calculi: Secondary | ICD-10-CM | POA: Insufficient documentation

## 2017-12-11 DIAGNOSIS — I2510 Atherosclerotic heart disease of native coronary artery without angina pectoris: Secondary | ICD-10-CM | POA: Insufficient documentation

## 2017-12-11 DIAGNOSIS — E785 Hyperlipidemia, unspecified: Secondary | ICD-10-CM | POA: Insufficient documentation

## 2017-12-11 DIAGNOSIS — K4090 Unilateral inguinal hernia, without obstruction or gangrene, not specified as recurrent: Secondary | ICD-10-CM | POA: Diagnosis not present

## 2017-12-11 DIAGNOSIS — R011 Cardiac murmur, unspecified: Secondary | ICD-10-CM | POA: Insufficient documentation

## 2017-12-11 DIAGNOSIS — D509 Iron deficiency anemia, unspecified: Secondary | ICD-10-CM | POA: Diagnosis not present

## 2017-12-11 DIAGNOSIS — Z79899 Other long term (current) drug therapy: Secondary | ICD-10-CM | POA: Diagnosis not present

## 2017-12-11 DIAGNOSIS — Z8042 Family history of malignant neoplasm of prostate: Secondary | ICD-10-CM | POA: Insufficient documentation

## 2017-12-11 DIAGNOSIS — Z7982 Long term (current) use of aspirin: Secondary | ICD-10-CM

## 2017-12-11 DIAGNOSIS — D631 Anemia in chronic kidney disease: Secondary | ICD-10-CM | POA: Diagnosis not present

## 2017-12-11 DIAGNOSIS — E119 Type 2 diabetes mellitus without complications: Secondary | ICD-10-CM | POA: Diagnosis not present

## 2017-12-11 DIAGNOSIS — D519 Vitamin B12 deficiency anemia, unspecified: Secondary | ICD-10-CM

## 2017-12-11 DIAGNOSIS — I129 Hypertensive chronic kidney disease with stage 1 through stage 4 chronic kidney disease, or unspecified chronic kidney disease: Secondary | ICD-10-CM | POA: Insufficient documentation

## 2017-12-11 DIAGNOSIS — N184 Chronic kidney disease, stage 4 (severe): Secondary | ICD-10-CM

## 2017-12-11 DIAGNOSIS — Z9049 Acquired absence of other specified parts of digestive tract: Secondary | ICD-10-CM | POA: Diagnosis not present

## 2017-12-11 LAB — CBC WITH DIFFERENTIAL/PLATELET
BASOS ABS: 0 10*3/uL (ref 0–0.1)
Basophils Relative: 1 %
EOS PCT: 8 %
Eosinophils Absolute: 0.4 10*3/uL (ref 0–0.7)
HCT: 31.1 % — ABNORMAL LOW (ref 40.0–52.0)
HEMOGLOBIN: 10.2 g/dL — AB (ref 13.0–18.0)
LYMPHS ABS: 1.4 10*3/uL (ref 1.0–3.6)
Lymphocytes Relative: 27 %
MCH: 27.3 pg (ref 26.0–34.0)
MCHC: 32.8 g/dL (ref 32.0–36.0)
MCV: 83.2 fL (ref 80.0–100.0)
Monocytes Absolute: 0.5 10*3/uL (ref 0.2–1.0)
Monocytes Relative: 10 %
NEUTROS ABS: 2.9 10*3/uL (ref 1.4–6.5)
Neutrophils Relative %: 54 %
PLATELETS: 150 10*3/uL (ref 150–440)
RBC: 3.74 MIL/uL — AB (ref 4.40–5.90)
RDW: 14.5 % (ref 11.5–14.5)
WBC: 5.3 10*3/uL (ref 3.8–10.6)

## 2017-12-11 NOTE — Progress Notes (Signed)
Here for follow up

## 2018-01-06 ENCOUNTER — Telehealth: Payer: Self-pay | Admitting: *Deleted

## 2018-01-06 NOTE — Telephone Encounter (Signed)
-----   Message from Wilburn Cornelia sent at 01/06/2018  9:52 AM EST ----- Regarding: R/S appts Contact: [phone] R/S appts from 1-30 to 2-18-pt had stent placed and cant drive for 2-3 wks.

## 2018-01-08 ENCOUNTER — Ambulatory Visit: Payer: Medicare HMO

## 2018-01-08 ENCOUNTER — Ambulatory Visit: Payer: Medicare HMO | Admitting: Oncology

## 2018-01-08 ENCOUNTER — Other Ambulatory Visit: Payer: Medicare HMO

## 2018-01-26 NOTE — Progress Notes (Signed)
[facility] Follow Up visit  Patient Care Team: [name], MD as PCP - General (Internal Medicine)  REASON FOR VISIT Follow up for treatment of anemia due to CKD.   HISTORY OF PRESENTING ILLNESS: Jay Mathis 84 y.o. male with PMH listed as below who presents for follow up of his treatment of anemia.  Pertinent hematology history includes: Patient has recently established care with nephrologist Dr.Kulluru for CKD and was referred to [place] for anemia work up and treatment. Patient at that time reports feeling fatigue, weakness. Noted black stool. He has inguinal hernia for which he has seen surgery and was not recommended for intervention.  He was found to be functional iron deficiency and he has received 2 doses of Feraheme, tolerated well and four weekly dose of 1039mcg B12 shot.  Procrit was started on 08/15/2017, he received 40,000 units.  09/12/2017 Procrit 20,000 units # s/p 2 does of Feraheme and 4 weekly dose of Vitamin B12 shots. . Iron store is fully replete.  Interval History Today patient feels well today.  Patient has good energy level.  Appetite is good.  Denies any shortness of breath, chest pain, abdominal pain.  His chronic lower extremity swelling has improved.    Review of Systems  Constitutional: Negative for appetite change, chills and fatigue.  HENT:   Negative for hearing loss, lump/mass and sore throat.   Eyes: Negative for eye problems and icterus.  Respiratory: Negative for cough and hemoptysis.   Cardiovascular: Negative for chest pain and leg swelling.  Gastrointestinal: Negative for abdominal distention, abdominal pain and constipation.  Genitourinary: Negative for dysuria, frequency and hematuria.   Musculoskeletal: Negative for back pain, flank pain and myalgias.  Skin: Negative for itching and rash.  Neurological: Negative for dizziness and headaches.  Hematological: Negative for adenopathy. Does not bruise/bleed easily.   Psychiatric/Behavioral: Negative for confusion and depression. The patient is not nervous/anxious.     MEDICAL HISTORY: Past Medical History:  Diagnosis Date  . CAD (coronary artery disease)   . CKD (chronic kidney disease) stage 4, GFR 15-29 ml/min (HCC)   . Diabetes mellitus without complication ([place])   . GERD (gastroesophageal reflux disease)   . Hyperlipidemia   . Hypertension   . Iron deficiency anemia 07/30/2017  . Nephrolithiasis     SURGICAL HISTORY: Past Surgical History:  Procedure Laterality Date  . APPENDECTOMY     60 plus years ago  . CORONARY ANGIOPLASTY WITH STENT PLACEMENT    . CORONARY ARTERY BYPASS GRAFT      SOCIAL HISTORY: Social History   Socioeconomic History  . Marital status: Married    Spouse name: Not on file  . Number of children: Not on file  . Years of education: Not on file  . Highest education level: Not on file  Social Needs  . Financial resource strain: Not on file  . Food insecurity - worry: Not on file  . Food insecurity - inability: Not on file  . Transportation needs - medical: Not on file  . Transportation needs - non-medical: Not on file  Occupational History  . Not on file  Tobacco Use  . Smoking status: Former Research scientist (life sciences)  . Smokeless tobacco: Never Used  . Tobacco comment: quit about 60 years ago 2019 dated  Substance and Sexual Activity  . Alcohol use: No  . Drug use: No  . Sexual activity: Not on file  Other Topics Concern  . Not on file  Social History Narrative  .  Not on file    FAMILY HISTORY Family History  Problem Relation Age of Onset  . Prostate cancer Father   . Prostate cancer Brother     ALLERGIES:  is allergic to sulfa antibiotics.  MEDICATIONS:  Current Outpatient Medications  Medication Sig Dispense Refill  . amLODipine (NORVASC) 10 MG tablet Take 10 mg by mouth daily.    Marland Kitchen aspirin EC 81 MG tablet Take 81 mg by mouth daily.    Marland Kitchen atorvastatin (LIPITOR) 10 MG tablet Take 10 mg by mouth at bedtime.      . carvedilol (COREG) 3.125 MG tablet Take 3.125 mg 2 (two) times daily by mouth.    . cyanocobalamin 500 MCG tablet Take 2 tablets (1,000 mcg total) by mouth daily. 60 tablet 3  . ferrous sulfate 325 (65 FE) MG tablet Take 325 mg by mouth 2 (two) times daily with a meal.     . folic acid (FOLVITE) 1 MG tablet Take 1 tablet (1 mg total) daily by mouth. 30 tablet 3  . furosemide (LASIX) 20 MG tablet Take 20 mg by mouth daily.    Marland Kitchen glimepiride (AMARYL) 2 MG tablet Take 2 mg by mouth every evening.     . pantoprazole (PROTONIX) 40 MG tablet Take 40 mg by mouth 2 (two) times daily.    . sodium bicarbonate 650 MG tablet Take 650 mg by mouth 2 (two) times daily.    . tamsulosin (FLOMAX) 0.4 MG CAPS capsule Take 1 capsule by mouth daily.    . Vitamin D, Ergocalciferol, (DRISDOL) 50000 units CAPS capsule Take 50,000 Units by mouth every 7 (seven) days.     No current facility-administered medications for this visit.     PHYSICAL EXAMINATION:  ECOG PERFORMANCE STATUS: 0 - Asymptomatic   Vitals:   01/27/18 1034  BP: (!) 172/64  Pulse: 66  Resp: 18  Temp: 97.7 F (36.5 C)    Filed Weights   01/27/18 1034  Weight: 154 lb 1.6 oz (69.9 kg)     Physical Exam  GENERAL: No distress, well nourished.  SKIN:  No rashes or significant lesions  HEAD: Normocephalic, No masses, lesions, tenderness or abnormalities  EYES: Conjunctiva are pink, non icteric ENT: External ears normal ,lips , buccal mucosa, and tongue normal and mucous membranes are moist  LYMPH: No palpable cervical and axillary lymphadenopathy  LUNGS: Clear to auscultation, no crackles or wheezes HEART: Regular rate & rhythm, systolic murmurs, no gallops, S1 normal and S2 normal  ABDOMEN: Abdomen soft, non-tender, normal bowel sounds, I did not appreciate any  masses or organomegaly  MUSCULOSKELETAL: No CVA tenderness and no tenderness on percussion of the back or rib cage.  EXTREMITIES: Trace edema, no skin discoloration or  tenderness NEURO: Alert & oriented, no focal motor/sensory deficits.    LABORATORY DATA: I have personally reviewed the data as listed:  No visits with results within 1 Month(s) from this visit.  Latest known visit with results is:  Appointment on 12/11/2017  Component Date Value Ref Range Status  . WBC 12/11/2017 5.3  3.8 - 10.6 K/uL Final  . RBC 12/11/2017 3.74* 4.40 - 5.90 MIL/uL Final  . Hemoglobin 12/11/2017 10.2* 13.0 - 18.0 g/dL Final  . HCT 12/11/2017 31.1* 40.0 - 52.0 % Final  . MCV 12/11/2017 83.2  80.0 - 100.0 fL Final  . MCH 12/11/2017 27.3  26.0 - 34.0 pg Final  . MCHC 12/11/2017 32.8  32.0 - 36.0 g/dL Final  . RDW 12/11/2017 14.5  11.5 - 14.5 % Final  . Platelets 12/11/2017 150  150 - 440 K/uL Final  . Neutrophils Relative % 12/11/2017 54  % Final  . Neutro Abs 12/11/2017 2.9  1.4 - 6.5 K/uL Final  . Lymphocytes Relative 12/11/2017 27  % Final  . Lymphs Abs 12/11/2017 1.4  1.0 - 3.6 K/uL Final  . Monocytes Relative 12/11/2017 10  % Final  . Monocytes Absolute 12/11/2017 0.5  0.2 - 1.0 K/uL Final  . Eosinophils Relative 12/11/2017 8  % Final  . Eosinophils Absolute 12/11/2017 0.4  0 - 0.7 K/uL Final  . Basophils Relative 12/11/2017 1  % Final  . Basophils Absolute 12/11/2017 0.0  0 - 0.1 K/uL Final   Performed at [facility], [address], [place], [place] [address]   [facility] results.  07/08/2017 Iron 81, TIBC 298, Saturation 27%, Ferritin 57 Iron/TIBC/Ferritin/ %Sat    Component Value Date/Time   IRON 52 08/13/2017 1049   TIBC 259 08/13/2017 1049   FERRITIN 492 (H) 08/13/2017 1049   IRONPCTSAT 20 08/13/2017 1049   Tested negative for anti parietal antibody or intrinsic factor antibody. He is tested negative for occult blood in stool.   RADIOGRAPHIC STUDIES: I have personally reviewed the radiological images as listed and agree with the findings in the report 06/10/2017 CT abdomen pelvis IMPRESSION: Wide neck small bowel containing indirect right  inguinal hernia. Mild stranding of the herniated mesenteric without definite evidence of incarceration. Atrophic appearance of the kidneys. Multiple urinary bladder diverticulae.  ASSESSMENT/PLAN  1. CKD (chronic kidney disease) stage 4, GFR 15-29 ml/min (HCC)   2. Anemia in stage 4 chronic kidney disease (HCC)   3. Heart murmur   4. Other iron deficiency anemia    # Hb is 10.6, above 10, will continue to hold Procrit. Continue monitor.  Discussed with patient that since he is hemoglobin has been stable for the past few months, we will plan to see him back in 2 months with repeat labs and possible Procrit. # Systolic Heart murmur: likely aortic stenosis.  Continue folate supplementation. # HTN: follow up with pcp for blood pressure control.  Follow up in 8 weeks with repeat labs/possible procrit   Earlie Server, MD, PhD Hematology Oncology [facility] at [facility] Pager- [phone] 01/27/18

## 2018-01-27 ENCOUNTER — Inpatient Hospital Stay: Payer: Medicare HMO

## 2018-01-27 ENCOUNTER — Encounter: Payer: Self-pay | Admitting: Oncology

## 2018-01-27 ENCOUNTER — Inpatient Hospital Stay: Payer: Medicare HMO | Admitting: Oncology

## 2018-01-27 ENCOUNTER — Inpatient Hospital Stay: Payer: Medicare HMO | Attending: Oncology

## 2018-01-27 VITALS — BP 172/64 | HR 66 | Temp 97.7°F | Resp 18 | Ht 71.0 in | Wt 154.1 lb

## 2018-01-27 DIAGNOSIS — D509 Iron deficiency anemia, unspecified: Secondary | ICD-10-CM

## 2018-01-27 DIAGNOSIS — E785 Hyperlipidemia, unspecified: Secondary | ICD-10-CM

## 2018-01-27 DIAGNOSIS — R011 Cardiac murmur, unspecified: Secondary | ICD-10-CM | POA: Insufficient documentation

## 2018-01-27 DIAGNOSIS — I2510 Atherosclerotic heart disease of native coronary artery without angina pectoris: Secondary | ICD-10-CM | POA: Diagnosis not present

## 2018-01-27 DIAGNOSIS — I129 Hypertensive chronic kidney disease with stage 1 through stage 4 chronic kidney disease, or unspecified chronic kidney disease: Secondary | ICD-10-CM | POA: Insufficient documentation

## 2018-01-27 DIAGNOSIS — Z87442 Personal history of urinary calculi: Secondary | ICD-10-CM

## 2018-01-27 DIAGNOSIS — K449 Diaphragmatic hernia without obstruction or gangrene: Secondary | ICD-10-CM | POA: Diagnosis not present

## 2018-01-27 DIAGNOSIS — M7989 Other specified soft tissue disorders: Secondary | ICD-10-CM

## 2018-01-27 DIAGNOSIS — E119 Type 2 diabetes mellitus without complications: Secondary | ICD-10-CM | POA: Diagnosis not present

## 2018-01-27 DIAGNOSIS — K219 Gastro-esophageal reflux disease without esophagitis: Secondary | ICD-10-CM | POA: Insufficient documentation

## 2018-01-27 DIAGNOSIS — Z7982 Long term (current) use of aspirin: Secondary | ICD-10-CM

## 2018-01-27 DIAGNOSIS — N184 Chronic kidney disease, stage 4 (severe): Secondary | ICD-10-CM

## 2018-01-27 DIAGNOSIS — D508 Other iron deficiency anemias: Secondary | ICD-10-CM

## 2018-01-27 DIAGNOSIS — R5383 Other fatigue: Secondary | ICD-10-CM | POA: Insufficient documentation

## 2018-01-27 DIAGNOSIS — Z7984 Long term (current) use of oral hypoglycemic drugs: Secondary | ICD-10-CM | POA: Diagnosis not present

## 2018-01-27 DIAGNOSIS — Z79899 Other long term (current) drug therapy: Secondary | ICD-10-CM | POA: Insufficient documentation

## 2018-01-27 DIAGNOSIS — D631 Anemia in chronic kidney disease: Secondary | ICD-10-CM | POA: Insufficient documentation

## 2018-01-27 DIAGNOSIS — D519 Vitamin B12 deficiency anemia, unspecified: Secondary | ICD-10-CM

## 2018-01-27 DIAGNOSIS — K4090 Unilateral inguinal hernia, without obstruction or gangrene, not specified as recurrent: Secondary | ICD-10-CM

## 2018-01-27 DIAGNOSIS — Z8042 Family history of malignant neoplasm of prostate: Secondary | ICD-10-CM

## 2018-01-27 DIAGNOSIS — Z87891 Personal history of nicotine dependence: Secondary | ICD-10-CM

## 2018-01-27 LAB — CBC WITH DIFFERENTIAL/PLATELET
BASOS PCT: 1 %
Basophils Absolute: 0 10*3/uL (ref 0–0.1)
EOS ABS: 0.3 10*3/uL (ref 0–0.7)
EOS PCT: 6 %
HCT: 31.9 % — ABNORMAL LOW (ref 40.0–52.0)
Hemoglobin: 10.6 g/dL — ABNORMAL LOW (ref 13.0–18.0)
Lymphocytes Relative: 25 %
Lymphs Abs: 1.3 10*3/uL (ref 1.0–3.6)
MCH: 28.2 pg (ref 26.0–34.0)
MCHC: 33.2 g/dL (ref 32.0–36.0)
MCV: 84.9 fL (ref 80.0–100.0)
MONO ABS: 0.5 10*3/uL (ref 0.2–1.0)
Monocytes Relative: 10 %
Neutro Abs: 3.1 10*3/uL (ref 1.4–6.5)
Neutrophils Relative %: 58 %
PLATELETS: 164 10*3/uL (ref 150–440)
RBC: 3.75 MIL/uL — ABNORMAL LOW (ref 4.40–5.90)
RDW: 14.4 % (ref 11.5–14.5)
WBC: 5.3 10*3/uL (ref 3.8–10.6)

## 2018-01-27 MED ORDER — FOLIC ACID 1 MG PO TABS: 1.0000 mg | ORAL_TABLET | Freq: Every day | ORAL | 3 refills | Status: DC

## 2018-01-27 NOTE — Progress Notes (Signed)
Energy is good, his stools are regular now and not black most days. No blood seen in it

## 2018-03-24 ENCOUNTER — Inpatient Hospital Stay: Payer: Medicare HMO | Attending: Oncology

## 2018-03-24 DIAGNOSIS — N184 Chronic kidney disease, stage 4 (severe): Secondary | ICD-10-CM | POA: Insufficient documentation

## 2018-03-24 DIAGNOSIS — I2510 Atherosclerotic heart disease of native coronary artery without angina pectoris: Secondary | ICD-10-CM | POA: Diagnosis not present

## 2018-03-24 DIAGNOSIS — K219 Gastro-esophageal reflux disease without esophagitis: Secondary | ICD-10-CM | POA: Diagnosis not present

## 2018-03-24 DIAGNOSIS — Z8042 Family history of malignant neoplasm of prostate: Secondary | ICD-10-CM | POA: Diagnosis not present

## 2018-03-24 DIAGNOSIS — I129 Hypertensive chronic kidney disease with stage 1 through stage 4 chronic kidney disease, or unspecified chronic kidney disease: Secondary | ICD-10-CM | POA: Diagnosis not present

## 2018-03-24 DIAGNOSIS — R011 Cardiac murmur, unspecified: Secondary | ICD-10-CM | POA: Insufficient documentation

## 2018-03-24 DIAGNOSIS — K4090 Unilateral inguinal hernia, without obstruction or gangrene, not specified as recurrent: Secondary | ICD-10-CM | POA: Diagnosis not present

## 2018-03-24 DIAGNOSIS — K449 Diaphragmatic hernia without obstruction or gangrene: Secondary | ICD-10-CM | POA: Insufficient documentation

## 2018-03-24 DIAGNOSIS — Z79899 Other long term (current) drug therapy: Secondary | ICD-10-CM | POA: Diagnosis not present

## 2018-03-24 DIAGNOSIS — D631 Anemia in chronic kidney disease: Secondary | ICD-10-CM | POA: Diagnosis not present

## 2018-03-24 DIAGNOSIS — D509 Iron deficiency anemia, unspecified: Secondary | ICD-10-CM | POA: Diagnosis not present

## 2018-03-24 DIAGNOSIS — Z87891 Personal history of nicotine dependence: Secondary | ICD-10-CM | POA: Diagnosis not present

## 2018-03-24 DIAGNOSIS — E785 Hyperlipidemia, unspecified: Secondary | ICD-10-CM | POA: Insufficient documentation

## 2018-03-24 DIAGNOSIS — M7989 Other specified soft tissue disorders: Secondary | ICD-10-CM | POA: Insufficient documentation

## 2018-03-24 DIAGNOSIS — Z87442 Personal history of urinary calculi: Secondary | ICD-10-CM | POA: Diagnosis not present

## 2018-03-24 DIAGNOSIS — R531 Weakness: Secondary | ICD-10-CM | POA: Diagnosis not present

## 2018-03-24 DIAGNOSIS — D519 Vitamin B12 deficiency anemia, unspecified: Secondary | ICD-10-CM

## 2018-03-24 DIAGNOSIS — E538 Deficiency of other specified B group vitamins: Secondary | ICD-10-CM | POA: Diagnosis not present

## 2018-03-24 DIAGNOSIS — Z7982 Long term (current) use of aspirin: Secondary | ICD-10-CM | POA: Diagnosis not present

## 2018-03-24 DIAGNOSIS — Z7984 Long term (current) use of oral hypoglycemic drugs: Secondary | ICD-10-CM | POA: Insufficient documentation

## 2018-03-24 LAB — CBC WITH DIFFERENTIAL/PLATELET
BASOS ABS: 0 10*3/uL (ref 0–0.1)
Basophils Relative: 1 %
Eosinophils Absolute: 0.3 10*3/uL (ref 0–0.7)
Eosinophils Relative: 5 %
HCT: 31.3 % — ABNORMAL LOW (ref 40.0–52.0)
Hemoglobin: 10.6 g/dL — ABNORMAL LOW (ref 13.0–18.0)
Lymphocytes Relative: 27 %
Lymphs Abs: 1.5 10*3/uL (ref 1.0–3.6)
MCH: 28.5 pg (ref 26.0–34.0)
MCHC: 33.9 g/dL (ref 32.0–36.0)
MCV: 84.1 fL (ref 80.0–100.0)
MONO ABS: 0.7 10*3/uL (ref 0.2–1.0)
Monocytes Relative: 12 %
Neutro Abs: 3.2 10*3/uL (ref 1.4–6.5)
Neutrophils Relative %: 55 %
Platelets: 165 10*3/uL (ref 150–440)
RBC: 3.72 MIL/uL — AB (ref 4.40–5.90)
RDW: 13.7 % (ref 11.5–14.5)
WBC: 5.7 10*3/uL (ref 3.8–10.6)

## 2018-03-27 ENCOUNTER — Encounter: Payer: Self-pay | Admitting: Oncology

## 2018-03-27 ENCOUNTER — Other Ambulatory Visit: Payer: Self-pay

## 2018-03-27 ENCOUNTER — Other Ambulatory Visit: Payer: Medicare HMO

## 2018-03-27 ENCOUNTER — Inpatient Hospital Stay: Payer: Medicare HMO

## 2018-03-27 ENCOUNTER — Inpatient Hospital Stay: Payer: Medicare HMO | Admitting: Oncology

## 2018-03-27 VITALS — BP 154/59 | HR 67 | Temp 98.3°F | Resp 16 | Ht 71.0 in | Wt 155.2 lb

## 2018-03-27 DIAGNOSIS — K219 Gastro-esophageal reflux disease without esophagitis: Secondary | ICD-10-CM | POA: Diagnosis not present

## 2018-03-27 DIAGNOSIS — E785 Hyperlipidemia, unspecified: Secondary | ICD-10-CM

## 2018-03-27 DIAGNOSIS — D631 Anemia in chronic kidney disease: Secondary | ICD-10-CM | POA: Diagnosis not present

## 2018-03-27 DIAGNOSIS — Z7982 Long term (current) use of aspirin: Secondary | ICD-10-CM

## 2018-03-27 DIAGNOSIS — K449 Diaphragmatic hernia without obstruction or gangrene: Secondary | ICD-10-CM | POA: Diagnosis not present

## 2018-03-27 DIAGNOSIS — N184 Chronic kidney disease, stage 4 (severe): Secondary | ICD-10-CM

## 2018-03-27 DIAGNOSIS — R011 Cardiac murmur, unspecified: Secondary | ICD-10-CM

## 2018-03-27 DIAGNOSIS — Z79899 Other long term (current) drug therapy: Secondary | ICD-10-CM

## 2018-03-27 DIAGNOSIS — I2510 Atherosclerotic heart disease of native coronary artery without angina pectoris: Secondary | ICD-10-CM

## 2018-03-27 DIAGNOSIS — R531 Weakness: Secondary | ICD-10-CM | POA: Diagnosis not present

## 2018-03-27 DIAGNOSIS — Z7984 Long term (current) use of oral hypoglycemic drugs: Secondary | ICD-10-CM

## 2018-03-27 DIAGNOSIS — Z87891 Personal history of nicotine dependence: Secondary | ICD-10-CM

## 2018-03-27 DIAGNOSIS — M7989 Other specified soft tissue disorders: Secondary | ICD-10-CM

## 2018-03-27 DIAGNOSIS — Z87442 Personal history of urinary calculi: Secondary | ICD-10-CM

## 2018-03-27 DIAGNOSIS — I129 Hypertensive chronic kidney disease with stage 1 through stage 4 chronic kidney disease, or unspecified chronic kidney disease: Secondary | ICD-10-CM | POA: Diagnosis not present

## 2018-03-27 DIAGNOSIS — Z8042 Family history of malignant neoplasm of prostate: Secondary | ICD-10-CM

## 2018-03-27 DIAGNOSIS — K4090 Unilateral inguinal hernia, without obstruction or gangrene, not specified as recurrent: Secondary | ICD-10-CM | POA: Diagnosis not present

## 2018-03-27 DIAGNOSIS — D509 Iron deficiency anemia, unspecified: Secondary | ICD-10-CM

## 2018-03-27 DIAGNOSIS — E538 Deficiency of other specified B group vitamins: Secondary | ICD-10-CM

## 2018-03-27 MED ORDER — CYANOCOBALAMIN 500 MCG PO TABS: 500.0000 ug | ORAL_TABLET | Freq: Every day | ORAL | 3 refills | Status: DC

## 2018-03-27 NOTE — Progress Notes (Signed)
[facility] Follow Up visit  Patient Care Team: [name], MD as PCP - General (Internal Medicine)  REASON FOR VISIT Follow up for treatment of anemia due to CKD.   HISTORY OF PRESENTING ILLNESS: Jay Mathis 84 y.o. male with PMH listed as below who presents for follow up of his treatment of anemia.  Pertinent hematology history includes: Patient has recently established care with nephrologist Dr.Kulluru for CKD and was referred to [place] for anemia work up and treatment. Patient at that time reports feeling fatigue, weakness. Noted black stool. He has inguinal hernia for which he has seen surgery and was not recommended for intervention.  He was found to be functional iron deficiency and he has received 2 doses of Feraheme, tolerated well and four weekly dose of 1056mcg B12 shot.  Procrit was started on 08/15/2017, he received 40,000 units.  09/12/2017 Procrit 20,000 units # s/p 2 does of Feraheme and 4 weekly dose of Vitamin B12 shots. . Iron store is fully replete.  Interval History Today patient feels well today. Denies fatigue, and appetite is  good.  Denies any shortness of breath, chest pain, abdominal pain. Chronic lower extremity swelling.   Review of Systems  Constitutional: Negative for appetite change, chills, diaphoresis, fatigue and fever.  HENT:   Negative for mouth sores and sore throat.   Eyes: Negative for eye problems.  Respiratory: Negative for chest tightness, cough and hemoptysis.   Cardiovascular: Positive for leg swelling. Negative for chest pain and palpitations.  Gastrointestinal: Negative for abdominal distention, abdominal pain, blood in stool and constipation.  Endocrine: Negative for hot flashes.  Genitourinary: Negative for difficulty urinating, dysuria, frequency and hematuria.   Musculoskeletal: Negative for arthralgias, back pain and flank pain.  Skin: Negative for rash and wound.  Neurological: Negative for dizziness and  headaches.  Hematological: Negative for adenopathy.  Psychiatric/Behavioral: Negative for confusion and decreased concentration. The patient is not nervous/anxious.     MEDICAL HISTORY: Past Medical History:  Diagnosis Date  . CAD (coronary artery disease)   . CKD (chronic kidney disease) stage 4, GFR 15-29 ml/min (HCC)   . Diabetes mellitus without complication ([place])   . GERD (gastroesophageal reflux disease)   . Hyperlipidemia   . Hypertension   . Iron deficiency anemia 07/30/2017  . Nephrolithiasis     SURGICAL HISTORY: Past Surgical History:  Procedure Laterality Date  . APPENDECTOMY     60 plus years ago  . CORONARY ANGIOPLASTY WITH STENT PLACEMENT    . CORONARY ARTERY BYPASS GRAFT      SOCIAL HISTORY: Social History   Socioeconomic History  . Marital status: Married    Spouse name: Not on file  . Number of children: Not on file  . Years of education: Not on file  . Highest education level: Not on file  Occupational History  . Not on file  Social Needs  . Financial resource strain: Not on file  . Food insecurity:    Worry: Not on file    Inability: Not on file  . Transportation needs:    Medical: Not on file    Non-medical: Not on file  Tobacco Use  . Smoking status: Former Research scientist (life sciences)  . Smokeless tobacco: Never Used  . Tobacco comment: quit about 60 years ago 2019 dated  Substance and Sexual Activity  . Alcohol use: No  . Drug use: No  . Sexual activity: Not on file  Lifestyle  . Physical activity:    Days  per week: Not on file    Minutes per session: Not on file  . Stress: Not on file  Relationships  . Social connections:    Talks on phone: Not on file    Gets together: Not on file    Attends religious service: Not on file    Active member of club or organization: Not on file    Attends meetings of clubs or organizations: Not on file    Relationship status: Not on file  . Intimate partner violence:    Fear of current or ex partner: Not on file     Emotionally abused: Not on file    Physically abused: Not on file    Forced sexual activity: Not on file  Other Topics Concern  . Not on file  Social History Narrative  . Not on file    FAMILY HISTORY Family History  Problem Relation Age of Onset  . Prostate cancer Father   . Prostate cancer Brother     ALLERGIES:  is allergic to sulfa antibiotics.  MEDICATIONS:  Current Outpatient Medications  Medication Sig Dispense Refill  . amLODipine (NORVASC) 10 MG tablet Take 10 mg by mouth daily.    Marland Kitchen aspirin EC 81 MG tablet Take 81 mg by mouth daily.    Marland Kitchen atorvastatin (LIPITOR) 10 MG tablet Take 10 mg by mouth at bedtime.     . carvedilol (COREG) 3.125 MG tablet Take 3.125 mg 2 (two) times daily by mouth.    . ferrous sulfate 325 (65 FE) MG tablet Take 325 mg by mouth 2 (two) times daily with a meal.     . folic acid (FOLVITE) 1 MG tablet Take 1 tablet (1 mg total) by mouth daily. 30 tablet 3  . furosemide (LASIX) 20 MG tablet Take 20 mg by mouth daily.    Marland Kitchen glimepiride (AMARYL) 2 MG tablet Take 2 mg by mouth every evening.     . pantoprazole (PROTONIX) 40 MG tablet Take 40 mg by mouth 2 (two) times daily.    . sodium bicarbonate 650 MG tablet Take 650 mg by mouth 2 (two) times daily.    . tamsulosin (FLOMAX) 0.4 MG CAPS capsule Take 1 capsule by mouth daily.    . vitamin B-12 (CYANOCOBALAMIN) 500 MCG tablet Take 1 tablet (500 mcg total) by mouth daily. 90 tablet 3  . Vitamin D, Ergocalciferol, (DRISDOL) 50000 units CAPS capsule Take 50,000 Units by mouth every 7 (seven) days.     No current facility-administered medications for this visit.     PHYSICAL EXAMINATION:  ECOG PERFORMANCE STATUS: 0 - Asymptomatic   Vitals:   03/27/18 1621  BP: (!) 154/59  Pulse: 67  Resp: 16  Temp: 98.3 F (36.8 C)    Filed Weights   03/27/18 1621  Weight: 155 lb 3 oz (70.4 kg)     Physical Exam  GENERAL: No distress, well nourished.  SKIN:  No rashes or significant lesions  HEAD:  Normocephalic, No masses, lesions, tenderness or abnormalities  EYES: Conjunctiva are pink, non icteric ENT: External ears normal ,lips , buccal mucosa, and tongue normal and mucous membranes are moist  LYMPH: No palpable cervical and axillary lymphadenopathy  LUNGS: Clear to auscultation, no crackles or wheezes HEART: Regular rate & rhythm, no murmurs, no gallops, S1 normal and S2 normal . ABDOMEN: Abdomen soft, non-tender, normal bowel sounds, I did not appreciate any  masses or organomegaly  MUSCULOSKELETAL: No CVA tenderness and no tenderness on percussion of the  back or rib cage.  EXTREMITIES:  +1 LE edema. , no skin discoloration or tenderness NEURO: Alert & oriented, no focal motor/sensory deficits.   LABORATORY DATA: I have personally reviewed the data as listed:  Appointment on 03/24/2018  Component Date Value Ref Range Status  . WBC 03/24/2018 5.7  3.8 - 10.6 K/uL Final  . RBC 03/24/2018 3.72* 4.40 - 5.90 MIL/uL Final  . Hemoglobin 03/24/2018 10.6* 13.0 - 18.0 g/dL Final  . HCT 03/24/2018 31.3* 40.0 - 52.0 % Final  . MCV 03/24/2018 84.1  80.0 - 100.0 fL Final  . MCH 03/24/2018 28.5  26.0 - 34.0 pg Final  . MCHC 03/24/2018 33.9  32.0 - 36.0 g/dL Final  . RDW 03/24/2018 13.7  11.5 - 14.5 % Final  . Platelets 03/24/2018 165  150 - 440 K/uL Final  . Neutrophils Relative % 03/24/2018 55  % Final  . Neutro Abs 03/24/2018 3.2  1.4 - 6.5 K/uL Final  . Lymphocytes Relative 03/24/2018 27  % Final  . Lymphs Abs 03/24/2018 1.5  1.0 - 3.6 K/uL Final  . Monocytes Relative 03/24/2018 12  % Final  . Monocytes Absolute 03/24/2018 0.7  0.2 - 1.0 K/uL Final  . Eosinophils Relative 03/24/2018 5  % Final  . Eosinophils Absolute 03/24/2018 0.3  0 - 0.7 K/uL Final  . Basophils Relative 03/24/2018 1  % Final  . Basophils Absolute 03/24/2018 0.0  0 - 0.1 K/uL Final   Performed at [facility], [address], [place], [place] [address]   [facility] results.  07/08/2017 Iron 81, TIBC 298,  Saturation 27%, Ferritin 57 Iron/TIBC/Ferritin/ %Sat    Component Value Date/Time   IRON 52 08/13/2017 1049   TIBC 259 08/13/2017 1049   FERRITIN 492 (H) 08/13/2017 1049   IRONPCTSAT 20 08/13/2017 1049   Tested negative for anti parietal antibody or intrinsic factor antibody. He is tested negative for occult blood in stool.   RADIOGRAPHIC STUDIES: I have personally reviewed the radiological images as listed and agree with the findings in the report 06/10/2017 CT abdomen pelvis IMPRESSION: Wide neck small bowel containing indirect right inguinal hernia. Mild stranding of the herniated mesenteric without definite evidence of incarceration. Atrophic appearance of the kidneys. Multiple urinary bladder diverticulae.  ASSESSMENT/PLAN  1. Anemia in stage 4 chronic kidney disease ([place])   2. CKD (chronic kidney disease) stage 4, GFR 15-29 ml/min (HCC)   3. Iron deficiency anemia, unspecified iron deficiency anemia type   4. Systolic murmur    # Hb is 10.6, above 10, will continue to hold Procrit. Continue monitor.  Discussed with patient that since he is hemoglobin has been stable for the past few months, can further space out his follow up with me to every 3 month. with repeat labs and possible Procrit.  # Systolic Heart murmur: likely aortic stenosis.  Continue folic acid supplementation. # History of B12 deficiency, B12 normalized. Continue oral Vitmain B12 535mcg daily, Rx sent to pharmacy.    Follow up in 3 months  with repeat labs/possible procrit   Earlie Server, MD, PhD Hematology Oncology [facility] at [facility] Pager- [phone] 03/27/18

## 2018-06-11 DIAGNOSIS — D631 Anemia in chronic kidney disease: Secondary | ICD-10-CM | POA: Diagnosis not present

## 2018-06-11 DIAGNOSIS — E1122 Type 2 diabetes mellitus with diabetic chronic kidney disease: Secondary | ICD-10-CM | POA: Diagnosis not present

## 2018-06-11 DIAGNOSIS — E872 Acidosis: Secondary | ICD-10-CM | POA: Diagnosis not present

## 2018-06-11 DIAGNOSIS — N184 Chronic kidney disease, stage 4 (severe): Secondary | ICD-10-CM | POA: Diagnosis not present

## 2018-06-11 DIAGNOSIS — I129 Hypertensive chronic kidney disease with stage 1 through stage 4 chronic kidney disease, or unspecified chronic kidney disease: Secondary | ICD-10-CM | POA: Diagnosis not present

## 2018-06-11 DIAGNOSIS — R809 Proteinuria, unspecified: Secondary | ICD-10-CM | POA: Diagnosis not present

## 2018-06-25 ENCOUNTER — Inpatient Hospital Stay: Payer: Medicare HMO | Attending: Oncology

## 2018-06-25 DIAGNOSIS — D509 Iron deficiency anemia, unspecified: Secondary | ICD-10-CM | POA: Insufficient documentation

## 2018-06-25 DIAGNOSIS — K219 Gastro-esophageal reflux disease without esophagitis: Secondary | ICD-10-CM | POA: Insufficient documentation

## 2018-06-25 DIAGNOSIS — Z87891 Personal history of nicotine dependence: Secondary | ICD-10-CM | POA: Insufficient documentation

## 2018-06-25 DIAGNOSIS — Z8042 Family history of malignant neoplasm of prostate: Secondary | ICD-10-CM | POA: Insufficient documentation

## 2018-06-25 DIAGNOSIS — D631 Anemia in chronic kidney disease: Secondary | ICD-10-CM | POA: Insufficient documentation

## 2018-06-25 DIAGNOSIS — N184 Chronic kidney disease, stage 4 (severe): Secondary | ICD-10-CM | POA: Insufficient documentation

## 2018-06-25 DIAGNOSIS — I2510 Atherosclerotic heart disease of native coronary artery without angina pectoris: Secondary | ICD-10-CM | POA: Insufficient documentation

## 2018-06-25 DIAGNOSIS — Z79899 Other long term (current) drug therapy: Secondary | ICD-10-CM | POA: Insufficient documentation

## 2018-06-25 DIAGNOSIS — K4090 Unilateral inguinal hernia, without obstruction or gangrene, not specified as recurrent: Secondary | ICD-10-CM | POA: Insufficient documentation

## 2018-06-25 DIAGNOSIS — R011 Cardiac murmur, unspecified: Secondary | ICD-10-CM | POA: Insufficient documentation

## 2018-06-25 DIAGNOSIS — I129 Hypertensive chronic kidney disease with stage 1 through stage 4 chronic kidney disease, or unspecified chronic kidney disease: Secondary | ICD-10-CM | POA: Insufficient documentation

## 2018-06-25 DIAGNOSIS — E785 Hyperlipidemia, unspecified: Secondary | ICD-10-CM | POA: Insufficient documentation

## 2018-06-25 DIAGNOSIS — E119 Type 2 diabetes mellitus without complications: Secondary | ICD-10-CM | POA: Insufficient documentation

## 2018-06-27 ENCOUNTER — Other Ambulatory Visit: Payer: Self-pay

## 2018-06-27 ENCOUNTER — Inpatient Hospital Stay: Payer: Medicare HMO

## 2018-06-27 ENCOUNTER — Encounter: Payer: Self-pay | Admitting: Oncology

## 2018-06-27 ENCOUNTER — Inpatient Hospital Stay: Payer: Medicare HMO | Admitting: Oncology

## 2018-06-27 VITALS — BP 151/60 | HR 57 | Temp 96.0°F | Wt 153.1 lb

## 2018-06-27 DIAGNOSIS — I2510 Atherosclerotic heart disease of native coronary artery without angina pectoris: Secondary | ICD-10-CM | POA: Diagnosis not present

## 2018-06-27 DIAGNOSIS — D631 Anemia in chronic kidney disease: Secondary | ICD-10-CM

## 2018-06-27 DIAGNOSIS — R011 Cardiac murmur, unspecified: Secondary | ICD-10-CM | POA: Diagnosis not present

## 2018-06-27 DIAGNOSIS — E785 Hyperlipidemia, unspecified: Secondary | ICD-10-CM | POA: Diagnosis not present

## 2018-06-27 DIAGNOSIS — D509 Iron deficiency anemia, unspecified: Secondary | ICD-10-CM

## 2018-06-27 DIAGNOSIS — Z79899 Other long term (current) drug therapy: Secondary | ICD-10-CM

## 2018-06-27 DIAGNOSIS — E119 Type 2 diabetes mellitus without complications: Secondary | ICD-10-CM | POA: Diagnosis not present

## 2018-06-27 DIAGNOSIS — K4090 Unilateral inguinal hernia, without obstruction or gangrene, not specified as recurrent: Secondary | ICD-10-CM

## 2018-06-27 DIAGNOSIS — I129 Hypertensive chronic kidney disease with stage 1 through stage 4 chronic kidney disease, or unspecified chronic kidney disease: Secondary | ICD-10-CM

## 2018-06-27 DIAGNOSIS — Z87891 Personal history of nicotine dependence: Secondary | ICD-10-CM | POA: Diagnosis not present

## 2018-06-27 DIAGNOSIS — Z8042 Family history of malignant neoplasm of prostate: Secondary | ICD-10-CM | POA: Diagnosis not present

## 2018-06-27 DIAGNOSIS — K219 Gastro-esophageal reflux disease without esophagitis: Secondary | ICD-10-CM | POA: Diagnosis not present

## 2018-06-27 DIAGNOSIS — N184 Chronic kidney disease, stage 4 (severe): Secondary | ICD-10-CM | POA: Diagnosis not present

## 2018-06-27 LAB — CBC WITH DIFFERENTIAL/PLATELET
Basophils Absolute: 0 10*3/uL (ref 0–0.1)
Basophils Relative: 1 %
EOS ABS: 0.3 10*3/uL (ref 0–0.7)
Eosinophils Relative: 5 %
HCT: 27.8 % — ABNORMAL LOW (ref 40.0–52.0)
Hemoglobin: 9.2 g/dL — ABNORMAL LOW (ref 13.0–18.0)
LYMPHS ABS: 1.3 10*3/uL (ref 1.0–3.6)
Lymphocytes Relative: 24 %
MCH: 28.2 pg (ref 26.0–34.0)
MCHC: 33.2 g/dL (ref 32.0–36.0)
MCV: 85 fL (ref 80.0–100.0)
MONO ABS: 0.5 10*3/uL (ref 0.2–1.0)
MONOS PCT: 9 %
Neutro Abs: 3.3 10*3/uL (ref 1.4–6.5)
Neutrophils Relative %: 61 %
Platelets: 171 10*3/uL (ref 150–440)
RBC: 3.27 MIL/uL — ABNORMAL LOW (ref 4.40–5.90)
RDW: 15 % — AB (ref 11.5–14.5)
WBC: 5.5 10*3/uL (ref 3.8–10.6)

## 2018-06-27 LAB — VITAMIN B12: VITAMIN B 12: 1052 pg/mL — AB (ref 180–914)

## 2018-06-27 MED ORDER — FOLIC ACID 1 MG PO TABS: 1.0000 mg | ORAL_TABLET | Freq: Every day | ORAL | 5 refills | Status: DC

## 2018-06-27 MED ORDER — EPOETIN ALFA 20000 UNIT/ML IJ SOLN
20000.0000 [IU] | Freq: Once | INTRAMUSCULAR | Status: AC
  Administered 2018-06-27: 20000 [IU] via SUBCUTANEOUS
  Filled 2018-06-27: qty 1

## 2018-06-27 MED ORDER — CYANOCOBALAMIN 500 MCG PO TABS: 500.0000 ug | ORAL_TABLET | Freq: Every day | ORAL | 3 refills | Status: DC

## 2018-06-27 NOTE — Progress Notes (Signed)
Patient here today for follow up.  Patient states no new concerns today  

## 2018-06-27 NOTE — Progress Notes (Signed)
[facility] Follow Up visit  Patient Care Team: [name], MD as PCP - General (Internal Medicine)  REASON FOR VISIT Follow up for treatment of anemia due to CKD.   HISTORY OF PRESENTING ILLNESS: Jay Mathis 84 y.o. male with PMH listed as below who presents for follow up of his treatment of anemia.  Pertinent hematology history includes: Patient has recently established care with nephrologist Dr.Kulluru for CKD and was referred to [place] for anemia work up and treatment. Patient at that time reports feeling fatigue, weakness. Noted black stool. He has inguinal hernia for which he has seen surgery and was not recommended for intervention.  He was found to be functional iron deficiency and he has received 2 doses of Feraheme, tolerated well and four weekly dose of 1018mcg B12 shot.  Procrit was started on 08/15/2017, he received 40,000 units.  09/12/2017 Procrit 20,000 units # s/p 2 does of Feraheme and 4 weekly dose of Vitamin B12 shots. . Iron store is fully replete.  Interval History Patient presents for the management of anemia of chronic kidney disease. Previously he has received Procrit injection for 2 times.  Hemoglobin able to be holding well above 10. Patient reports feeling well, denies fatigue, shortness of breath.  Appetite is good. He has chronic lower extremity swelling which is at baseline.  Not better or worse.    Review of Systems  Constitutional: Negative for appetite change, chills, diaphoresis, fatigue, fever and unexpected weight change.  HENT:   Negative for hearing loss, lump/mass, mouth sores, nosebleeds and sore throat.   Eyes: Negative for eye problems and icterus.  Respiratory: Negative for chest tightness, cough, hemoptysis, shortness of breath and wheezing.   Cardiovascular: Positive for leg swelling. Negative for chest pain and palpitations.  Gastrointestinal: Negative for abdominal distention, abdominal pain, blood in stool,  constipation, diarrhea, nausea and rectal pain.  Endocrine: Negative for hot flashes.  Genitourinary: Negative for bladder incontinence, difficulty urinating, dysuria, frequency, hematuria and nocturia.   Musculoskeletal: Negative for arthralgias, back pain, flank pain, gait problem and myalgias.  Skin: Negative for rash and wound.  Neurological: Negative for dizziness, gait problem, headaches, numbness and seizures.  Hematological: Negative for adenopathy. Does not bruise/bleed easily.  Psychiatric/Behavioral: Negative for confusion and decreased concentration. The patient is not nervous/anxious.     MEDICAL HISTORY: Past Medical History:  Diagnosis Date  . CAD (coronary artery disease)   . CKD (chronic kidney disease) stage 4, GFR 15-29 ml/min (HCC)   . Diabetes mellitus without complication ([place])   . GERD (gastroesophageal reflux disease)   . Hyperlipidemia   . Hypertension   . Iron deficiency anemia 07/30/2017  . Nephrolithiasis     SURGICAL HISTORY: Past Surgical History:  Procedure Laterality Date  . APPENDECTOMY     60 plus years ago  . CORONARY ANGIOPLASTY WITH STENT PLACEMENT    . CORONARY ARTERY BYPASS GRAFT      SOCIAL HISTORY: Social History   Socioeconomic History  . Marital status: Married    Spouse name: Not on file  . Number of children: Not on file  . Years of education: Not on file  . Highest education level: Not on file  Occupational History  . Not on file  Social Needs  . Financial resource strain: Not on file  . Food insecurity:    Worry: Not on file    Inability: Not on file  . Transportation needs:    Medical: Not on file  Non-medical: Not on file  Tobacco Use  . Smoking status: Former Research scientist (life sciences)  . Smokeless tobacco: Never Used  . Tobacco comment: quit about 60 years ago 2019 dated  Substance and Sexual Activity  . Alcohol use: No  . Drug use: No  . Sexual activity: Not on file  Lifestyle  . Physical activity:    Days per week: Not on  file    Minutes per session: Not on file  . Stress: Not on file  Relationships  . Social connections:    Talks on phone: Not on file    Gets together: Not on file    Attends religious service: Not on file    Active member of club or organization: Not on file    Attends meetings of clubs or organizations: Not on file    Relationship status: Not on file  . Intimate partner violence:    Fear of current or ex partner: Not on file    Emotionally abused: Not on file    Physically abused: Not on file    Forced sexual activity: Not on file  Other Topics Concern  . Not on file  Social History Narrative  . Not on file    FAMILY HISTORY Family History  Problem Relation Age of Onset  . Prostate cancer Father   . Prostate cancer Brother     ALLERGIES:  is allergic to sulfa antibiotics.  MEDICATIONS:  Current Outpatient Medications  Medication Sig Dispense Refill  . amLODipine (NORVASC) 10 MG tablet Take 10 mg by mouth daily.    Marland Kitchen aspirin EC 81 MG tablet Take 81 mg by mouth daily.    Marland Kitchen atorvastatin (LIPITOR) 10 MG tablet Take 10 mg by mouth at bedtime.     . carvedilol (COREG) 3.125 MG tablet Take 3.125 mg 2 (two) times daily by mouth.    . ferrous sulfate 325 (65 FE) MG tablet Take 325 mg by mouth 2 (two) times daily with a meal.     . folic acid (FOLVITE) 1 MG tablet Take 1 tablet (1 mg total) by mouth daily. 30 tablet 3  . furosemide (LASIX) 20 MG tablet Take 20 mg by mouth daily.    Marland Kitchen glimepiride (AMARYL) 2 MG tablet Take 2 mg by mouth every evening.     . pantoprazole (PROTONIX) 40 MG tablet Take 40 mg by mouth 2 (two) times daily.    . sodium bicarbonate 650 MG tablet Take 650 mg by mouth 2 (two) times daily.    . tamsulosin (FLOMAX) 0.4 MG CAPS capsule Take 1 capsule by mouth daily.    . vitamin B-12 (CYANOCOBALAMIN) 500 MCG tablet Take 1 tablet (500 mcg total) by mouth daily. 90 tablet 3  . Vitamin D, Ergocalciferol, (DRISDOL) 50000 units CAPS capsule Take 50,000 Units by  mouth every 7 (seven) days.     No current facility-administered medications for this visit.     PHYSICAL EXAMINATION:  ECOG PERFORMANCE STATUS: 0 - Asymptomatic   Vitals:   06/27/18 1108  BP: (!) 151/60  Pulse: (!) 57  Temp: (!) 96 F (35.6 C)    Filed Weights   06/27/18 1108  Weight: 153 lb 2 oz (69.5 kg)     Physical Exam  Constitutional: He is oriented to person, place, and time and well-developed, well-nourished, and in no distress. No distress.  HENT:  Head: Normocephalic and atraumatic.  Nose: Nose normal.  Mouth/Throat: Oropharynx is clear and moist. No oropharyngeal exudate.  Eyes: Pupils are equal,  round, and reactive to light. EOM are normal. Left eye exhibits no discharge. No scleral icterus.  Neck: Normal range of motion. Neck supple.  Cardiovascular: Normal rate and regular rhythm.  No murmur heard. Pulmonary/Chest: Effort normal. No respiratory distress. He has no rales. He exhibits no tenderness.  Abdominal: Soft. He exhibits no distension and no mass. There is no tenderness.  Musculoskeletal: Normal range of motion. He exhibits edema.  Neurological: He is alert and oriented to person, place, and time. No cranial nerve deficit. He exhibits normal muscle tone. Coordination normal.  Skin: Skin is warm and dry. He is not diaphoretic. No erythema.  Chronic lower extremity vascular insufficiency hyperpigmentation.  Psychiatric: Affect normal.      LABORATORY DATA: I have personally reviewed the data as listed:  Appointment on 06/27/2018  Component Date Value Ref Range Status  . WBC 06/27/2018 5.5  3.8 - 10.6 K/uL Final  . RBC 06/27/2018 3.27* 4.40 - 5.90 MIL/uL Final  . Hemoglobin 06/27/2018 9.2* 13.0 - 18.0 g/dL Final  . HCT 06/27/2018 27.8* 40.0 - 52.0 % Final  . MCV 06/27/2018 85.0  80.0 - 100.0 fL Final  . MCH 06/27/2018 28.2  26.0 - 34.0 pg Final  . MCHC 06/27/2018 33.2  32.0 - 36.0 g/dL Final  . RDW 06/27/2018 15.0* 11.5 - 14.5 % Final  .  Platelets 06/27/2018 171  150 - 440 K/uL Final  . Neutrophils Relative % 06/27/2018 61  % Final  . Neutro Abs 06/27/2018 3.3  1.4 - 6.5 K/uL Final  . Lymphocytes Relative 06/27/2018 24  % Final  . Lymphs Abs 06/27/2018 1.3  1.0 - 3.6 K/uL Final  . Monocytes Relative 06/27/2018 9  % Final  . Monocytes Absolute 06/27/2018 0.5  0.2 - 1.0 K/uL Final  . Eosinophils Relative 06/27/2018 5  % Final  . Eosinophils Absolute 06/27/2018 0.3  0 - 0.7 K/uL Final  . Basophils Relative 06/27/2018 1  % Final  . Basophils Absolute 06/27/2018 0.0  0 - 0.1 K/uL Final   Performed at [facility], [address], [place], [place] [address]   [facility] results.  07/08/2017 Iron 81, TIBC 298, Saturation 27%, Ferritin 57 Iron/TIBC/Ferritin/ %Sat    Component Value Date/Time   IRON 52 08/13/2017 1049   TIBC 259 08/13/2017 1049   FERRITIN 492 (H) 08/13/2017 1049   IRONPCTSAT 20 08/13/2017 1049   Tested negative for anti parietal antibody or intrinsic factor antibody. He is tested negative for occult blood in stool.   RADIOGRAPHIC STUDIES: I have personally reviewed the radiological images as listed and agree with the findings in the report 06/10/2017 CT abdomen pelvis IMPRESSION: Wide neck small bowel containing indirect right inguinal hernia. Mild stranding of the herniated mesenteric without definite evidence of incarceration. Atrophic appearance of the kidneys. Multiple urinary bladder diverticulae.  ASSESSMENT/PLAN  1. CKD (chronic kidney disease) stage 4, GFR 15-29 ml/min (HCC)   2. Iron deficiency anemia, unspecified iron deficiency anemia type   3. Anemia in stage 4 chronic kidney disease ([place])   4. Systolic murmur    #Labs reviewed and discussed with patient.  Hemoglobin 9.2.  We will proceed with Procrit 20,000 unit x 1. Recommend patient to repeat a CBC in 6 weeks. Repeat CBC CMP B12 in 12 weeks and see me for assessment for additional need of Procrit. # Systolic Heart murmur: likely  aortic stenosis.  Continue folic acid supplementation.  Refills sent.   # History of B12 deficiency, B12 levels pending.  Continue B12 oral supplementation.   Follow up in 3 months  with repeat labs/possible procrit  Orders Placed This Encounter  Procedures  . CBC with Differential/Platelet    Standing Status:   Future    Standing Expiration Date:   06/28/2019  . CBC with Differential/Platelet    Standing Status:   Future    Standing Expiration Date:   06/28/2019  . Comprehensive metabolic panel    Standing Status:   Future    Standing Expiration Date:   06/28/2019    Earlie Server, MD, PhD Hematology Oncology [facility] Pager- [phone] 06/27/18

## 2018-08-07 ENCOUNTER — Other Ambulatory Visit: Payer: Medicare HMO

## 2018-08-08 ENCOUNTER — Inpatient Hospital Stay: Payer: Medicare HMO | Attending: Oncology

## 2018-08-08 ENCOUNTER — Other Ambulatory Visit: Payer: Self-pay

## 2018-08-08 DIAGNOSIS — D509 Iron deficiency anemia, unspecified: Secondary | ICD-10-CM

## 2018-08-08 LAB — CBC WITH DIFFERENTIAL/PLATELET
Basophils Absolute: 0 10*3/uL (ref 0–0.1)
Basophils Relative: 1 %
EOS ABS: 0.4 10*3/uL (ref 0–0.7)
EOS PCT: 6 %
HCT: 30.3 % — ABNORMAL LOW (ref 40.0–52.0)
Hemoglobin: 9.9 g/dL — ABNORMAL LOW (ref 13.0–18.0)
LYMPHS ABS: 1.7 10*3/uL (ref 1.0–3.6)
LYMPHS PCT: 26 %
MCH: 27.8 pg (ref 26.0–34.0)
MCHC: 32.7 g/dL (ref 32.0–36.0)
MCV: 85.1 fL (ref 80.0–100.0)
MONO ABS: 0.7 10*3/uL (ref 0.2–1.0)
Monocytes Relative: 11 %
Neutro Abs: 3.6 10*3/uL (ref 1.4–6.5)
Neutrophils Relative %: 56 %
PLATELETS: 165 10*3/uL (ref 150–440)
RBC: 3.56 MIL/uL — ABNORMAL LOW (ref 4.40–5.90)
RDW: 14.5 % (ref 11.5–14.5)
WBC: 6.3 10*3/uL (ref 3.8–10.6)

## 2018-09-29 ENCOUNTER — Inpatient Hospital Stay (HOSPITAL_BASED_OUTPATIENT_CLINIC_OR_DEPARTMENT_OTHER): Payer: Medicare HMO | Admitting: Oncology

## 2018-09-29 ENCOUNTER — Inpatient Hospital Stay: Payer: Medicare HMO | Attending: Oncology

## 2018-09-29 ENCOUNTER — Other Ambulatory Visit: Payer: Self-pay

## 2018-09-29 ENCOUNTER — Encounter: Payer: Self-pay | Admitting: Oncology

## 2018-09-29 ENCOUNTER — Inpatient Hospital Stay: Payer: Medicare HMO

## 2018-09-29 VITALS — BP 152/65 | HR 60 | Temp 99.0°F | Resp 20 | Ht 71.0 in | Wt 149.7 lb

## 2018-09-29 DIAGNOSIS — R531 Weakness: Secondary | ICD-10-CM | POA: Diagnosis not present

## 2018-09-29 DIAGNOSIS — E538 Deficiency of other specified B group vitamins: Secondary | ICD-10-CM | POA: Insufficient documentation

## 2018-09-29 DIAGNOSIS — D509 Iron deficiency anemia, unspecified: Secondary | ICD-10-CM

## 2018-09-29 DIAGNOSIS — R5383 Other fatigue: Secondary | ICD-10-CM | POA: Insufficient documentation

## 2018-09-29 DIAGNOSIS — E785 Hyperlipidemia, unspecified: Secondary | ICD-10-CM | POA: Insufficient documentation

## 2018-09-29 DIAGNOSIS — K4090 Unilateral inguinal hernia, without obstruction or gangrene, not specified as recurrent: Secondary | ICD-10-CM | POA: Insufficient documentation

## 2018-09-29 DIAGNOSIS — Z8042 Family history of malignant neoplasm of prostate: Secondary | ICD-10-CM | POA: Insufficient documentation

## 2018-09-29 DIAGNOSIS — Z87891 Personal history of nicotine dependence: Secondary | ICD-10-CM | POA: Insufficient documentation

## 2018-09-29 DIAGNOSIS — M7989 Other specified soft tissue disorders: Secondary | ICD-10-CM

## 2018-09-29 DIAGNOSIS — R011 Cardiac murmur, unspecified: Secondary | ICD-10-CM | POA: Diagnosis not present

## 2018-09-29 DIAGNOSIS — K219 Gastro-esophageal reflux disease without esophagitis: Secondary | ICD-10-CM | POA: Insufficient documentation

## 2018-09-29 DIAGNOSIS — Z79899 Other long term (current) drug therapy: Secondary | ICD-10-CM

## 2018-09-29 DIAGNOSIS — Z7984 Long term (current) use of oral hypoglycemic drugs: Secondary | ICD-10-CM | POA: Insufficient documentation

## 2018-09-29 DIAGNOSIS — I2510 Atherosclerotic heart disease of native coronary artery without angina pectoris: Secondary | ICD-10-CM

## 2018-09-29 DIAGNOSIS — E119 Type 2 diabetes mellitus without complications: Secondary | ICD-10-CM | POA: Insufficient documentation

## 2018-09-29 DIAGNOSIS — Z87442 Personal history of urinary calculi: Secondary | ICD-10-CM | POA: Insufficient documentation

## 2018-09-29 DIAGNOSIS — N184 Chronic kidney disease, stage 4 (severe): Secondary | ICD-10-CM

## 2018-09-29 DIAGNOSIS — D631 Anemia in chronic kidney disease: Secondary | ICD-10-CM | POA: Diagnosis not present

## 2018-09-29 DIAGNOSIS — I129 Hypertensive chronic kidney disease with stage 1 through stage 4 chronic kidney disease, or unspecified chronic kidney disease: Secondary | ICD-10-CM | POA: Insufficient documentation

## 2018-09-29 DIAGNOSIS — Z7982 Long term (current) use of aspirin: Secondary | ICD-10-CM | POA: Insufficient documentation

## 2018-09-29 DIAGNOSIS — D649 Anemia, unspecified: Secondary | ICD-10-CM

## 2018-09-29 LAB — CBC WITH DIFFERENTIAL/PLATELET
Abs Immature Granulocytes: 0.02 10*3/uL (ref 0.00–0.07)
Basophils Absolute: 0 10*3/uL (ref 0.0–0.1)
Basophils Relative: 1 %
Eosinophils Absolute: 0.5 10*3/uL (ref 0.0–0.5)
Eosinophils Relative: 7 %
HEMATOCRIT: 30.4 % — AB (ref 39.0–52.0)
HEMOGLOBIN: 9.9 g/dL — AB (ref 13.0–17.0)
IMMATURE GRANULOCYTES: 0 %
LYMPHS ABS: 1.7 10*3/uL (ref 0.7–4.0)
LYMPHS PCT: 27 %
MCH: 27.2 pg (ref 26.0–34.0)
MCHC: 32.6 g/dL (ref 30.0–36.0)
MCV: 83.5 fL (ref 80.0–100.0)
Monocytes Absolute: 0.6 10*3/uL (ref 0.1–1.0)
Monocytes Relative: 10 %
NEUTROS ABS: 3.4 10*3/uL (ref 1.7–7.7)
NEUTROS PCT: 55 %
Platelets: 156 10*3/uL (ref 150–400)
RBC: 3.64 MIL/uL — AB (ref 4.22–5.81)
RDW: 14.2 % (ref 11.5–15.5)
WBC: 6.2 10*3/uL (ref 4.0–10.5)
nRBC: 0 % (ref 0.0–0.2)

## 2018-09-29 LAB — COMPREHENSIVE METABOLIC PANEL
ALBUMIN: 4.2 g/dL (ref 3.5–5.0)
ALT: 12 U/L (ref 0–44)
ANION GAP: 9 (ref 5–15)
AST: 16 U/L (ref 15–41)
Alkaline Phosphatase: 80 U/L (ref 38–126)
BUN: 40 mg/dL — ABNORMAL HIGH (ref 8–23)
CHLORIDE: 100 mmol/L (ref 98–111)
CO2: 29 mmol/L (ref 22–32)
Calcium: 9.7 mg/dL (ref 8.9–10.3)
Creatinine, Ser: 2.78 mg/dL — ABNORMAL HIGH (ref 0.61–1.24)
GFR calc Af Amer: 21 mL/min — ABNORMAL LOW (ref >60)
GFR calc non Af Amer: 18 mL/min — ABNORMAL LOW (ref >60)
Glucose, Bld: 163 mg/dL — ABNORMAL HIGH (ref 70–99)
POTASSIUM: 4.5 mmol/L (ref 3.5–5.1)
Sodium: 138 mmol/L (ref 135–145)
Total Bilirubin: 0.7 mg/dL (ref 0.3–1.2)
Total Protein: 7.8 g/dL (ref 6.5–8.1)

## 2018-09-29 MED ORDER — EPOETIN ALFA 20000 UNIT/ML IJ SOLN
20000.0000 [IU] | Freq: Once | INTRAMUSCULAR | Status: AC
  Administered 2018-09-29: 20000 [IU] via SUBCUTANEOUS
  Filled 2018-09-29: qty 1

## 2018-09-29 NOTE — Progress Notes (Signed)
Patient here for follow up no changes since his last appointment. 

## 2018-10-01 DIAGNOSIS — Z79899 Other long term (current) drug therapy: Principal | ICD-10-CM

## 2018-10-01 DIAGNOSIS — Z1331 Encounter for screening for depression: Secondary | ICD-10-CM | POA: Insufficient documentation

## 2018-10-01 DIAGNOSIS — D649 Anemia, unspecified: Secondary | ICD-10-CM | POA: Insufficient documentation

## 2018-10-01 NOTE — Progress Notes (Signed)
[facility] Follow Up visit  Patient Care Team: [name], MD as PCP - General (Internal Medicine)  REASON FOR VISIT Follow up for treatment of anemia due to CKD.   HISTORY OF PRESENTING ILLNESS: Jay Mathis 84 y.o. male with PMH listed as below who presents for follow up of his treatment of anemia.  Pertinent hematology history includes: Patient has recently established care with nephrologist Dr.Kulluru for CKD and was referred to [place] for anemia work up and treatment. Patient at that time reports feeling fatigue, weakness. Noted black stool. He has inguinal hernia for which he has seen surgery and was not recommended for intervention.  He was found to be functional iron deficiency and he has received 2 doses of Feraheme, tolerated well and four weekly dose of 1052mg B12 shot.  Procrit was started on 08/15/2017, he received 40,000 units.  09/12/2017 Procrit 20,000 units # s/p 2 does of Feraheme and 4 weekly dose of Vitamin B12 shots. . Iron store is fully replete.  Interval History Patient presents for the management of anemia of chronic kidney disease. Received Procrit 20,000 unit 12 weeks ago. Reports feeling well at baseline.  Denies fatigue, shortness of breath.  Appetite is good. Chronic lower extremity swelling is at baseline.  Right lower extremity swelling has improved.  Left lower extremity swelling at baseline. Denies any cough tenderness.    Review of Systems  Constitutional: Negative for appetite change, chills, diaphoresis, fatigue, fever and unexpected weight change.  HENT:   Negative for hearing loss, lump/mass, mouth sores, nosebleeds and sore throat.   Eyes: Negative for eye problems and icterus.  Respiratory: Negative for chest tightness, cough, hemoptysis, shortness of breath and wheezing.   Cardiovascular: Positive for leg swelling. Negative for chest pain and palpitations.  Gastrointestinal: Negative for abdominal distention, abdominal  pain, blood in stool, constipation, diarrhea, nausea and rectal pain.  Endocrine: Negative for hot flashes.  Genitourinary: Negative for bladder incontinence, difficulty urinating, dysuria, frequency, hematuria and nocturia.   Musculoskeletal: Negative for arthralgias, back pain, flank pain, gait problem and myalgias.  Skin: Negative for rash and wound.  Neurological: Negative for dizziness, gait problem, headaches, numbness and seizures.  Hematological: Negative for adenopathy. Does not bruise/bleed easily.  Psychiatric/Behavioral: Negative for confusion and decreased concentration. The patient is not nervous/anxious.     MEDICAL HISTORY: Past Medical History:  Diagnosis Date  . CAD (coronary artery disease)   . CKD (chronic kidney disease) stage 4, GFR 15-29 ml/min (HCC)   . Diabetes mellitus [place] complication ([place]   . GERD (gastroesophageal reflux disease)   . Hyperlipidemia   . Hypertension   . Iron deficiency anemia 07/30/2017  . Nephrolithiasis     SURGICAL HISTORY: Past Surgical History:  Procedure Laterality Date  . APPENDECTOMY     60 plus years ago  . CORONARY ANGIOPLASTY WITH STENT PLACEMENT    . CORONARY ARTERY BYPASS GRAFT      SOCIAL HISTORY: Social History   Socioeconomic History  . Marital status: Married    Spouse name: Not on file  . Number of children: Not on file  . Years of education: Not on file  . Highest education level: Not on file  Occupational History  . Not on file  Social Needs  . Financial resource strain: Not on file  . Food insecurity:    Worry: Not on file    Inability: Not on file  . Transportation needs:    Medical: Not on file  Non-medical: Not on file  Tobacco Use  . Smoking status: Former Research scientist (life sciences)  . Smokeless tobacco: Never Used  . Tobacco comment: quit about 60 years ago 2019 dated  Substance and Sexual Activity  . Alcohol use: No  . Drug use: No  . Sexual activity: Not on file  Lifestyle  . Physical activity:     Days per week: Not on file    Minutes per session: Not on file  . Stress: Not on file  Relationships  . Social connections:    Talks on phone: Not on file    Gets together: Not on file    Attends religious service: Not on file    Active member of club or organization: Not on file    Attends meetings of clubs or organizations: Not on file    Relationship status: Not on file  . Intimate partner violence:    Fear of current or ex partner: Not on file    Emotionally abused: Not on file    Physically abused: Not on file    Forced sexual activity: Not on file  Other Topics Concern  . Not on file  Social History Narrative  . Not on file    FAMILY HISTORY Family History  Problem Relation Age of Onset  . Prostate cancer Father   . Prostate cancer Brother     ALLERGIES:  is allergic to sulfa antibiotics.  MEDICATIONS:  Current Outpatient Medications  Medication Sig Dispense Refill  . amLODipine (NORVASC) 10 MG tablet Take 10 mg by mouth daily.    Marland Kitchen aspirin EC 81 MG tablet Take 81 mg by mouth daily.    Marland Kitchen atorvastatin (LIPITOR) 10 MG tablet Take 10 mg by mouth at bedtime.     . carvedilol (COREG) 3.125 MG tablet Take 3.125 mg 2 (two) times daily by mouth.    . ferrous sulfate 325 (65 FE) MG tablet Take 325 mg by mouth 2 (two) times daily with a meal.     . folic acid (FOLVITE) 1 MG tablet Take 1 tablet (1 mg total) by mouth daily. 30 tablet 5  . furosemide (LASIX) 20 MG tablet Take 20 mg by mouth daily.    Marland Kitchen glimepiride (AMARYL) 2 MG tablet Take 2 mg by mouth every evening.     . pantoprazole (PROTONIX) 40 MG tablet Take 40 mg by mouth 2 (two) times daily.    . sodium bicarbonate 650 MG tablet Take 650 mg by mouth 2 (two) times daily.    . tamsulosin (FLOMAX) 0.4 MG CAPS capsule Take 1 capsule by mouth daily.    . vitamin B-12 (CYANOCOBALAMIN) 500 MCG tablet Take 1 tablet (500 mcg total) by mouth daily. 90 tablet 3  . Vitamin D, Ergocalciferol, (DRISDOL) 50000 units CAPS capsule  Take 50,000 Units by mouth every 7 (seven) days.     No current facility-administered medications for this visit.     PHYSICAL EXAMINATION:  ECOG PERFORMANCE STATUS: 0 - Asymptomatic   Vitals:   09/29/18 1352 09/29/18 1400  BP:  (!) 152/65  Pulse:  60  Resp: 20   Temp:  99 F (37.2 C)    Filed Weights   09/29/18 1352  Weight: 149 lb 11.2 oz (67.9 kg)     Physical Exam  Constitutional: He is oriented to person, place, and time. No distress.  HENT:  Head: Normocephalic and atraumatic.  Nose: Nose normal.  Mouth/Throat: Oropharynx is clear and moist. No oropharyngeal exudate.  Eyes: Pupils are equal,  round, and reactive to light. EOM are normal. Left eye exhibits no discharge. No scleral icterus.  Neck: Normal range of motion. Neck supple.  Cardiovascular: Normal rate and regular rhythm.  No murmur heard. Pulmonary/Chest: Effort normal. No respiratory distress. He has no rales. He exhibits no tenderness.  Abdominal: Soft. He exhibits no distension and no mass. There is no tenderness.  Musculoskeletal: Normal range of motion. He exhibits edema.  Neurological: He is alert and oriented to person, place, and time. No cranial nerve deficit. He exhibits normal muscle tone. Coordination normal.  Skin: Skin is warm and dry. He is not diaphoretic. No erythema.  Chronic lower extremity vascular insufficiency hyperpigmentation.  Psychiatric: Affect normal.      LABORATORY DATA: I have personally reviewed the data as listed:  Orders Only on 09/29/2018  Component Date Value Ref Range Status  . Sodium 09/29/2018 138  135 - 145 mmol/L Final  . Potassium 09/29/2018 4.5  3.5 - 5.1 mmol/L Final  . Chloride 09/29/2018 100  98 - 111 mmol/L Final  . CO2 09/29/2018 29  22 - 32 mmol/L Final  . Glucose, Bld 09/29/2018 163* 70 - 99 mg/dL Final  . BUN 09/29/2018 40* 8 - 23 mg/dL Final  . Creatinine, Ser 09/29/2018 2.78* 0.61 - 1.24 mg/dL Final  . Calcium 09/29/2018 9.7  8.9 - 10.3 mg/dL  Final  . Total Protein 09/29/2018 7.8  6.5 - 8.1 g/dL Final  . Albumin 09/29/2018 4.2  3.5 - 5.0 g/dL Final  . AST 09/29/2018 16  15 - 41 U/L Final  . ALT 09/29/2018 12  0 - 44 U/L Final  . Alkaline Phosphatase 09/29/2018 80  38 - 126 U/L Final  . Total Bilirubin 09/29/2018 0.7  0.3 - 1.2 mg/dL Final  . GFR calc non Af Amer 09/29/2018 18* >60 mL/min Final  . GFR calc Af Amer 09/29/2018 21* >60 mL/min Final   Comment: (NOTE) The eGFR has been calculated using the CKD EPI equation. This calculation has not been validated in all clinical situations. eGFR's persistently <60 mL/min signify possible Chronic Kidney Disease.   Georgiann Hahn gap 09/29/2018 9  5 - 15 Final   Performed at [facility], [address] [place], [place] [address]  . WBC 09/29/2018 6.2  4.0 - 10.5 K/uL Final  . RBC 09/29/2018 3.64* 4.22 - 5.81 MIL/uL Final  . Hemoglobin 09/29/2018 9.9* 13.0 - 17.0 g/dL Final  . HCT 09/29/2018 30.4* 39.0 - 52.0 % Final  . MCV 09/29/2018 83.5  80.0 - 100.0 fL Final  . MCH 09/29/2018 27.2  26.0 - 34.0 pg Final  . MCHC 09/29/2018 32.6  30.0 - 36.0 g/dL Final  . RDW 09/29/2018 14.2  11.5 - 15.5 % Final  . Platelets 09/29/2018 156  150 - 400 K/uL Final  . nRBC 09/29/2018 0.0  0.0 - 0.2 % Final  . Neutrophils Relative % 09/29/2018 55  % Final  . Neutro Abs 09/29/2018 3.4  1.7 - 7.7 K/uL Final  . Lymphocytes Relative 09/29/2018 27  % Final  . Lymphs Abs 09/29/2018 1.7  0.7 - 4.0 K/uL Final  . Monocytes Relative 09/29/2018 10  % Final  . Monocytes Absolute 09/29/2018 0.6  0.1 - 1.0 K/uL Final  . Eosinophils Relative 09/29/2018 7  % Final  . Eosinophils Absolute 09/29/2018 0.5  0.0 - 0.5 K/uL Final  . Basophils Relative 09/29/2018 1  % Final  . Basophils Absolute 09/29/2018 0.0  0.0 - 0.1 K/uL Final  .  Immature Granulocytes 09/29/2018 0  % Final  . Abs Immature Granulocytes 09/29/2018 0.02  0.00 - 0.07 K/uL Final   Performed at [facility], [address] [place],  [place] [address]   [facility] results.  07/08/2017 Iron 81, TIBC 298, Saturation 27%, Ferritin 57 Iron/TIBC/Ferritin/ %Sat    Component Value Date/Time   IRON 52 08/13/2017 1049   TIBC 259 08/13/2017 1049   FERRITIN 492 (H) 08/13/2017 1049   IRONPCTSAT 20 08/13/2017 1049   Tested negative for anti parietal antibody or intrinsic factor antibody. He is tested negative for occult blood in stool.   RADIOGRAPHIC STUDIES: I have personally reviewed the radiological images as listed and agree with the findings in the report 06/10/2017 CT abdomen pelvis IMPRESSION: Wide neck small bowel containing indirect right inguinal hernia. Mild stranding of the herniated mesenteric without definite evidence of incarceration. Atrophic appearance of the kidneys. Multiple urinary bladder diverticulae.  ASSESSMENT/PLAN  1. Encounter for ESA (erythropoietin stimulating agent) anemia management   2. CKD (chronic kidney disease) stage 4, GFR 15-29 ml/min (HCC)   3. Anemia in stage 4 chronic kidney disease ([place])    #Labs reviewed and discussed with patient.  Hemoglobin 9.9.  We will proceed with Procrit 20,000 unit x 1. Repeat CBC in 12 weeks and see me for assessment for additional need for Procrit. Will check iron panel at that time as well.  #History of vitamin B12 deficiency.  Continue B12 oral supplements check B12 in 3 months. #Systolic heart murmur, likely aortic stenosis.  Continue folic acid supplements.  Follow up in 3 months  with repeat labs/possible procrit  Orders Placed This Encounter  Procedures  . CBC with Differential/Platelet    Standing Status:   Future    Standing Expiration Date:   09/30/2019  . Vitamin B12    Standing Status:   Future    Standing Expiration Date:   01/30/2019  . Iron and TIBC    Standing Status:   Future    Standing Expiration Date:   10/02/2019  . Ferritin    Standing Status:   Future    Standing Expiration Date:   10/02/2019   Total face to face encounter time  for this patient visit was 15 min. >50% of the time was  spent in counseling and coordination of care.  Earlie Server, MD, PhD Hematology Oncology [facility] at [facility] Pager- [phone] 10/01/18

## 2018-10-02 ENCOUNTER — Ambulatory Visit: Payer: Medicare HMO | Admitting: Oncology

## 2018-10-02 ENCOUNTER — Ambulatory Visit: Payer: Medicare HMO

## 2018-10-02 ENCOUNTER — Other Ambulatory Visit: Payer: Medicare HMO

## 2018-12-15 DIAGNOSIS — E872 Acidosis: Secondary | ICD-10-CM | POA: Diagnosis not present

## 2018-12-15 DIAGNOSIS — N184 Chronic kidney disease, stage 4 (severe): Secondary | ICD-10-CM | POA: Diagnosis not present

## 2018-12-15 DIAGNOSIS — R809 Proteinuria, unspecified: Secondary | ICD-10-CM | POA: Diagnosis not present

## 2018-12-15 DIAGNOSIS — E1122 Type 2 diabetes mellitus with diabetic chronic kidney disease: Secondary | ICD-10-CM | POA: Diagnosis not present

## 2018-12-15 DIAGNOSIS — D631 Anemia in chronic kidney disease: Secondary | ICD-10-CM | POA: Diagnosis not present

## 2018-12-15 DIAGNOSIS — N2581 Secondary hyperparathyroidism of renal origin: Secondary | ICD-10-CM | POA: Diagnosis not present

## 2018-12-15 DIAGNOSIS — I129 Hypertensive chronic kidney disease with stage 1 through stage 4 chronic kidney disease, or unspecified chronic kidney disease: Secondary | ICD-10-CM | POA: Diagnosis not present

## 2018-12-16 DIAGNOSIS — D638 Anemia in other chronic diseases classified elsewhere: Secondary | ICD-10-CM | POA: Diagnosis not present

## 2018-12-16 DIAGNOSIS — N183 Chronic kidney disease, stage 3 (moderate): Secondary | ICD-10-CM | POA: Diagnosis not present

## 2018-12-16 DIAGNOSIS — N400 Enlarged prostate without lower urinary tract symptoms: Secondary | ICD-10-CM | POA: Diagnosis not present

## 2018-12-16 DIAGNOSIS — Z0000 Encounter for general adult medical examination without abnormal findings: Secondary | ICD-10-CM | POA: Diagnosis not present

## 2018-12-16 DIAGNOSIS — N189 Chronic kidney disease, unspecified: Secondary | ICD-10-CM | POA: Diagnosis not present

## 2018-12-29 ENCOUNTER — Other Ambulatory Visit: Payer: Self-pay | Admitting: Oncology

## 2018-12-31 ENCOUNTER — Encounter: Payer: Self-pay | Admitting: Oncology

## 2018-12-31 ENCOUNTER — Inpatient Hospital Stay: Payer: Medicare HMO

## 2018-12-31 ENCOUNTER — Other Ambulatory Visit: Payer: Self-pay | Admitting: Oncology

## 2018-12-31 ENCOUNTER — Inpatient Hospital Stay (HOSPITAL_BASED_OUTPATIENT_CLINIC_OR_DEPARTMENT_OTHER): Payer: Medicare HMO | Admitting: Oncology

## 2018-12-31 ENCOUNTER — Other Ambulatory Visit: Payer: Self-pay

## 2018-12-31 ENCOUNTER — Inpatient Hospital Stay: Payer: Medicare HMO | Attending: Oncology

## 2018-12-31 VITALS — BP 163/71 | HR 66 | Temp 96.5°F | Resp 18 | Wt 157.9 lb

## 2018-12-31 DIAGNOSIS — R011 Cardiac murmur, unspecified: Secondary | ICD-10-CM | POA: Diagnosis not present

## 2018-12-31 DIAGNOSIS — R0602 Shortness of breath: Secondary | ICD-10-CM | POA: Diagnosis not present

## 2018-12-31 DIAGNOSIS — I129 Hypertensive chronic kidney disease with stage 1 through stage 4 chronic kidney disease, or unspecified chronic kidney disease: Secondary | ICD-10-CM | POA: Diagnosis not present

## 2018-12-31 DIAGNOSIS — E785 Hyperlipidemia, unspecified: Secondary | ICD-10-CM | POA: Insufficient documentation

## 2018-12-31 DIAGNOSIS — D631 Anemia in chronic kidney disease: Secondary | ICD-10-CM | POA: Diagnosis not present

## 2018-12-31 DIAGNOSIS — E538 Deficiency of other specified B group vitamins: Secondary | ICD-10-CM | POA: Diagnosis not present

## 2018-12-31 DIAGNOSIS — N184 Chronic kidney disease, stage 4 (severe): Secondary | ICD-10-CM | POA: Insufficient documentation

## 2018-12-31 DIAGNOSIS — I2510 Atherosclerotic heart disease of native coronary artery without angina pectoris: Secondary | ICD-10-CM

## 2018-12-31 DIAGNOSIS — E1122 Type 2 diabetes mellitus with diabetic chronic kidney disease: Secondary | ICD-10-CM

## 2018-12-31 DIAGNOSIS — I998 Other disorder of circulatory system: Secondary | ICD-10-CM

## 2018-12-31 DIAGNOSIS — D649 Anemia, unspecified: Secondary | ICD-10-CM

## 2018-12-31 DIAGNOSIS — Z7982 Long term (current) use of aspirin: Secondary | ICD-10-CM | POA: Diagnosis not present

## 2018-12-31 DIAGNOSIS — Z87891 Personal history of nicotine dependence: Secondary | ICD-10-CM | POA: Diagnosis not present

## 2018-12-31 DIAGNOSIS — K219 Gastro-esophageal reflux disease without esophagitis: Secondary | ICD-10-CM

## 2018-12-31 DIAGNOSIS — Z7984 Long term (current) use of oral hypoglycemic drugs: Secondary | ICD-10-CM | POA: Insufficient documentation

## 2018-12-31 DIAGNOSIS — D509 Iron deficiency anemia, unspecified: Secondary | ICD-10-CM

## 2018-12-31 DIAGNOSIS — M7989 Other specified soft tissue disorders: Secondary | ICD-10-CM | POA: Insufficient documentation

## 2018-12-31 DIAGNOSIS — Z79899 Other long term (current) drug therapy: Secondary | ICD-10-CM

## 2018-12-31 DIAGNOSIS — E611 Iron deficiency: Secondary | ICD-10-CM | POA: Insufficient documentation

## 2018-12-31 LAB — CBC WITH DIFFERENTIAL/PLATELET
ABS IMMATURE GRANULOCYTES: 0.01 10*3/uL (ref 0.00–0.07)
Basophils Absolute: 0.1 10*3/uL (ref 0.0–0.1)
Basophils Relative: 1 %
Eosinophils Absolute: 0.4 10*3/uL (ref 0.0–0.5)
Eosinophils Relative: 7 %
HEMATOCRIT: 29.8 % — AB (ref 39.0–52.0)
Hemoglobin: 9.6 g/dL — ABNORMAL LOW (ref 13.0–17.0)
IMMATURE GRANULOCYTES: 0 %
LYMPHS ABS: 1.5 10*3/uL (ref 0.7–4.0)
Lymphocytes Relative: 28 %
MCH: 26.6 pg (ref 26.0–34.0)
MCHC: 32.2 g/dL (ref 30.0–36.0)
MCV: 82.5 fL (ref 80.0–100.0)
MONO ABS: 0.6 10*3/uL (ref 0.1–1.0)
MONOS PCT: 11 %
NEUTROS ABS: 3 10*3/uL (ref 1.7–7.7)
NEUTROS PCT: 53 %
Platelets: 177 10*3/uL (ref 150–400)
RBC: 3.61 MIL/uL — ABNORMAL LOW (ref 4.22–5.81)
RDW: 14.1 % (ref 11.5–15.5)
WBC: 5.5 10*3/uL (ref 4.0–10.5)
nRBC: 0 % (ref 0.0–0.2)

## 2018-12-31 LAB — FERRITIN: FERRITIN: 31 ng/mL (ref 24–336)

## 2018-12-31 LAB — IRON AND TIBC
IRON: 67 ug/dL (ref 45–182)
Saturation Ratios: 19 % (ref 17.9–39.5)
TIBC: 358 ug/dL (ref 250–450)
UIBC: 291 ug/dL

## 2018-12-31 LAB — VITAMIN B12: Vitamin B-12: 1101 pg/mL — ABNORMAL HIGH (ref 180–914)

## 2018-12-31 MED ORDER — EPOETIN ALFA 40000 UNIT/ML IJ SOLN
40000.0000 [IU] | Freq: Once | INTRAMUSCULAR | Status: AC
  Administered 2018-12-31: 40000 [IU] via SUBCUTANEOUS
  Filled 2018-12-31: qty 1

## 2018-12-31 MED ORDER — FOLIC ACID 1 MG PO TABS: 1.0000 mg | ORAL_TABLET | Freq: Every day | ORAL | 4 refills | Status: DC

## 2018-12-31 NOTE — Progress Notes (Signed)
Patient here for follow up. No concerns voiced.  °

## 2018-12-31 NOTE — Progress Notes (Signed)
[facility] Follow Up visit  Patient Care Team: [name], MD as PCP - General (Internal Medicine)  REASON FOR VISIT Follow up for treatment of anemia due to CKD.   HISTORY OF PRESENTING ILLNESS: IZEN PETZ 85 y.o. male with PMH listed as below who presents for follow up of his treatment of anemia.  Pertinent hematology history includes: Patient has recently established care with nephrologist Dr.Kulluru for CKD and was referred to [place] for anemia work up and treatment. Patient at that time reports feeling fatigue, weakness. Noted black stool. He has inguinal hernia for which he has seen surgery and was not recommended for intervention.  He was found to be functional iron deficiency and he has received 2 doses of Feraheme, tolerated well and four weekly dose of 1096mcg B12 shot.  Procrit was started on 08/15/2017, he received 40,000 units.  09/12/2017 Procrit 20,000 units # s/p 2 does of Feraheme and 4 weekly dose of Vitamin B12 shots. . Iron store is fully replete.  Interval History Patient presents for the management of anemia of chronic kidney disease. Patient has been on Procrit injections.  Last Procrit was given approximately 3 months ago.  Patient reports feeling at baseline.  He remains active, doing a lot of home chores. He had of fall recently when he was trying to lift up something and lost balance.  Landed on his butt.  He wore very thick coat that day and was not heard it.  Accompanied by son.  Patient has chronic lower extremity swelling.  Reports that left lower extremity swelling gets worse at the end of the day.  Usually improved in the morning.  Right lower extremity swelling is less. Denies any calf pain.  Shortness of breath, chest pain, abdominal pain.   Review of Systems  Constitutional: Negative for appetite change, chills, fatigue, fever and unexpected weight change.  HENT:   Negative for hearing loss and voice change.   Eyes: Negative  for eye problems and icterus.  Respiratory: Negative for chest tightness, cough and shortness of breath.   Cardiovascular: Positive for leg swelling. Negative for chest pain.  Gastrointestinal: Negative for abdominal distention and abdominal pain.  Endocrine: Negative for hot flashes.  Genitourinary: Negative for difficulty urinating, dysuria and frequency.   Musculoskeletal: Negative for arthralgias.  Skin: Negative for itching and rash.  Neurological: Negative for light-headedness and numbness.  Hematological: Negative for adenopathy. Does not bruise/bleed easily.  Psychiatric/Behavioral: Negative for confusion.    MEDICAL HISTORY: Past Medical History:  Diagnosis Date  . CAD (coronary artery disease)   . CKD (chronic kidney disease) stage 4, GFR 15-29 ml/min (HCC)   . Diabetes mellitus without complication ([place])   . GERD (gastroesophageal reflux disease)   . Hyperlipidemia   . Hypertension   . Iron deficiency anemia 07/30/2017  . Nephrolithiasis     SURGICAL HISTORY: Past Surgical History:  Procedure Laterality Date  . APPENDECTOMY     60 plus years ago  . CORONARY ANGIOPLASTY WITH STENT PLACEMENT    . CORONARY ARTERY BYPASS GRAFT      SOCIAL HISTORY: Social History   Socioeconomic History  . Marital status: Married    Spouse name: Not on file  . Number of children: Not on file  . Years of education: Not on file  . Highest education level: Not on file  Occupational History  . Not on file  Social Needs  . Financial resource strain: Not on file  . Food insecurity:  Worry: Not on file    Inability: Not on file  . Transportation needs:    Medical: Not on file    Non-medical: Not on file  Tobacco Use  . Smoking status: Former Research scientist (life sciences)  . Smokeless tobacco: Never Used  . Tobacco comment: quit about 60 years ago 2019 dated  Substance and Sexual Activity  . Alcohol use: No  . Drug use: No  . Sexual activity: Not on file  Lifestyle  . Physical activity:     Days per week: Not on file    Minutes per session: Not on file  . Stress: Not on file  Relationships  . Social connections:    Talks on phone: Not on file    Gets together: Not on file    Attends religious service: Not on file    Active member of club or organization: Not on file    Attends meetings of clubs or organizations: Not on file    Relationship status: Not on file  . Intimate partner violence:    Fear of current or ex partner: Not on file    Emotionally abused: Not on file    Physically abused: Not on file    Forced sexual activity: Not on file  Other Topics Concern  . Not on file  Social History Narrative  . Not on file    FAMILY HISTORY Family History  Problem Relation Age of Onset  . Prostate cancer Father   . Prostate cancer Brother     ALLERGIES:  is allergic to sulfa antibiotics.  MEDICATIONS:  Current Outpatient Medications  Medication Sig Dispense Refill  . amLODipine (NORVASC) 10 MG tablet Take 10 mg by mouth daily.    Marland Kitchen atorvastatin (LIPITOR) 10 MG tablet Take 10 mg by mouth at bedtime.     . carvedilol (COREG) 3.125 MG tablet Take 3.125 mg 2 (two) times daily by mouth.    . folic acid (FOLVITE) 1 MG tablet Take 1 tablet (1 mg total) by mouth daily. 30 tablet 5  . furosemide (LASIX) 20 MG tablet Take 20 mg by mouth daily.    Marland Kitchen glimepiride (AMARYL) 2 MG tablet Take 2 mg by mouth every evening.     . pantoprazole (PROTONIX) 40 MG tablet Take 40 mg by mouth 2 (two) times daily.    . sodium bicarbonate 650 MG tablet Take 650 mg by mouth 2 (two) times daily.    . tamsulosin (FLOMAX) 0.4 MG CAPS capsule Take 1 capsule by mouth daily.    . vitamin B-12 (CYANOCOBALAMIN) 500 MCG tablet Take 1 tablet (500 mcg total) by mouth daily. 90 tablet 3  . Vitamin D, Ergocalciferol, (DRISDOL) 50000 units CAPS capsule Take 50,000 Units by mouth every 7 (seven) days.    Marland Kitchen aspirin EC 81 MG tablet Take 81 mg by mouth daily.    . ferrous sulfate 325 (65 FE) MG tablet Take 325  mg by mouth 2 (two) times daily with a meal.      No current facility-administered medications for this visit.     PHYSICAL EXAMINATION:  ECOG PERFORMANCE STATUS: 0 - Asymptomatic   Vitals:   12/31/18 1324  BP: (!) 163/71  Pulse: 66  Resp: 18  Temp: (!) 96.5 F (35.8 C)    Filed Weights   12/31/18 1324  Weight: 157 lb 14.4 oz (71.6 kg)     Physical Exam  Constitutional: He is oriented to person, place, and time. No distress.  HENT:  Head: Normocephalic and  atraumatic.  Nose: Nose normal.  Mouth/Throat: Oropharynx is clear and moist. No oropharyngeal exudate.  Eyes: Pupils are equal, round, and reactive to light. EOM are normal. Left eye exhibits no discharge. No scleral icterus.  Neck: Normal range of motion. Neck supple.  Cardiovascular: Normal rate and regular rhythm.  No murmur heard. Pulmonary/Chest: Effort normal. No respiratory distress. He has no rales. He exhibits no tenderness.  Abdominal: Soft. He exhibits no distension and no mass. There is no abdominal tenderness.  Musculoskeletal: Normal range of motion.        General: Edema present.  Neurological: He is alert and oriented to person, place, and time. No cranial nerve deficit. He exhibits normal muscle tone. Coordination normal.  Skin: Skin is warm and dry. He is not diaphoretic. No erythema.  Chronic lower extremity vascular insufficiency hyperpigmentation.  Psychiatric: Affect normal.      LABORATORY DATA: I have personally reviewed the data as listed:  Appointment on 12/31/2018  Component Date Value Ref Range Status  . Ferritin 12/31/2018 31  24 - 336 ng/mL Final   Performed at [facility], [address] [place], [place] [address]  . Iron 12/31/2018 67  45 - 182 ug/dL Final  . TIBC 12/31/2018 358  250 - 450 ug/dL Final  . Saturation Ratios 12/31/2018 19  17.9 - 39.5 % Final  . UIBC 12/31/2018 291  ug/dL Final   Performed at [facility], [address], [place],  [place] [address]  . WBC 12/31/2018 5.5  4.0 - 10.5 K/uL Final  . RBC 12/31/2018 3.61* 4.22 - 5.81 MIL/uL Final  . Hemoglobin 12/31/2018 9.6* 13.0 - 17.0 g/dL Final  . HCT 12/31/2018 29.8* 39.0 - 52.0 % Final  . MCV 12/31/2018 82.5  80.0 - 100.0 fL Final  . MCH 12/31/2018 26.6  26.0 - 34.0 pg Final  . MCHC 12/31/2018 32.2  30.0 - 36.0 g/dL Final  . RDW 12/31/2018 14.1  11.5 - 15.5 % Final  . Platelets 12/31/2018 177  150 - 400 K/uL Final  . nRBC 12/31/2018 0.0  0.0 - 0.2 % Final  . Neutrophils Relative % 12/31/2018 53  % Final  . Neutro Abs 12/31/2018 3.0  1.7 - 7.7 K/uL Final  . Lymphocytes Relative 12/31/2018 28  % Final  . Lymphs Abs 12/31/2018 1.5  0.7 - 4.0 K/uL Final  . Monocytes Relative 12/31/2018 11  % Final  . Monocytes Absolute 12/31/2018 0.6  0.1 - 1.0 K/uL Final  . Eosinophils Relative 12/31/2018 7  % Final  . Eosinophils Absolute 12/31/2018 0.4  0.0 - 0.5 K/uL Final  . Basophils Relative 12/31/2018 1  % Final  . Basophils Absolute 12/31/2018 0.1  0.0 - 0.1 K/uL Final  . Immature Granulocytes 12/31/2018 0  % Final  . Abs Immature Granulocytes 12/31/2018 0.01  0.00 - 0.07 K/uL Final   Performed at [facility], [address] [place], [place] [address]   [facility] results.  07/08/2017 Iron 81, TIBC 298, Saturation 27%, Ferritin 57 Iron/TIBC/Ferritin/ %Sat    Component Value Date/Time   IRON 67 12/31/2018 1253   TIBC 358 12/31/2018 1253   FERRITIN 31 12/31/2018 1253   IRONPCTSAT 19 12/31/2018 1253   Tested negative for anti parietal antibody or intrinsic factor antibody. He is tested negative for occult blood in stool.   RADIOGRAPHIC STUDIES: I have personally reviewed the radiological images as listed and agree with the findings in the report 06/10/2017 CT abdomen pelvis IMPRESSION: Wide neck small  bowel containing indirect right inguinal hernia. Mild stranding of the herniated mesenteric without definite evidence of incarceration. Atrophic appearance of the  kidneys. Multiple urinary bladder diverticulae.  ASSESSMENT/PLAN  1. Encounter for ESA (erythropoietin stimulating agent) anemia management   2. Iron deficiency anemia, unspecified iron deficiency anemia type   3. Anemia in stage 4 chronic kidney disease ([place])   4. Systolic murmur   5. B12 deficiency   6. Vascular insufficiency of extremity    #Labs reviewed and discussed with patient. Hemoglobin dropped to 9.6.  Last Procrit was given 3 months ago. We will proceed with Procrit 40,000 unit x 1. Repeat CBC in 4 weeks and 8 weeks We will proceed with Procrit 20,000 units if hemoglobin less than or equal than 10.  #History of vitamin B12 deficiency.  Continue B12 oral supplements.  Check B12 today.  Results pending. #Systolic heart murmur, likely aortic stenosis.  Continue folic acid supplements. #History of iron deficiency.  Repeat iron panel showed decreased ferritin to 31, saturation 19. We will proceed with 1 dose of IV iron with Feraheme.  He has received IV Feraheme in the past.  Tolerated well. Discussed with patient about the plan.  He is in agreement   Follow up in 3 months  with repeat labs/possible procrit  Orders Placed This Encounter  Procedures  . CBC with Differential/Platelet    Standing Status:   Standing    Number of Occurrences:   6    Standing Expiration Date:   01/01/2020   Earlie Server, MD, PhD Hematology Oncology [facility] Pager- [phone] 12/31/18

## 2018-12-31 NOTE — Progress Notes (Signed)
MD approves to proceed with Procrit today with blood pressure 163/71 in office today

## 2019-01-01 ENCOUNTER — Telehealth: Payer: Self-pay | Admitting: *Deleted

## 2019-01-01 NOTE — Telephone Encounter (Signed)
One dose of *NEW* Feraheme in 2 weeks per Dr. Tasia Catchings 01/01/19 Result message: Appt was scheduled as requested. Roald Lukacs (Son) is aware of the scheduled time and time of his appt. A reminder letter will be mailed out as well.

## 2019-01-14 ENCOUNTER — Other Ambulatory Visit: Payer: Self-pay | Admitting: Oncology

## 2019-01-15 ENCOUNTER — Ambulatory Visit: Payer: Medicare HMO

## 2019-01-16 ENCOUNTER — Inpatient Hospital Stay: Payer: Medicare HMO | Attending: Oncology

## 2019-01-16 VITALS — BP 152/67 | HR 63 | Temp 97.7°F | Resp 18

## 2019-01-16 DIAGNOSIS — N184 Chronic kidney disease, stage 4 (severe): Secondary | ICD-10-CM | POA: Diagnosis not present

## 2019-01-16 DIAGNOSIS — D631 Anemia in chronic kidney disease: Secondary | ICD-10-CM | POA: Diagnosis not present

## 2019-01-16 DIAGNOSIS — E611 Iron deficiency: Secondary | ICD-10-CM | POA: Insufficient documentation

## 2019-01-16 DIAGNOSIS — D509 Iron deficiency anemia, unspecified: Secondary | ICD-10-CM

## 2019-01-16 MED ORDER — SODIUM CHLORIDE 0.9 % IV SOLN
510.0000 mg | Freq: Once | INTRAVENOUS | Status: AC
  Administered 2019-01-16: 510 mg via INTRAVENOUS
  Filled 2019-01-16: qty 17

## 2019-01-16 MED ORDER — SODIUM CHLORIDE 0.9 % IV SOLN
Freq: Once | INTRAVENOUS | Status: AC
  Administered 2019-01-16: 11:00:00 via INTRAVENOUS
  Filled 2019-01-16: qty 250

## 2019-01-28 ENCOUNTER — Inpatient Hospital Stay: Payer: Medicare HMO

## 2019-01-28 VITALS — BP 162/59 | HR 69

## 2019-01-28 DIAGNOSIS — N184 Chronic kidney disease, stage 4 (severe): Secondary | ICD-10-CM | POA: Diagnosis not present

## 2019-01-28 DIAGNOSIS — D631 Anemia in chronic kidney disease: Secondary | ICD-10-CM | POA: Diagnosis not present

## 2019-01-28 DIAGNOSIS — D509 Iron deficiency anemia, unspecified: Secondary | ICD-10-CM

## 2019-01-28 DIAGNOSIS — E611 Iron deficiency: Secondary | ICD-10-CM | POA: Diagnosis not present

## 2019-01-28 LAB — CBC WITH DIFFERENTIAL/PLATELET
Abs Immature Granulocytes: 0.01 K/uL (ref 0.00–0.07)
Basophils Absolute: 0 K/uL (ref 0.0–0.1)
Basophils Relative: 0 %
Eosinophils Absolute: 0.3 K/uL (ref 0.0–0.5)
Eosinophils Relative: 5 %
HCT: 29.3 % — ABNORMAL LOW (ref 39.0–52.0)
Hemoglobin: 9.4 g/dL — ABNORMAL LOW (ref 13.0–17.0)
Immature Granulocytes: 0 %
Lymphocytes Relative: 26 %
Lymphs Abs: 1.6 K/uL (ref 0.7–4.0)
MCH: 26.3 pg (ref 26.0–34.0)
MCHC: 32.1 g/dL (ref 30.0–36.0)
MCV: 81.8 fL (ref 80.0–100.0)
Monocytes Absolute: 0.7 K/uL (ref 0.1–1.0)
Monocytes Relative: 11 %
Neutro Abs: 3.5 K/uL (ref 1.7–7.7)
Neutrophils Relative %: 58 %
Platelets: 189 K/uL (ref 150–400)
RBC: 3.58 MIL/uL — ABNORMAL LOW (ref 4.22–5.81)
RDW: 14.8 % (ref 11.5–15.5)
WBC: 6.1 K/uL (ref 4.0–10.5)
nRBC: 0 % (ref 0.0–0.2)

## 2019-01-28 MED ORDER — EPOETIN ALFA 20000 UNIT/ML IJ SOLN
20000.0000 [IU] | Freq: Once | INTRAMUSCULAR | Status: AC
  Administered 2019-01-28: 20000 [IU] via SUBCUTANEOUS

## 2019-01-28 NOTE — Progress Notes (Signed)
Per Janeann Merl RN patient is ok to get injection  After asking Dr. Earlie Server.

## 2019-02-25 ENCOUNTER — Inpatient Hospital Stay: Payer: Medicare HMO

## 2019-03-10 ENCOUNTER — Inpatient Hospital Stay: Payer: Medicare HMO

## 2019-03-24 ENCOUNTER — Telehealth: Payer: Self-pay | Admitting: *Deleted

## 2019-03-24 NOTE — Telephone Encounter (Signed)
Jay Mathis (Son) stated that he wanted to Cancel appts for 03/25/19 due to [place] and will call back to R/S at a later date.

## 2019-03-25 ENCOUNTER — Inpatient Hospital Stay: Payer: Medicare HMO

## 2019-03-25 ENCOUNTER — Inpatient Hospital Stay: Payer: Medicare HMO | Admitting: Oncology

## 2019-08-03 DIAGNOSIS — E1122 Type 2 diabetes mellitus with diabetic chronic kidney disease: Secondary | ICD-10-CM | POA: Diagnosis not present

## 2019-08-03 DIAGNOSIS — N184 Chronic kidney disease, stage 4 (severe): Secondary | ICD-10-CM | POA: Diagnosis not present

## 2019-08-03 DIAGNOSIS — I129 Hypertensive chronic kidney disease with stage 1 through stage 4 chronic kidney disease, or unspecified chronic kidney disease: Secondary | ICD-10-CM | POA: Diagnosis not present

## 2019-08-03 DIAGNOSIS — E875 Hyperkalemia: Secondary | ICD-10-CM | POA: Diagnosis not present

## 2019-08-03 DIAGNOSIS — R809 Proteinuria, unspecified: Secondary | ICD-10-CM | POA: Diagnosis not present

## 2019-08-07 ENCOUNTER — Inpatient Hospital Stay: Payer: Medicare Other

## 2019-08-07 ENCOUNTER — Telehealth: Payer: Self-pay | Admitting: *Deleted

## 2019-08-07 ENCOUNTER — Emergency Department: Payer: Medicare Other

## 2019-08-07 ENCOUNTER — Inpatient Hospital Stay
Admission: EM | Admit: 2019-08-07 | Discharge: 2019-08-10 | DRG: 690 | Disposition: A | Discharge status: Home Health | Payer: Medicare Other | Attending: Internal Medicine | Admitting: Internal Medicine

## 2019-08-07 ENCOUNTER — Other Ambulatory Visit: Payer: Self-pay

## 2019-08-07 DIAGNOSIS — Z955 Presence of coronary angioplasty implant and graft: Secondary | ICD-10-CM | POA: Diagnosis not present

## 2019-08-07 DIAGNOSIS — B951 Streptococcus, group B, as the cause of diseases classified elsewhere: Secondary | ICD-10-CM | POA: Diagnosis not present

## 2019-08-07 DIAGNOSIS — E1122 Type 2 diabetes mellitus with diabetic chronic kidney disease: Secondary | ICD-10-CM | POA: Diagnosis not present

## 2019-08-07 DIAGNOSIS — R4182 Altered mental status, unspecified: Secondary | ICD-10-CM | POA: Diagnosis not present

## 2019-08-07 DIAGNOSIS — K4090 Unilateral inguinal hernia, without obstruction or gangrene, not specified as recurrent: Secondary | ICD-10-CM | POA: Diagnosis not present

## 2019-08-07 DIAGNOSIS — I2510 Atherosclerotic heart disease of native coronary artery without angina pectoris: Secondary | ICD-10-CM | POA: Diagnosis not present

## 2019-08-07 DIAGNOSIS — R0902 Hypoxemia: Secondary | ICD-10-CM | POA: Diagnosis not present

## 2019-08-07 DIAGNOSIS — N323 Diverticulum of bladder: Secondary | ICD-10-CM | POA: Diagnosis not present

## 2019-08-07 DIAGNOSIS — Z951 Presence of aortocoronary bypass graft: Secondary | ICD-10-CM | POA: Diagnosis not present

## 2019-08-07 DIAGNOSIS — Z87442 Personal history of urinary calculi: Secondary | ICD-10-CM | POA: Diagnosis not present

## 2019-08-07 DIAGNOSIS — Z66 Do not resuscitate: Secondary | ICD-10-CM | POA: Diagnosis not present

## 2019-08-07 DIAGNOSIS — Z87891 Personal history of nicotine dependence: Secondary | ICD-10-CM | POA: Diagnosis not present

## 2019-08-07 DIAGNOSIS — Z87440 Personal history of urinary (tract) infections: Secondary | ICD-10-CM | POA: Diagnosis not present

## 2019-08-07 DIAGNOSIS — I129 Hypertensive chronic kidney disease with stage 1 through stage 4 chronic kidney disease, or unspecified chronic kidney disease: Secondary | ICD-10-CM | POA: Diagnosis present

## 2019-08-07 DIAGNOSIS — I10 Essential (primary) hypertension: Secondary | ICD-10-CM | POA: Diagnosis not present

## 2019-08-07 DIAGNOSIS — N390 Urinary tract infection, site not specified: Secondary | ICD-10-CM | POA: Diagnosis not present

## 2019-08-07 DIAGNOSIS — N179 Acute kidney failure, unspecified: Secondary | ICD-10-CM | POA: Diagnosis present

## 2019-08-07 DIAGNOSIS — E785 Hyperlipidemia, unspecified: Secondary | ICD-10-CM | POA: Diagnosis not present

## 2019-08-07 DIAGNOSIS — Z7982 Long term (current) use of aspirin: Secondary | ICD-10-CM

## 2019-08-07 DIAGNOSIS — H9190 Unspecified hearing loss, unspecified ear: Secondary | ICD-10-CM | POA: Diagnosis not present

## 2019-08-07 DIAGNOSIS — N136 Pyonephrosis: Secondary | ICD-10-CM | POA: Diagnosis not present

## 2019-08-07 DIAGNOSIS — N1330 Unspecified hydronephrosis: Secondary | ICD-10-CM | POA: Diagnosis not present

## 2019-08-07 DIAGNOSIS — N134 Hydroureter: Secondary | ICD-10-CM | POA: Diagnosis not present

## 2019-08-07 DIAGNOSIS — N184 Chronic kidney disease, stage 4 (severe): Secondary | ICD-10-CM | POA: Diagnosis not present

## 2019-08-07 DIAGNOSIS — Z20828 Contact with and (suspected) exposure to other viral communicable diseases: Secondary | ICD-10-CM | POA: Diagnosis present

## 2019-08-07 DIAGNOSIS — Z882 Allergy status to sulfonamides status: Secondary | ICD-10-CM | POA: Diagnosis not present

## 2019-08-07 DIAGNOSIS — D638 Anemia in other chronic diseases classified elsewhere: Secondary | ICD-10-CM | POA: Diagnosis present

## 2019-08-07 DIAGNOSIS — Z8719 Personal history of other diseases of the digestive system: Secondary | ICD-10-CM | POA: Diagnosis not present

## 2019-08-07 DIAGNOSIS — I4891 Unspecified atrial fibrillation: Secondary | ICD-10-CM | POA: Diagnosis not present

## 2019-08-07 DIAGNOSIS — Z8042 Family history of malignant neoplasm of prostate: Secondary | ICD-10-CM | POA: Diagnosis not present

## 2019-08-07 DIAGNOSIS — R0989 Other specified symptoms and signs involving the circulatory and respiratory systems: Secondary | ICD-10-CM | POA: Diagnosis not present

## 2019-08-07 DIAGNOSIS — R531 Weakness: Secondary | ICD-10-CM

## 2019-08-07 DIAGNOSIS — K5730 Diverticulosis of large intestine without perforation or abscess without bleeding: Secondary | ICD-10-CM | POA: Diagnosis not present

## 2019-08-07 DIAGNOSIS — R609 Edema, unspecified: Secondary | ICD-10-CM | POA: Diagnosis not present

## 2019-08-07 DIAGNOSIS — R52 Pain, unspecified: Secondary | ICD-10-CM | POA: Diagnosis not present

## 2019-08-07 LAB — COMPREHENSIVE METABOLIC PANEL
ALT: 17 U/L (ref 0–44)
AST: 19 U/L (ref 15–41)
Albumin: 3.3 g/dL — ABNORMAL LOW (ref 3.5–5.0)
Alkaline Phosphatase: 98 U/L (ref 38–126)
Anion gap: 14 (ref 5–15)
BUN: 50 mg/dL — ABNORMAL HIGH (ref 8–23)
CO2: 24 mmol/L (ref 22–32)
Calcium: 8.6 mg/dL — ABNORMAL LOW (ref 8.9–10.3)
Chloride: 92 mmol/L — ABNORMAL LOW (ref 98–111)
Creatinine, Ser: 3.01 mg/dL — ABNORMAL HIGH (ref 0.61–1.24)
GFR calc Af Amer: 20 mL/min — ABNORMAL LOW (ref >60)
GFR calc non Af Amer: 17 mL/min — ABNORMAL LOW (ref >60)
Glucose, Bld: 198 mg/dL — ABNORMAL HIGH (ref 70–99)
Potassium: 3.7 mmol/L (ref 3.5–5.1)
Sodium: 130 mmol/L — ABNORMAL LOW (ref 135–145)
Total Bilirubin: 0.8 mg/dL (ref 0.3–1.2)
Total Protein: 7.1 g/dL (ref 6.5–8.1)

## 2019-08-07 LAB — SARS CORONAVIRUS 2 BY RT PCR (HOSPITAL ORDER, PERFORMED IN ~~LOC~~ HOSPITAL LAB): SARS Coronavirus 2: NEGATIVE

## 2019-08-07 LAB — CBC WITH DIFFERENTIAL/PLATELET
Abs Immature Granulocytes: 0.05 10*3/uL (ref 0.00–0.07)
Basophils Absolute: 0 10*3/uL (ref 0.0–0.1)
Basophils Relative: 0 %
Eosinophils Absolute: 0.1 10*3/uL (ref 0.0–0.5)
Eosinophils Relative: 1 %
HCT: 27 % — ABNORMAL LOW (ref 39.0–52.0)
Hemoglobin: 8.8 g/dL — ABNORMAL LOW (ref 13.0–17.0)
Immature Granulocytes: 0 %
Lymphocytes Relative: 10 %
Lymphs Abs: 1.2 10*3/uL (ref 0.7–4.0)
MCH: 27.9 pg (ref 26.0–34.0)
MCHC: 32.6 g/dL (ref 30.0–36.0)
MCV: 85.7 fL (ref 80.0–100.0)
Monocytes Absolute: 1.4 10*3/uL — ABNORMAL HIGH (ref 0.1–1.0)
Monocytes Relative: 12 %
Neutro Abs: 9 10*3/uL — ABNORMAL HIGH (ref 1.7–7.7)
Neutrophils Relative %: 77 %
Platelets: 274 10*3/uL (ref 150–400)
RBC: 3.15 MIL/uL — ABNORMAL LOW (ref 4.22–5.81)
RDW: 13.8 % (ref 11.5–15.5)
WBC: 11.8 10*3/uL — ABNORMAL HIGH (ref 4.0–10.5)
nRBC: 0 % (ref 0.0–0.2)

## 2019-08-07 LAB — URINALYSIS, ROUTINE W REFLEX MICROSCOPIC
Bilirubin Urine: NEGATIVE
Glucose, UA: NEGATIVE mg/dL
Ketones, ur: NEGATIVE mg/dL
Nitrite: NEGATIVE
Protein, ur: 100 mg/dL — AB
Specific Gravity, Urine: 1.009 (ref 1.005–1.030)
Squamous Epithelial / HPF: NONE SEEN (ref 0–5)
WBC, UA: >50 WBC/hpf — ABNORMAL HIGH (ref 0–5)
pH: 6 (ref 5.0–8.0)

## 2019-08-07 LAB — APTT: aPTT: 32 seconds (ref 24–36)

## 2019-08-07 LAB — PROTIME-INR
INR: 1 (ref 0.8–1.2)
Prothrombin Time: 13.4 seconds (ref 11.4–15.2)

## 2019-08-07 LAB — HEMOGLOBIN A1C
Hgb A1c MFr Bld: 8 % — ABNORMAL HIGH (ref 4.8–5.6)
Mean Plasma Glucose: 182.9 mg/dL

## 2019-08-07 LAB — LACTIC ACID, PLASMA: Lactic Acid, Venous: 0.9 mmol/L (ref 0.5–1.9)

## 2019-08-07 MED ORDER — SODIUM CHLORIDE 0.9 % IV SOLN
1.0000 g | Freq: Once | INTRAVENOUS | Status: AC
  Administered 2019-08-07: 1 g via INTRAVENOUS
  Filled 2019-08-07: qty 10

## 2019-08-07 MED ORDER — VITAMIN D (ERGOCALCIFEROL) 1.25 MG (50000 UNIT) PO CAPS: 50000.0000 [IU] | ORAL_CAPSULE | ORAL | Status: DC

## 2019-08-07 MED ORDER — SODIUM CHLORIDE 0.9 % IV SOLN
1.0000 g | INTRAVENOUS | Status: DC
  Filled 2019-08-07 (×2): qty 10

## 2019-08-07 MED ORDER — SODIUM CHLORIDE 0.9 % IV BOLUS
500.0000 mL | Freq: Once | INTRAVENOUS | Status: AC
  Administered 2019-08-07: 500 mL via INTRAVENOUS

## 2019-08-07 MED ORDER — CARVEDILOL 3.125 MG PO TABS
3.1250 mg | ORAL_TABLET | Freq: Two times a day (BID) | ORAL | Status: DC
  Administered 2019-08-08 – 2019-08-10 (×5): 3.125 mg via ORAL
  Filled 2019-08-07 (×5): qty 1

## 2019-08-07 MED ORDER — ATORVASTATIN CALCIUM 20 MG PO TABS
10.0000 mg | ORAL_TABLET | Freq: Every day | ORAL | Status: DC
  Administered 2019-08-09 (×2): 10 mg via ORAL
  Filled 2019-08-07 (×2): qty 1

## 2019-08-07 MED ORDER — INSULIN ASPART 100 UNIT/ML ~~LOC~~ SOLN: 0.0000 [IU] | Freq: Every day | SUBCUTANEOUS | Status: DC

## 2019-08-07 MED ORDER — SODIUM BICARBONATE 650 MG PO TABS
650.0000 mg | ORAL_TABLET | Freq: Two times a day (BID) | ORAL | Status: DC
  Administered 2019-08-08 – 2019-08-10 (×5): 650 mg via ORAL
  Filled 2019-08-07 (×6): qty 1

## 2019-08-07 MED ORDER — HEPARIN SODIUM (PORCINE) 5000 UNIT/ML IJ SOLN
5000.0000 [IU] | Freq: Three times a day (TID) | INTRAMUSCULAR | Status: DC
  Administered 2019-08-08 – 2019-08-10 (×5): 5000 [IU] via SUBCUTANEOUS
  Filled 2019-08-07 (×6): qty 1

## 2019-08-07 MED ORDER — FOLIC ACID 1 MG PO TABS
1.0000 mg | ORAL_TABLET | Freq: Every day | ORAL | Status: DC
  Administered 2019-08-08 – 2019-08-10 (×3): 1 mg via ORAL
  Filled 2019-08-07 (×3): qty 1

## 2019-08-07 MED ORDER — IOHEXOL 240 MG/ML SOLN
50.0000 mL | Freq: Once | INTRAMUSCULAR | Status: AC | PRN
  Administered 2019-08-07: 22:00:00 50 mL via ORAL

## 2019-08-07 MED ORDER — PANTOPRAZOLE SODIUM 40 MG PO TBEC
40.0000 mg | DELAYED_RELEASE_TABLET | Freq: Two times a day (BID) | ORAL | Status: DC
  Administered 2019-08-08 – 2019-08-10 (×5): 40 mg via ORAL
  Filled 2019-08-07 (×5): qty 1

## 2019-08-07 MED ORDER — ASPIRIN EC 81 MG PO TBEC
81.0000 mg | DELAYED_RELEASE_TABLET | Freq: Every day | ORAL | Status: DC
  Filled 2019-08-07 (×2): qty 1

## 2019-08-07 MED ORDER — AMLODIPINE BESYLATE 10 MG PO TABS
10.0000 mg | ORAL_TABLET | Freq: Every day | ORAL | Status: DC
  Administered 2019-08-08 – 2019-08-10 (×3): 10 mg via ORAL
  Filled 2019-08-07 (×3): qty 1

## 2019-08-07 MED ORDER — ACETAMINOPHEN 325 MG PO TABS
650.0000 mg | ORAL_TABLET | Freq: Once | ORAL | Status: AC
  Administered 2019-08-07: 650 mg via ORAL
  Filled 2019-08-07: qty 2

## 2019-08-07 MED ORDER — CYANOCOBALAMIN 500 MCG PO TABS
500.0000 ug | ORAL_TABLET | Freq: Every day | ORAL | Status: DC
  Administered 2019-08-08 – 2019-08-10 (×3): 500 ug via ORAL
  Filled 2019-08-07 (×3): qty 1

## 2019-08-07 MED ORDER — TAMSULOSIN HCL 0.4 MG PO CAPS
0.4000 mg | ORAL_CAPSULE | Freq: Every day | ORAL | Status: DC
  Administered 2019-08-08 – 2019-08-10 (×3): 0.4 mg via ORAL
  Filled 2019-08-07 (×3): qty 1

## 2019-08-07 MED ORDER — INSULIN ASPART 100 UNIT/ML ~~LOC~~ SOLN
0.0000 [IU] | Freq: Three times a day (TID) | SUBCUTANEOUS | Status: DC
  Administered 2019-08-08: 3 [IU] via SUBCUTANEOUS
  Administered 2019-08-08 (×2): 2 [IU] via SUBCUTANEOUS
  Administered 2019-08-09: 1 [IU] via SUBCUTANEOUS
  Administered 2019-08-09: 2 [IU] via SUBCUTANEOUS
  Administered 2019-08-09: 18:00:00 1 [IU] via SUBCUTANEOUS
  Administered 2019-08-10: 3 [IU] via SUBCUTANEOUS
  Administered 2019-08-10: 09:00:00 1 [IU] via SUBCUTANEOUS
  Filled 2019-08-07 (×7): qty 1

## 2019-08-07 NOTE — ED Provider Notes (Signed)
[facility] Emergency Department Provider Note  ____________________________________________   I have reviewed the triage vital signs and the nursing notes. Where available I have reviewed prior notes and, if possible and indicated, outside hospital notes.   Patient seen and evaluated during the coronavirus epidemic during a time with low staffing  Patient seen for the symptoms described in the history of present illness. She was evaluated in the context of the global COVID-19 pandemic, which necessitated consideration that the patient might be at risk for infection with the SARS-CoV-2 virus that causes COVID-19. Institutional protocols and algorithms that pertain to the evaluation of patients at risk for COVID-19 are in a state of rapid change based on information released by regulatory bodies including the CDC and federal and state organizations. These policies and algorithms were followed during the patient's care in the ED.    HISTORY  Chief Complaint Fever (possible sepsis) and Altered Mental Status    HPI Jay Mathis is a 85 y.o. male who presents today complaining of feeling somewhat weak.  According to patient and family he has been feeling a little bit weaker last 2 days developed a fever today.  Does have history of CKD, iron deficiency anemia, CAD.  Denies chest pain shortness of breath or cough.  However, was noted to have a fever tonight.  Does have a history of urinary tract infections.  His urine looked different to the family and they were worried that he might be developing a UTI.  They wanted to send him in to be better evaluated here before he got "too sick like last time".  Patient denies dysuria.  Does have a chronic hernia into his right inguinal region and scrotum however this is been chronic and is not different for him over the last 7 years.      Past Medical History:  Diagnosis Date  . CAD (coronary artery disease)   . CKD (chronic  kidney disease) stage 4, GFR 15-29 ml/min (HCC)   . Diabetes mellitus without complication ([place])   . GERD (gastroesophageal reflux disease)   . Hyperlipidemia   . Hypertension   . Iron deficiency anemia 07/30/2017  . Nephrolithiasis     Patient Active Problem List   Diagnosis Date Noted  . Encounter for ESA (erythropoietin stimulating agent) anemia management 10/01/2018  . CKD (chronic kidney disease) stage 4, GFR 15-29 ml/min (HCC) 03/27/2018  . Anemia in stage 4 chronic kidney disease ([place]) 03/27/2018  . Iron deficiency anemia 07/30/2017  . CAD (coronary artery disease) 07/22/2017  . BPH (benign prostatic hyperplasia) 07/22/2017  . Sepsis ([place]) 08/04/2016  . UTI (lower urinary tract infection) 08/04/2016  . HLD (hyperlipidemia) 08/04/2016  . Diabetes ([place]) 08/04/2016  . GERD (gastroesophageal reflux disease) 08/04/2016  . Acute on chronic kidney failure ([place]) 08/04/2016    Past Surgical History:  Procedure Laterality Date  . APPENDECTOMY     60 plus years ago  . CORONARY ANGIOPLASTY WITH STENT PLACEMENT    . CORONARY ARTERY BYPASS GRAFT      Prior to Admission medications   Medication Sig Start Date End Date Taking? Authorizing Provider  amLODipine (NORVASC) 10 MG tablet Take 10 mg by mouth daily. 07/08/17   [provider]  aspirin EC 81 MG tablet Take 81 mg by mouth daily.    [provider]  atorvastatin (LIPITOR) 10 MG tablet Take 10 mg by mouth at bedtime.  07/31/16   [provider]  carvedilol (COREG) 3.125 MG tablet  Take 3.125 mg 2 (two) times daily by mouth. 10/07/17   [provider]  ferrous sulfate 325 (65 FE) MG tablet Take 325 mg by mouth 2 (two) times daily with a meal.     [provider]  folic acid (FOLVITE) 1 MG tablet Take 1 tablet (1 mg total) by mouth daily. 12/31/18   Earlie Server, MD  furosemide (LASIX) 20 MG tablet Take 20 mg by mouth daily. 07/16/16   [provider]  glimepiride (AMARYL) 2 MG tablet  Take 2 mg by mouth every evening.  07/31/16   [provider]  pantoprazole (PROTONIX) 40 MG tablet Take 40 mg by mouth 2 (two) times daily. 07/09/16   [provider]  sodium bicarbonate 650 MG tablet Take 650 mg by mouth 2 (two) times daily.    [provider]  tamsulosin (FLOMAX) 0.4 MG CAPS capsule Take 1 capsule by mouth daily. 06/10/17   [provider]  vitamin B-12 (CYANOCOBALAMIN) 500 MCG tablet Take 1 tablet (500 mcg total) by mouth daily. 06/27/18   Earlie Server, MD  Vitamin D, Ergocalciferol, (DRISDOL) 50000 units CAPS capsule Take 50,000 Units by mouth every 7 (seven) days.    [provider]    Allergies Sulfa antibiotics  Family History  Problem Relation Age of Onset  . Prostate cancer Father   . Prostate cancer Brother     Social History Social History   Tobacco Use  . Smoking status: Former Research scientist (life sciences)  . Smokeless tobacco: Never Used  . Tobacco comment: quit about 60 years ago 2019 dated  Substance Use Topics  . Alcohol use: No  . Drug use: No    Review of Systems Constitutional: No fever/chills Eyes: No visual changes. ENT: No sore throat. No stiff neck no neck pain Cardiovascular: Denies chest pain. Respiratory: Denies shortness of breath. Gastrointestinal:   no vomiting.  No diarrhea.  No constipation. Genitourinary: Negative for dysuria. Musculoskeletal: Negative lower extremity swelling Skin: Negative for rash. Neurological: Negative for severe headaches, focal weakness or numbness.   ____________________________________________   PHYSICAL EXAM:  VITAL SIGNS: ED Triage Vitals  Enc Vitals Group     BP 08/07/19 1800 (!) 155/65     Pulse Rate 08/07/19 1800 87     Resp 08/07/19 1803 (!) 24     Temp --      Temp src --      SpO2 08/07/19 1800 96 %     Weight 08/07/19 1818 169 lb 5 oz (76.8 kg)     Height 08/07/19 1818 5\' 11"  (1.803 m)     Head Circumference --      Peak Flow --      Pain Score --      Pain  Loc --      Pain Edu? --      Excl. in [place]? --     Constitutional: Alert and oriented name and place unsure of the exact date knows it is August however. Well appearing and in no acute distress. Eyes: Conjunctivae are normal Head: Atraumatic HEENT: No congestion/rhinnorhea. Mucous membranes are moist.  Oropharynx non-erythematous Neck:   Nontender with no meningismus, no masses, no stridor Cardiovascular: Normal rate, regular rhythm. Grossly normal heart sounds.  Good peripheral circulation. Respiratory: Normal respiratory effort.  No retractions. Lungs CTAB. Abdominal: Soft and nontender. No distention. No guarding no rebound Back:  There is no focal tenderness or step off.  there is no midline tenderness there are no lesions noted. there  is no CVA tenderness GU: Hernia noted into the right scrotal region, not red or markedly tender Musculoskeletal: No lower extremity tenderness, no upper extremity tenderness. No joint effusions, no DVT signs strong distal pulses no edema Neurologic:  Normal speech and language. No gross focal neurologic deficits are appreciated.  Skin:  Skin is warm, dry and intact. No rash noted. Psychiatric: Mood and affect are normal. Speech and behavior are normal.  ____________________________________________   LABS (all labs ordered are listed, but only abnormal results are displayed)  Labs Reviewed  COMPREHENSIVE METABOLIC PANEL - Abnormal; Notable for the following components:      Result Value   Sodium 130 (*)    Chloride 92 (*)    Glucose, Bld 198 (*)    BUN 50 (*)    Creatinine, Ser 3.01 (*)    Calcium 8.6 (*)    Albumin 3.3 (*)    GFR calc non Af Amer 17 (*)    GFR calc Af Amer 20 (*)    All other components within normal limits  CBC WITH DIFFERENTIAL/PLATELET - Abnormal; Notable for the following components:   WBC 11.8 (*)    RBC 3.15 (*)    Hemoglobin 8.8 (*)    HCT 27.0 (*)    Neutro Abs 9.0 (*)    Monocytes Absolute 1.4 (*)    All other  components within normal limits  CULTURE, BLOOD (ROUTINE X 2)  CULTURE, BLOOD (ROUTINE X 2)  URINE CULTURE  SARS CORONAVIRUS 2 (HOSPITAL ORDER, [place] [facility])  LACTIC ACID, PLASMA  APTT  PROTIME-INR  LACTIC ACID, PLASMA  URINALYSIS, ROUTINE W REFLEX MICROSCOPIC    Pertinent labs  results that were available during my care of the patient were reviewed by me and considered in my medical decision making (see chart for details). ____________________________________________  EKG  I personally interpreted any EKGs ordered by me or triage  ____________________________________________  RADIOLOGY  Pertinent labs & imaging results that were available during my care of the patient were reviewed by me and considered in my medical decision making (see chart for details). If possible, patient and/or family made aware of any abnormal findings.  Dg Chest Port 1 View  Result Date: 08/07/2019 CLINICAL DATA:  Fever EXAM: PORTABLE CHEST 1 VIEW COMPARISON:  None. FINDINGS: Mild cardiomegaly and CABG changes noted. Mild pulmonary vascular congestion is present. There is no evidence of focal airspace disease, pulmonary edema, suspicious pulmonary nodule/mass, pleural effusion, or pneumothorax. No acute bony abnormalities are identified. IMPRESSION: Mild cardiomegaly with mild pulmonary vascular congestion. Electronically Signed   By: Margarette Canada M.D.   On: 08/07/2019 18:33   ____________________________________________    PROCEDURES  Procedure(s) performed: None  Procedures  Critical Care performed: None  ____________________________________________   INITIAL IMPRESSION / ASSESSMENT AND PLAN / ED COURSE  Pertinent labs & imaging results that were available during my care of the patient were reviewed by me and considered in my medical decision making (see chart for details).   Patient here with a fever 102, coronavirus and urinary tract infection have been evaluated and  are pending at this time.  His creatinine is near its baseline we are giving him ginger IV fluid given his history, patient is in no acute distress vital signs are reassuring lactic is negative, does not appear to be septic.  We will continue to observe him pending further testing assistance for diagnostic direction    ____________________________________________   FINAL CLINICAL IMPRESSION(S) / ED DIAGNOSES  Final diagnoses:  None      This chart was dictated using voice recognition software.  Despite best efforts to proofread,  errors can occur which can change meaning.      Schuyler Amor, MD 08/07/19 [phone]

## 2019-08-07 NOTE — ED Notes (Signed)
ED TO INPATIENT HANDOFF REPORT  ED Nurse Name and Phone #: Antonieta Pert, RN [id number]  S Name/Age/Gender Jay Mathis 85 y.o. male Room/Bed: ED13A/ED13A  Code Status   Code Status: Full Code  Home/SNF/Other Home Patient oriented to: self and place Is this baseline? Yes   Triage Complete: Triage complete  Chief Complaint Poss UTI  Triage Note Pt arrived via ems from home after son noticed increased confusion. Ems reports fever of 102.8, hx of kidney infections and UTI's. Pt alert on arrival oriented to self, situation, and month. Unable to provide current year, verbal responses to questions slightly delayed. MD present for assessment.    Allergies Allergies  Allergen Reactions  . Sulfa Antibiotics Rash    Level of Care/Admitting Diagnosis ED Disposition    ED Disposition Condition [facility] Area: [place] [[id number]]  Level of Care: Med-Surg [16]  Covid Evaluation: Confirmed COVID Negative  Diagnosis: UTI (urinary tract infection) [id number]  Admitting Physician: Demetrios Loll [id number]  Attending Physician: Rufina Falco ACHIENG [id number]  Estimated length of stay: past midnight tomorrow  Certification:: I certify this patient will need inpatient services for at least 2 midnights  PT Class (Do Not Modify): Inpatient [101]  PT Acc Code (Do Not Modify): Private [1]       B Medical/Surgery History Past Medical History:  Diagnosis Date  . CAD (coronary artery disease)   . CKD (chronic kidney disease) stage 4, GFR 15-29 ml/min (HCC)   . Diabetes mellitus without complication ([place])   . GERD (gastroesophageal reflux disease)   . Hyperlipidemia   . Hypertension   . Iron deficiency anemia 07/30/2017  . Nephrolithiasis    Past Surgical History:  Procedure Laterality Date  . APPENDECTOMY     60 plus years ago  . CORONARY ANGIOPLASTY WITH STENT PLACEMENT    . CORONARY ARTERY BYPASS GRAFT       A IV  Location/Drains/Wounds Patient Lines/Drains/Airways Status   Active Line/Drains/Airways    Name:   Placement date:   Placement time:   Site:   Days:   Peripheral IV 08/07/19 Anterior;Right Forearm   08/07/19    1807    Forearm   less than 1   Peripheral IV 08/07/19 Left Hand   08/07/19    1825    Hand   less than 1   Peripheral IV 08/07/19 Left Forearm   08/07/19    1826    Forearm   less than 1          Intake/Output Last 24 hours  Intake/Output Summary (Last 24 hours) at 08/07/2019 2232 Last data filed at 08/07/2019 2029 Gross per 24 hour  Intake 500 ml  Output -  Net 500 ml    Labs/Imaging Results for orders placed or performed during the hospital encounter of 08/07/19 (from the past 48 hour(s))  Lactic acid, plasma     Status: None   Collection Time: 08/07/19  6:13 PM  Result Value Ref Range   Lactic Acid, Venous 0.9 0.5 - 1.9 mmol/L    Comment: Performed at [facility], [address] [place], [place] [address]  Comprehensive metabolic panel     Status: Abnormal   Collection Time: 08/07/19  6:13 PM  Result Value Ref Range   Sodium 130 (L) 135 - 145 mmol/L   Potassium 3.7 3.5 - 5.1 mmol/L   Chloride 92 (L) 98 - 111 mmol/L   CO2 24 22 - 32 mmol/L  Glucose, Bld 198 (H) 70 - 99 mg/dL   BUN 50 (H) 8 - 23 mg/dL   Creatinine, Ser 3.01 (H) 0.61 - 1.24 mg/dL   Calcium 8.6 (L) 8.9 - 10.3 mg/dL   Total Protein 7.1 6.5 - 8.1 g/dL   Albumin 3.3 (L) 3.5 - 5.0 g/dL   AST 19 15 - 41 U/L   ALT 17 0 - 44 U/L   Alkaline Phosphatase 98 38 - 126 U/L   Total Bilirubin 0.8 0.3 - 1.2 mg/dL   GFR calc non Af Amer 17 (L) >60 mL/min   GFR calc Af Amer 20 (L) >60 mL/min   Anion gap 14 5 - 15    Comment: Performed at [facility], [address] [place], [place] [address]  CBC WITH DIFFERENTIAL     Status: Abnormal   Collection Time: 08/07/19  6:13 PM  Result Value Ref Range   WBC 11.8 (H) 4.0 - 10.5 K/uL   RBC 3.15 (L) 4.22 - 5.81 MIL/uL   Hemoglobin 8.8 (L) 13.0  - 17.0 g/dL   HCT 27.0 (L) 39.0 - 52.0 %   MCV 85.7 80.0 - 100.0 fL   MCH 27.9 26.0 - 34.0 pg   MCHC 32.6 30.0 - 36.0 g/dL   RDW 13.8 11.5 - 15.5 %   Platelets 274 150 - 400 K/uL   nRBC 0.0 0.0 - 0.2 %   Neutrophils Relative % 77 %   Neutro Abs 9.0 (H) 1.7 - 7.7 K/uL   Lymphocytes Relative 10 %   Lymphs Abs 1.2 0.7 - 4.0 K/uL   Monocytes Relative 12 %   Monocytes Absolute 1.4 (H) 0.1 - 1.0 K/uL   Eosinophils Relative 1 %   Eosinophils Absolute 0.1 0.0 - 0.5 K/uL   Basophils Relative 0 %   Basophils Absolute 0.0 0.0 - 0.1 K/uL   Immature Granulocytes 0 %   Abs Immature Granulocytes 0.05 0.00 - 0.07 K/uL    Comment: Performed at [facility], [address] [place], [place] [address]  APTT     Status: None   Collection Time: 08/07/19  6:13 PM  Result Value Ref Range   aPTT 32 24 - 36 seconds    Comment: Performed at [facility], [address] [place], [place] [address]  Protime-INR     Status: None   Collection Time: 08/07/19  6:13 PM  Result Value Ref Range   Prothrombin Time 13.4 11.4 - 15.2 seconds   INR 1.0 0.8 - 1.2    Comment: (NOTE) INR goal varies based on device and disease states. Performed at [facility], [address] [place], [place] [address]   Urinalysis, Routine w reflex microscopic     Status: Abnormal   Collection Time: 08/07/19  6:35 PM  Result Value Ref Range   Color, Urine YELLOW (A) YELLOW   APPearance TURBID (A) CLEAR   Specific Gravity, Urine 1.009 1.005 - 1.030   pH 6.0 5.0 - 8.0   Glucose, UA NEGATIVE NEGATIVE mg/dL   Hgb urine dipstick SMALL (A) NEGATIVE   Bilirubin Urine NEGATIVE NEGATIVE   Ketones, ur NEGATIVE NEGATIVE mg/dL   Protein, ur 100 (A) NEGATIVE mg/dL   Nitrite NEGATIVE NEGATIVE   Leukocytes,Ua LARGE (A) NEGATIVE   RBC / HPF 11-20 0 - 5 RBC/hpf   WBC, UA >50 (H) 0 - 5 WBC/hpf   Bacteria, UA FEW (A) NONE SEEN   Squamous Epithelial / LPF NONE SEEN 0 - 5  WBC Clumps PRESENT     Comment:  Performed at [facility], [address] [place], [place] [address]  SARS Coronavirus 2 [facility] order, Performed in [facility] hospital lab) Nasopharyngeal Nasopharyngeal Swab     Status: None   Collection Time: 08/07/19  6:35 PM   Specimen: Nasopharyngeal Swab  Result Value Ref Range   SARS Coronavirus 2 NEGATIVE NEGATIVE    Comment: (NOTE) If result is NEGATIVE SARS-CoV-2 target nucleic acids are NOT DETECTED. The SARS-CoV-2 RNA is generally detectable in upper and lower  respiratory specimens during the acute phase of infection. The lowest  concentration of SARS-CoV-2 viral copies this assay can detect is 250  copies / mL. A negative result does not preclude SARS-CoV-2 infection  and should not be used as the sole basis for treatment or other  patient management decisions.  A negative result may occur with  improper specimen collection / handling, submission of specimen other  than nasopharyngeal swab, presence of viral mutation(s) within the  areas targeted by this assay, and inadequate number of viral copies  (<250 copies / mL). A negative result must be combined with clinical  observations, patient history, and epidemiological information. If result is POSITIVE SARS-CoV-2 target nucleic acids are DETECTED. The SARS-CoV-2 RNA is generally detectable in upper and lower  respiratory specimens dur ing the acute phase of infection.  Positive  results are indicative of active infection with SARS-CoV-2.  Clinical  correlation with patient history and other diagnostic information is  necessary to determine patient infection status.  Positive results do  not rule out bacterial infection or co-infection with other viruses. If result is PRESUMPTIVE POSTIVE SARS-CoV-2 nucleic acids MAY BE PRESENT.   A presumptive positive result was obtained on the submitted specimen  and confirmed on repeat testing.  While 2019 novel coronavirus  (SARS-CoV-2) nucleic acids may be present in  the submitted sample  additional confirmatory testing may be necessary for epidemiological  and / or clinical management purposes  to differentiate between  SARS-CoV-2 and other Sarbecovirus currently known to infect humans.  If clinically indicated additional testing with an alternate test  methodology [phone]) is advised. The SARS-CoV-2 RNA is generally  detectable in upper and lower respiratory sp ecimens during the acute  phase of infection. The expected result is Negative. Fact Sheet for Patients:  StrictlyIdeas.no Fact Sheet for Healthcare Providers: BankingDealers.co.za This test is not yet approved or cleared by the [place] FDA and has been authorized for detection and/or diagnosis of SARS-CoV-2 by FDA under an Emergency Use Authorization (EUA).  This EUA will remain in effect (meaning this test can be used) for the duration of the COVID-19 declaration under Section 564(b)(1) of the Act, 21 U.S.C. section 360bbb-3(b)(1), unless the authorization is terminated or revoked sooner. Performed at [facility], [address] [place], [place] [address]    Dg Chest Port 1 View  Result Date: 08/07/2019 CLINICAL DATA:  Fever EXAM: PORTABLE CHEST 1 VIEW COMPARISON:  None. FINDINGS: Mild cardiomegaly and CABG changes noted. Mild pulmonary vascular congestion is present. There is no evidence of focal airspace disease, pulmonary edema, suspicious pulmonary nodule/mass, pleural effusion, or pneumothorax. No acute bony abnormalities are identified. IMPRESSION: Mild cardiomegaly with mild pulmonary vascular congestion. Electronically Signed   By: Margarette Canada M.D.   On: 08/07/2019 18:33    Pending Labs Unresulted Labs (From admission, onward)    Start     Ordered   08/07/19 2120  Hemoglobin A1c  Once,  STAT    Comments: To assess prior glycemic control    08/07/19 2126   08/07/19 1813  Blood Culture (routine x 2)  BLOOD  CULTURE X 2,   STAT     08/07/19 1813   08/07/19 1813  Urine culture  ONCE - STAT,   STAT     08/07/19 1813          Vitals/Pain Today's Vitals   08/07/19 2115 08/07/19 2130 08/07/19 2146 08/07/19 2205  BP:  (!) 133/56  (!) 161/69  Pulse: 68 65  69  Resp: 14 14  18   Temp:    98.1 F (36.7 C)  TempSrc:    Oral  SpO2: 96% 96%  97%  Weight:      Height:      PainSc:   0-No pain     Isolation Precautions Airborne and Contact precautions  Medications Medications  aspirin EC tablet 81 mg (has no administration in time range)  amLODipine (NORVASC) tablet 10 mg (has no administration in time range)  atorvastatin (LIPITOR) tablet 10 mg (has no administration in time range)  carvedilol (COREG) tablet 3.125 mg (has no administration in time range)  pantoprazole (PROTONIX) EC tablet 40 mg (has no administration in time range)  sodium bicarbonate tablet 650 mg (has no administration in time range)  tamsulosin (FLOMAX) capsule 0.4 mg (has no administration in time range)  folic acid (FOLVITE) tablet 1 mg (has no administration in time range)  vitamin B-12 (CYANOCOBALAMIN) tablet 500 mcg (has no administration in time range)  Vitamin D (Ergocalciferol) (DRISDOL) capsule 50,000 Units (has no administration in time range)  heparin injection 5,000 Units (has no administration in time range)  insulin aspart (novoLOG) injection 0-9 Units (has no administration in time range)  insulin aspart (novoLOG) injection 0-5 Units (has no administration in time range)  cefTRIAXone (ROCEPHIN) 1 g in sodium chloride 0.9 % 100 mL IVPB (has no administration in time range)  acetaminophen (TYLENOL) tablet 650 mg (650 mg Oral Given 08/07/19 1833)  sodium chloride 0.9 % bolus 500 mL (0 mLs Intravenous Stopped 08/07/19 2029)  cefTRIAXone (ROCEPHIN) 1 g in sodium chloride 0.9 % 100 mL IVPB (0 g Intravenous Stopped 08/07/19 2059)  iohexol (OMNIPAQUE) 240 MG/ML injection 50 mL (50 mLs Oral Contrast Given 08/07/19  2136)    Mobility walks with device High fall risk   Focused Assessments Please see sepsis evaluation   R Recommendations: See Admitting Provider Note  Report given to:   Additional Notes: Pt has large inguinal hernia which causes increased pain w/ urination and position. Pt has been using a quad cane at home

## 2019-08-07 NOTE — ED Triage Notes (Signed)
Pt arrived via ems from home after son noticed increased confusion. Ems reports fever of 102.8, hx of kidney infections and UTI's. Pt alert on arrival oriented to self, situation, and month. Unable to provide current year, verbal responses to questions slightly delayed. MD present for assessment.

## 2019-08-07 NOTE — Telephone Encounter (Signed)
Patient son called asking to speak with Dr Tasia Catchings about his father. Please return his call [phone]

## 2019-08-07 NOTE — H&P (Signed)
[facility] [place] NAME: Jay Mathis    MR#:  [id number]  DATE OF BIRTH:  16-Oct-1924  DATE OF ADMISSION:  08/07/2019  PRIMARY CARE PHYSICIAN: [name], MD   REQUESTING/REFERRING PHYSICIAN: Charlotte Crumb, MD  CHIEF COMPLAINT:   Chief Complaint  Patient presents with  . Fever    possible sepsis  . Altered Mental Status    HISTORY OF PRESENT ILLNESS:   85 year old male with past medical history of CAD, CKD, diabetes mellitus, GERD, hyperlipidemia, hypertension, iron deficiency anemia, and right inguinal hernia presenting to the ED with altered mental status.  Patient is hard of hearing and not a good historian therefore history mostly obtained from patient's son who is at the bedside.  Per patient's son, patient has history of recurrent UTIs and usually presents with increased confusion, slurred speech and generalized weakness. Patient's son states that this is very similar presentation to UTIs that he has had in the past therefore called EMS.  EMS reports fever of 102.8.  On arrival to the ED, he was febrile with blood pressure 156/68 mm Hg and pulse rate 81 beats/min. There were no focal neurological deficits; he was alert and oriented x 2.  Initial labs revealed lactic 0.9, sodium 130, BUN 50, creatinine 3.01, WBC 11.8, hemoglobin 8.8.  UA shows UTI.  Chest x-ray shows mild cardiomegaly with mild pulmonary vascular congestion.  PAST MEDICAL HISTORY:   Past Medical History:  Diagnosis Date  . CAD (coronary artery disease)   . CKD (chronic kidney disease) stage 4, GFR 15-29 ml/min (HCC)   . Diabetes mellitus without complication ([place])   . GERD (gastroesophageal reflux disease)   . Hyperlipidemia   . Hypertension   . Iron deficiency anemia 07/30/2017  . Nephrolithiasis     PAST SURGICAL HISTORY:   Past Surgical History:  Procedure Laterality Date  . APPENDECTOMY     60 plus years ago  . CORONARY ANGIOPLASTY WITH  STENT PLACEMENT    . CORONARY ARTERY BYPASS GRAFT      SOCIAL HISTORY:   Social History   Tobacco Use  . Smoking status: Former Research scientist (life sciences)  . Smokeless tobacco: Never Used  . Tobacco comment: quit about 60 years ago 2019 dated  Substance Use Topics  . Alcohol use: No    FAMILY HISTORY:   Family History  Problem Relation Age of Onset  . Prostate cancer Father   . Prostate cancer Brother     DRUG ALLERGIES:   Allergies  Allergen Reactions  . Sulfa Antibiotics Rash    REVIEW OF SYSTEMS:   ROS Unable to obtain from patient due to altered mental status. MEDICATIONS AT HOME:   Prior to Admission medications   Medication Sig Start Date End Date Taking? Authorizing Provider  amLODipine (NORVASC) 10 MG tablet Take 10 mg by mouth daily. 07/08/17  Yes [provider]  aspirin EC 81 MG tablet Take 81 mg by mouth daily.   Yes [provider]  atorvastatin (LIPITOR) 10 MG tablet Take 10 mg by mouth at bedtime.  07/31/16  Yes [provider]  carvedilol (COREG) 3.125 MG tablet Take 3.125 mg 2 (two) times daily by mouth. 10/07/17  Yes [provider]  folic acid (FOLVITE) 1 MG tablet Take 1 tablet (1 mg total) by mouth daily. 12/31/18  Yes Earlie Server, MD  furosemide (LASIX) 40 MG tablet Take 40 mg by mouth daily.  07/16/16  Yes [provider]  glimepiride (  AMARYL) 2 MG tablet Take 2 mg by mouth every morning.  07/31/16  Yes [provider]  pantoprazole (PROTONIX) 40 MG tablet Take 40 mg by mouth 2 (two) times daily. 07/09/16  Yes [provider]  sodium bicarbonate 650 MG tablet Take 650 mg by mouth 2 (two) times daily.   Yes [provider]  tamsulosin (FLOMAX) 0.4 MG CAPS capsule Take 1 capsule by mouth daily. 06/10/17  Yes [provider]  vitamin B-12 (CYANOCOBALAMIN) 500 MCG tablet Take 1 tablet (500 mcg total) by mouth daily. 06/27/18  Yes Earlie Server, MD  Vitamin D, Ergocalciferol, (DRISDOL) 50000 units CAPS  capsule Take 50,000 Units by mouth every 7 (seven) days.   Yes [provider]      VITAL SIGNS:  Blood pressure (!) 133/56, pulse 65, resp. rate 14, height 5\' 11"  (1.803 m), weight 76.8 kg, SpO2 96 %.  PHYSICAL EXAMINATION:   Physical Exam  GENERAL:  85 y.o.-year-old patient lying in the bed with no acute distress.  EYES: Pupils equal, round, reactive to light and accommodation. No scleral icterus. Extraocular muscles intact.  HEENT: Head atraumatic, normocephalic. Oropharynx and nasopharynx clear.  NECK:  Supple, no jugular venous distention. No thyroid enlargement, no tenderness.  LUNGS: Normal breath sounds bilaterally, no wheezing, rales,rhonchi or crepitation. No use of accessory muscles of respiration.  CARDIOVASCULAR: S1, S2 normal. No murmurs, rubs, or gallops.  ABDOMEN: Soft, nontender, nondistended. Bowel sounds present.  Large right inguinal hernia EXTREMITIES: No pedal edema, cyanosis, or clubbing. No rash or lesions. + pedal pulses MUSCULOSKELETAL: Normal bulk, and power was 5+ grip and elbow, knee, and ankle flexion and extension bilaterally.  NEUROLOGIC:Alert and oriented x 3. CN 2-12 intact. Sensation to light touch and cold stimuli intact bilaterally. Gait not tested due to safety concern. PSYCHIATRIC: The patient is alert and oriented x 3.  SKIN: No obvious rash, lesion, or ulcer.   DATA REVIEWED:  LABORATORY PANEL:   CBC Recent Labs  Lab 08/07/19 1813  WBC 11.8*  HGB 8.8*  HCT 27.0*  PLT 274   ------------------------------------------------------------------------------------------------------------------  Chemistries  Recent Labs  Lab 08/07/19 1813  NA 130*  K 3.7  CL 92*  CO2 24  GLUCOSE 198*  BUN 50*  CREATININE 3.01*  CALCIUM 8.6*  AST 19  ALT 17  ALKPHOS 98  BILITOT 0.8   ------------------------------------------------------------------------------------------------------------------  Cardiac Enzymes No results for  input(s): TROPONINI in the last 168 hours. ------------------------------------------------------------------------------------------------------------------  RADIOLOGY:  Dg Chest Port 1 View  Result Date: 08/07/2019 CLINICAL DATA:  Fever EXAM: PORTABLE CHEST 1 VIEW COMPARISON:  None. FINDINGS: Mild cardiomegaly and CABG changes noted. Mild pulmonary vascular congestion is present. There is no evidence of focal airspace disease, pulmonary edema, suspicious pulmonary nodule/mass, pleural effusion, or pneumothorax. No acute bony abnormalities are identified. IMPRESSION: Mild cardiomegaly with mild pulmonary vascular congestion. Electronically Signed   By: Margarette Canada M.D.   On: 08/07/2019 18:33   EKG:  EKG: normal EKG, normal sinus rhythm, unchanged from previous tracings. Vent. rate 83 BPM PR interval * ms QRS duration 81 ms QT/QTc 369/434 ms P-R-T axes 19 -32 37 IMPRESSION AND PLAN:   85 y.o. male with past medical history of CAD, CKD, diabetes mellitus, GERD, hyperlipidemia, hypertension, iron deficiency anemia, and right inguinal hernia presenting to the ED with altered mental status.  1. Altered mental status -likely due to UTI - Admit to MedSurg unit - Mental status improving - Cultures pending - Chest x-ray shows mild pulmonary vascular  congestion - Continue to monitor  2. Urinary tract infection - UA shows UTI - Urine cultures pending - Empiric antibiotics with ceftriaxone - Urology consult given patient with history of right inguinal hernia with recurrent UTIs.  3. Right inguinal hernia -appears to be enlarging - Previously evaluated by general surgery Dr. Dahlia Byes, no surgical intervention recommended - We will obtain CT abdomen and pelvis  4. Acute on chronic kidney disease - BUN/creatinine slightly elevated above baseline - Hold nephrotoxins - Continue to monitor renal function  5. Coronary Artery Disease - ASA 81mg  PO daily - HTN, HLD, DM control as below  5. HLD   + Goal LDL<100 - Atorvastatin 10mg  PO qhs  6. HTN = + Goal BP <130/80 -Continue Coreg, amlodipine  7. Diabetes Mellitus Type 2 with complications - Hold glimepiride - CBG monitoring - SSI - DM educator to see  8. Anemia of chronic disease -Hemoglobin 8.8 - Following with rheumatology oncology Dr. Tasia Catchings  9. DVT prophylaxis - Heparin SubQ   All the records are reviewed and case discussed with ED provider. Management plans discussed with the patient, family and they are in agreement.  CODE STATUS: FULL  TOTAL TIME TAKING CARE OF THIS PATIENT: 50 minutes.    on 08/07/2019 at 9:44 PM   Rufina Falco, DNP, FNP-BC [facility] Nurse Practitioner Between 7am to 6pm - Pager [phone]  After 6pm go to [email or web address] - password [email or web address] [facility]  Office  [phone]  CC: Primary care physician; [name], MD

## 2019-08-08 ENCOUNTER — Encounter: Payer: Self-pay | Admitting: *Deleted

## 2019-08-08 DIAGNOSIS — N390 Urinary tract infection, site not specified: Principal | ICD-10-CM

## 2019-08-08 DIAGNOSIS — K4090 Unilateral inguinal hernia, without obstruction or gangrene, not specified as recurrent: Secondary | ICD-10-CM

## 2019-08-08 LAB — BASIC METABOLIC PANEL
Anion gap: 13 (ref 5–15)
BUN: 46 mg/dL — ABNORMAL HIGH (ref 8–23)
CO2: 25 mmol/L (ref 22–32)
Calcium: 8.7 mg/dL — ABNORMAL LOW (ref 8.9–10.3)
Chloride: 95 mmol/L — ABNORMAL LOW (ref 98–111)
Creatinine, Ser: 2.75 mg/dL — ABNORMAL HIGH (ref 0.61–1.24)
GFR calc Af Amer: 22 mL/min — ABNORMAL LOW (ref >60)
GFR calc non Af Amer: 19 mL/min — ABNORMAL LOW (ref >60)
Glucose, Bld: 164 mg/dL — ABNORMAL HIGH (ref 70–99)
Potassium: 3.5 mmol/L (ref 3.5–5.1)
Sodium: 133 mmol/L — ABNORMAL LOW (ref 135–145)

## 2019-08-08 LAB — CBC
HCT: 29.5 % — ABNORMAL LOW (ref 39.0–52.0)
Hemoglobin: 9.9 g/dL — ABNORMAL LOW (ref 13.0–17.0)
MCH: 28.2 pg (ref 26.0–34.0)
MCHC: 33.6 g/dL (ref 30.0–36.0)
MCV: 84 fL (ref 80.0–100.0)
Platelets: 307 10*3/uL (ref 150–400)
RBC: 3.51 MIL/uL — ABNORMAL LOW (ref 4.22–5.81)
RDW: 13.7 % (ref 11.5–15.5)
WBC: 11.4 10*3/uL — ABNORMAL HIGH (ref 4.0–10.5)
nRBC: 0 % (ref 0.0–0.2)

## 2019-08-08 LAB — GLUCOSE, CAPILLARY
Glucose-Capillary: 157 mg/dL — ABNORMAL HIGH (ref 70–99)
Glucose-Capillary: 176 mg/dL — ABNORMAL HIGH (ref 70–99)
Glucose-Capillary: 176 mg/dL — ABNORMAL HIGH (ref 70–99)
Glucose-Capillary: 184 mg/dL — ABNORMAL HIGH (ref 70–99)
Glucose-Capillary: 216 mg/dL — ABNORMAL HIGH (ref 70–99)

## 2019-08-08 LAB — MAGNESIUM: Magnesium: 1.8 mg/dL (ref 1.7–2.4)

## 2019-08-08 MED ORDER — ACETAMINOPHEN 325 MG PO TABS
650.0000 mg | ORAL_TABLET | ORAL | Status: DC | PRN
  Administered 2019-08-08: 650 mg via ORAL

## 2019-08-08 NOTE — Progress Notes (Signed)
Patient c/o of right leg pain, received verbal order from Dr. Sidney Ace for prn tylenol 650mg  Q4H.

## 2019-08-08 NOTE — Plan of Care (Signed)

## 2019-08-08 NOTE — Consult Note (Signed)
Reason for Consult:Right inguinal hernia  Referring Physician: Otila Back, MD (hospitalist)  Jay Mathis is an 85 y.o. male.  HPI: Jay Mathis is a 85 y.o. male who presented to the emergency department last nigh complaining of feeling somewhat weak.  According to patient and family he has been feeling a little bit weaker last 2 days developed a fever today.  Does have history of CKD, iron deficiency anemia, CAD.  Denies chest pain shortness of breath or cough.  However, was noted to have a fever tonight.  Does have a history of urinary tract infections.  His urine looked different to the family and they were worried that he might be developing a UTI.  They wanted to send him in to be better evaluated here before he got "too sick like last time".  Patient denies dysuria.  Does have a chronic hernia into his right inguinal region and scrotum however this is been chronic and is not different for him over the last 7 years. A CT scam was performed that confirms the presence of small bowel loops within the hernia and into the patient's scrotum.  No concern for bowel compromise.  General surgery is consulted by Dr. Stark Jock for further evaluation.  [name] confirms that the hernia has been present for at least 7 to 8 years.  He admits that it has enlarged a bit during that time, and that it is somewhat uncomfortable.  He reports having been shown in the past how to reduce it himself, and he is still able to do so.  His son states that they had been using a truss in the past, but that it was not particularly effective.  He has not used any sort of scrotal support, such as a jockstrap.  He reports some burning with urination, but denies any nausea, vomiting or inability to move his bowels.  He was seen by general surgery (Dr. Dahlia Byes) in 2018.  At that time, he was thrombocytopenic and anemic.  He remains somewhat anemic, but his thrombocytopenia has resolved; the patient states he had been on aspirin at the  time and no longer takes it.  Past Medical History:  Diagnosis Date   CAD (coronary artery disease)    CKD (chronic kidney disease) stage 4, GFR 15-29 ml/min (HCC)    Diabetes mellitus without complication (HCC)    GERD (gastroesophageal reflux disease)    Hyperlipidemia    Hypertension    Iron deficiency anemia 07/30/2017   Nephrolithiasis     Past Surgical History:  Procedure Laterality Date   APPENDECTOMY     60 plus years ago   CORONARY ANGIOPLASTY WITH STENT PLACEMENT     CORONARY ARTERY BYPASS GRAFT      Family History  Problem Relation Age of Onset   Prostate cancer Father    Prostate cancer Brother     Social History:  reports that he has quit smoking. He has never used smokeless tobacco. He reports that he does not drink alcohol or use drugs.  Allergies:  Allergies  Allergen Reactions   Sulfa Antibiotics Rash    Medications: I have reviewed the patient's current medications.  Results for orders placed or performed during the hospital encounter of 08/07/19 (from the past 48 hour(s))  Lactic acid, plasma     Status: None   Collection Time: 08/07/19  6:13 PM  Result Value Ref Range   Lactic Acid, Venous 0.9 0.5 - 1.9 mmol/L    Comment: Performed at [facility]  [facility], [address] [place], [place] [address]  Comprehensive metabolic panel     Status: Abnormal   Collection Time: 08/07/19  6:13 PM  Result Value Ref Range   Sodium 130 (L) 135 - 145 mmol/L   Potassium 3.7 3.5 - 5.1 mmol/L   Chloride 92 (L) 98 - 111 mmol/L   CO2 24 22 - 32 mmol/L   Glucose, Bld 198 (H) 70 - 99 mg/dL   BUN 50 (H) 8 - 23 mg/dL   Creatinine, Ser 3.01 (H) 0.61 - 1.24 mg/dL   Calcium 8.6 (L) 8.9 - 10.3 mg/dL   Total Protein 7.1 6.5 - 8.1 g/dL   Albumin 3.3 (L) 3.5 - 5.0 g/dL   AST 19 15 - 41 U/L   ALT 17 0 - 44 U/L   Alkaline Phosphatase 98 38 - 126 U/L   Total Bilirubin 0.8 0.3 - 1.2 mg/dL   GFR calc non Af Amer 17 (L) >60 mL/min   GFR calc Af Amer 20 (L)  >60 mL/min   Anion gap 14 5 - 15    Comment: Performed at [facility], [address] [place], [place] [address]  CBC WITH DIFFERENTIAL     Status: Abnormal   Collection Time: 08/07/19  6:13 PM  Result Value Ref Range   WBC 11.8 (H) 4.0 - 10.5 K/uL   RBC 3.15 (L) 4.22 - 5.81 MIL/uL   Hemoglobin 8.8 (L) 13.0 - 17.0 g/dL   HCT 27.0 (L) 39.0 - 52.0 %   MCV 85.7 80.0 - 100.0 fL   MCH 27.9 26.0 - 34.0 pg   MCHC 32.6 30.0 - 36.0 g/dL   RDW 13.8 11.5 - 15.5 %   Platelets 274 150 - 400 K/uL   nRBC 0.0 0.0 - 0.2 %   Neutrophils Relative % 77 %   Neutro Abs 9.0 (H) 1.7 - 7.7 K/uL   Lymphocytes Relative 10 %   Lymphs Abs 1.2 0.7 - 4.0 K/uL   Monocytes Relative 12 %   Monocytes Absolute 1.4 (H) 0.1 - 1.0 K/uL   Eosinophils Relative 1 %   Eosinophils Absolute 0.1 0.0 - 0.5 K/uL   Basophils Relative 0 %   Basophils Absolute 0.0 0.0 - 0.1 K/uL   Immature Granulocytes 0 %   Abs Immature Granulocytes 0.05 0.00 - 0.07 K/uL    Comment: Performed at [facility], [address] [place], [place] [address]  APTT     Status: None   Collection Time: 08/07/19  6:13 PM  Result Value Ref Range   aPTT 32 24 - 36 seconds    Comment: Performed at [facility], [address] [place], [place] [address]  Protime-INR     Status: None   Collection Time: 08/07/19  6:13 PM  Result Value Ref Range   Prothrombin Time 13.4 11.4 - 15.2 seconds   INR 1.0 0.8 - 1.2    Comment: (NOTE) INR goal varies based on device and disease states. Performed at [facility], [address] [place], [place] [address]   Blood Culture (routine x 2)     Status: None (Preliminary result)   Collection Time: 08/07/19  6:13 PM   Specimen: BLOOD  Result Value Ref Range   Specimen Description BLOOD R FOREARM    Special Requests      BOTTLES DRAWN AEROBIC AND ANAEROBIC Blood Culture results may not be optimal due to an excessive volume of blood received in culture bottles  Culture       NO GROWTH < 12 HOURS Performed at [facility], [address] [place], [place] [address]    Report Status PENDING   Blood Culture (routine x 2)     Status: None (Preliminary result)   Collection Time: 08/07/19  6:13 PM   Specimen: BLOOD  Result Value Ref Range   Specimen Description BLOOD L FOREARM    Special Requests      BOTTLES DRAWN AEROBIC AND ANAEROBIC Blood Culture results may not be optimal due to an excessive volume of blood received in culture bottles   Culture      NO GROWTH < 12 HOURS Performed at [facility], [address] [place], [place] [address]    Report Status PENDING   Hemoglobin A1c     Status: Abnormal   Collection Time: 08/07/19  6:13 PM  Result Value Ref Range   Hgb A1c MFr Bld 8.0 (H) 4.8 - 5.6 %    Comment: (NOTE) Pre diabetes:          5.7%-6.4% Diabetes:              >6.4% Glycemic control for   <7.0% adults with diabetes    Mean Plasma Glucose 182.9 mg/dL    Comment: Performed at [facility], [place] [address] [place], [place] [address]  Urinalysis, Routine w reflex microscopic     Status: Abnormal   Collection Time: 08/07/19  6:35 PM  Result Value Ref Range   Color, Urine YELLOW (A) YELLOW   APPearance TURBID (A) CLEAR   Specific Gravity, Urine 1.009 1.005 - 1.030   pH 6.0 5.0 - 8.0   Glucose, UA NEGATIVE NEGATIVE mg/dL   Hgb urine dipstick SMALL (A) NEGATIVE   Bilirubin Urine NEGATIVE NEGATIVE   Ketones, ur NEGATIVE NEGATIVE mg/dL   Protein, ur 100 (A) NEGATIVE mg/dL   Nitrite NEGATIVE NEGATIVE   Leukocytes,Ua LARGE (A) NEGATIVE   RBC / HPF 11-20 0 - 5 RBC/hpf   WBC, UA >50 (H) 0 - 5 WBC/hpf   Bacteria, UA FEW (A) NONE SEEN   Squamous Epithelial / LPF NONE SEEN 0 - 5   WBC Clumps PRESENT     Comment: Performed at [facility], [address] [place], [place] [address]  SARS Coronavirus 2 [facility] order, Performed in [facility] hospital lab) Nasopharyngeal Nasopharyngeal Swab     Status: None    Collection Time: 08/07/19  6:35 PM   Specimen: Nasopharyngeal Swab  Result Value Ref Range   SARS Coronavirus 2 NEGATIVE NEGATIVE    Comment: (NOTE) If result is NEGATIVE SARS-CoV-2 target nucleic acids are NOT DETECTED. The SARS-CoV-2 RNA is generally detectable in upper and lower  respiratory specimens during the acute phase of infection. The lowest  concentration of SARS-CoV-2 viral copies this assay can detect is 250  copies / mL. A negative result does not preclude SARS-CoV-2 infection  and should not be used as the sole basis for treatment or other  patient management decisions.  A negative result may occur with  improper specimen collection / handling, submission of specimen other  than nasopharyngeal swab, presence of viral mutation(s) within the  areas targeted by this assay, and inadequate number of viral copies  (<250 copies / mL). A negative result must be combined with clinical  observations, patient history, and epidemiological information. If result is POSITIVE SARS-CoV-2 target nucleic acids are DETECTED. The SARS-CoV-2 RNA is generally detectable in upper and  lower  respiratory specimens dur ing the acute phase of infection.  Positive  results are indicative of active infection with SARS-CoV-2.  Clinical  correlation with patient history and other diagnostic information is  necessary to determine patient infection status.  Positive results do  not rule out bacterial infection or co-infection with other viruses. If result is PRESUMPTIVE POSTIVE SARS-CoV-2 nucleic acids MAY BE PRESENT.   A presumptive positive result was obtained on the submitted specimen  and confirmed on repeat testing.  While 2019 novel coronavirus  (SARS-CoV-2) nucleic acids may be present in the submitted sample  additional confirmatory testing may be necessary for epidemiological  and / or clinical management purposes  to differentiate between  SARS-CoV-2 and other Sarbecovirus currently known  to infect humans.  If clinically indicated additional testing with an alternate test  methodology [phone]) is advised. The SARS-CoV-2 RNA is generally  detectable in upper and lower respiratory sp ecimens during the acute  phase of infection. The expected result is Negative. Fact Sheet for Patients:  StrictlyIdeas.no Fact Sheet for Healthcare Providers: BankingDealers.co.za This test is not yet approved or cleared by the [place] FDA and has been authorized for detection and/or diagnosis of SARS-CoV-2 by FDA under an Emergency Use Authorization (EUA).  This EUA will remain in effect (meaning this test can be used) for the duration of the COVID-19 declaration under Section 564(b)(1) of the Act, 21 U.S.C. section 360bbb-3(b)(1), unless the authorization is terminated or revoked sooner. Performed at [facility], [address] [place], [place] [address]   Glucose, capillary     Status: Abnormal   Collection Time: 08/08/19 12:52 AM  Result Value Ref Range   Glucose-Capillary 176 (H) 70 - 99 mg/dL  Glucose, capillary     Status: Abnormal   Collection Time: 08/08/19  7:53 AM  Result Value Ref Range   Glucose-Capillary 176 (H) 70 - 99 mg/dL   Comment 1 Notify RN   CBC     Status: Abnormal   Collection Time: 08/08/19  8:32 AM  Result Value Ref Range   WBC 11.4 (H) 4.0 - 10.5 K/uL   RBC 3.51 (L) 4.22 - 5.81 MIL/uL   Hemoglobin 9.9 (L) 13.0 - 17.0 g/dL   HCT 29.5 (L) 39.0 - 52.0 %   MCV 84.0 80.0 - 100.0 fL   MCH 28.2 26.0 - 34.0 pg   MCHC 33.6 30.0 - 36.0 g/dL   RDW 13.7 11.5 - 15.5 %   Platelets 307 150 - 400 K/uL   nRBC 0.0 0.0 - 0.2 %    Comment: Performed at [facility], [address] [place], [place] [id number]  Basic metabolic panel     Status: Abnormal   Collection Time: 08/08/19  8:32 AM  Result Value Ref Range   Sodium 133 (L) 135 - 145 mmol/L   Potassium 3.5 3.5 - 5.1 mmol/L   Chloride 95 (L)  98 - 111 mmol/L   CO2 25 22 - 32 mmol/L   Glucose, Bld 164 (H) 70 - 99 mg/dL   BUN 46 (H) 8 - 23 mg/dL   Creatinine, Ser 2.75 (H) 0.61 - 1.24 mg/dL   Calcium 8.7 (L) 8.9 - 10.3 mg/dL   GFR calc non Af Amer 19 (L) >60 mL/min   GFR calc Af Amer 22 (L) >60 mL/min   Anion gap 13 5 - 15    Comment: Performed at [facility], [address], [place], [place] [address]  Magnesium  Status: None   Collection Time: 08/08/19  8:32 AM  Result Value Ref Range   Magnesium 1.8 1.7 - 2.4 mg/dL    Comment: Performed at [facility], [address] [place], [place] [address]  Glucose, capillary     Status: Abnormal   Collection Time: 08/08/19 11:58 AM  Result Value Ref Range   Glucose-Capillary 157 (H) 70 - 99 mg/dL   Comment 1 Notify RN     Ct Abdomen Pelvis Wo Contrast  Result Date: 08/07/2019 CLINICAL DATA:  Fever, confusion.  History of kidney infections. EXAM: CT ABDOMEN AND PELVIS WITHOUT CONTRAST TECHNIQUE: Multidetector CT imaging of the abdomen and pelvis was performed following the standard protocol without IV contrast. COMPARISON:  06/10/2017 FINDINGS: Lower chest: No acute abnormality Hepatobiliary: No focal hepatic abnormality. Gallbladder unremarkable. Pancreas: No focal abnormality or ductal dilatation. Spleen: No focal abnormality.  Normal size. Adrenals/Urinary Tract: Severe bilateral hydronephrosis and hydroureter. Markedly thick walled, trabeculated bladder with numerous diverticula. Cortical thinning within the kidneys bilaterally and bilateral perinephric stranding. Adrenal glands unremarkable. Stomach/Bowel: Sigmoid diverticulosis. No active diverticulitis. Stomach, large and small bowel grossly unremarkable. Large right inguinal hernia containing numerous small bowel loops. No evidence of bowel obstruction. Vascular/Lymphatic: No evidence of aneurysm or adenopathy. Reproductive: Prostate enlargement. Other: No free fluid or free air. Musculoskeletal: No acute bony  abnormality. IMPRESSION: Severe bilateral hydronephrosis and hydroureter with markedly thick walled, trabeculated bladder. Prostate enlargement. Sigmoid diverticulosis.  No active diverticulitis. Large right inguinal hernia containing several small bowel loops. No evidence of bowel obstruction. Aortic atherosclerosis.  In Electronically Signed   By: Rolm Baptise M.D.   On: 08/07/2019 23:40   Dg Chest Port 1 View  Result Date: 08/07/2019 CLINICAL DATA:  Fever EXAM: PORTABLE CHEST 1 VIEW COMPARISON:  None. FINDINGS: Mild cardiomegaly and CABG changes noted. Mild pulmonary vascular congestion is present. There is no evidence of focal airspace disease, pulmonary edema, suspicious pulmonary nodule/mass, pleural effusion, or pneumothorax. No acute bony abnormalities are identified. IMPRESSION: Mild cardiomegaly with mild pulmonary vascular congestion. Electronically Signed   By: Margarette Canada M.D.   On: 08/07/2019 18:33   I personally reviewed the imaging studies and agree with the findings reported by the radiologist.  Review of Systems  Constitutional: Positive for fever and malaise/fatigue.  HENT: Positive for hearing loss.   Genitourinary: Positive for dysuria.  Neurological: Positive for weakness.  All other systems reviewed and are negative.  Blood pressure (!) 169/74, pulse 93, temperature 98.4 F (36.9 C), resp. rate 19, height 5\' 11"  (1.803 m), weight 76.8 kg, SpO2 96 %. Physical Exam  Constitutional: He is cooperative.  Non-toxic appearance.  Thin, elderly Caucasian male, seated in his hospital bed eating lunch.  His son is at bedside.  HENT:  Right Ear: Decreased hearing is noted.  Left Ear: Decreased hearing is noted.  Hearing aids in place.  Eyes: Right eye exhibits no discharge. Left eye exhibits no discharge. No scleral icterus.  Neck: No tracheal deviation present.  Cardiovascular: Normal rate and regular rhythm.  Respiratory: Effort normal. No stridor. No respiratory distress.    GI: Soft. He exhibits no distension. There is no abdominal tenderness. A hernia is present. Hernia confirmed positive in the right inguinal area.  Genitourinary:       Genitourinary Comments: Large right inguinal hernia with intestines palpable with in the scrotal sac.  Mildly tender but soft.  No erythema.  With some effort, the majority of the hernia can be reduced, however this was  uncomfortable for the patient and so I did not completely reduce it.   Musculoskeletal:        General: No deformity or edema.  Neurological: He is alert.  Skin: Skin is warm and dry.  Psychiatric: He has a normal mood and affect. His behavior is normal.    Assessment/Plan: This is an elderly white male with multiple medical comorbidities, admitted to the hospitalist service with possible sepsis, likely urinary in origin.  He has had a large right inguinal hernia for at least 7 to 8 years.  At this time, the bowel is not compromised and there is no need for urgent surgical evaluation.  The patient does endorse a fair amount of discomfort from the hernia.  For now, I recommend scrotal support and analgesics.  Prior to surgical repair, if we determined that is the best option for him, he will require medical clearance and potentially cardiac clearance given his history of coronary artery disease.  As mesh will be required for this repair, there should be no persistent infection to avoid seeding the mesh.  Please have him follow-up in my clinic upon his discharge from the hospital.  Fredirick Maudlin 08/08/2019, 1:47 PM

## 2019-08-08 NOTE — Progress Notes (Signed)
Patient stated he would like to be a DNR. Discussed with Dr. Sidney Ace, received verbal order to change the patient's code status.

## 2019-08-08 NOTE — Consult Note (Signed)
08/08/19 9:15 PM   Jay Mathis 09-29-1924 [id number]  Reason for consult: UTI, bilateral hydronephrosis  HPI: I was asked to see Jay Mathis in consultation for UTI and bilateral hydroureteronephrosis with a distended bladder from Dr. Stark Jock.  He is a 85 year old male with past medical history notable for CAD status post CABG and stage IV CKD with baseline creatinine 2.8, eGFR of 18 that was admitted yesterday 08/07/2019 with fevers and confusion, and found to have urinary tract infection.  CT performed in the ED showed bilateral hydroureteronephrosis down to a distended bladder with some bladder diverticulum.  Urology was not contacted regarding consult request until this evening.  The patient is a poor historian, but he reports a long history of difficulty urinating.  He has been on Flomax chronically.  He denies any history of gross hematuria.  He does report some right-sided flank pain.  On chart review, bilateral hydroureteronephrosis and distended bladder has been present since at least CT of 08/04/2016.  On my review of the records, does not appear this was ever worked up, and he has never been seen by urology that I can find.  There are no aggravating or alleviating factors.  Severity is severe.  Urine culture is still pending.   PMH: Past Medical History:  Diagnosis Date  . CAD (coronary artery disease)   . CKD (chronic kidney disease) stage 4, GFR 15-29 ml/min (HCC)   . Diabetes mellitus without complication ([facility])   . GERD (gastroesophageal reflux disease)   . Hyperlipidemia   . Hypertension   . Iron deficiency anemia 07/30/2017  . Nephrolithiasis     Surgical History: Past Surgical History:  Procedure Laterality Date  . APPENDECTOMY     60 plus years ago  . CORONARY ANGIOPLASTY WITH STENT PLACEMENT    . CORONARY ARTERY BYPASS GRAFT        Allergies:  Allergies  Allergen Reactions  . Sulfa Antibiotics Rash    Family History: Family History  Problem Relation  Age of Onset  . Prostate cancer Father   . Prostate cancer Brother     Social History:  reports that he has quit smoking. He has never used smokeless tobacco. He reports that he does not drink alcohol or use drugs.  ROS: Please see flowsheet from today's date for complete review of systems.  Physical Exam: BP (!) 143/59 (BP Location: Left Arm)   Pulse 66   Temp (!) 100.7 F (38.2 C) (Oral)   Resp 16   Ht _0  (1.803 m)   Wt 76.8 kg   SpO2 94%   BMI 23.61 kg/m    Constitutional:  Alert and oriented, No acute distress. Cardiovascular: No clubbing, cyanosis, or edema. Respiratory: Normal respiratory effort, no increased work of breathing. GI: Abdomen is soft, nontender, nondistended, no abdominal masses GU: Uncircumcised phallus, lower abdomen tense consistent with distended bladder.  Right inguinal hernia with enlarged right scrotum. Lymph: No cervical or inguinal lymphadenopathy. Skin: No rashes, bruises or suspicious lesions. Neurologic: Grossly intact, no focal deficits, moving all 4 extremities. Psychiatric: Normal mood and affect.  Laboratory Data: Reviewed, see HPI  Pertinent Imaging: I have personally reviewed the CT dated 08/07/2019.  Bilateral hydroureteronephrosis with atrophic kidneys down to a distended bladder with diverticula  Procedure: The patient was prepped and draped in standard sterile fashion.  Verbal informed consent was obtained.  A 20 French coud two-way Foley passed easily into the bladder with return of purulent yellow urine.  10 cc was  placed in the balloon, and the catheter was connected to dependent drainage.  Assessment & Plan:   In summary, the patient is a 85 year old male currently admitted with UTI, fevers, acute on chronic stage IV renal failure, and CT showing bilateral hydroureteronephrosis with atrophic kidneys down to a distended bladder.  These findings are chronic since at least 2017.  It does not appear he is ever been evaluated by  urologist before, and he reports a long history of weak stream and difficulty voiding consistent with chronic bladder outlet obstruction.  -Maintain Foley at discharge -Continue broad-spectrum antibiotics, narrow as able pending cultures -Will arrange follow-up in urology clinic in 10 days, and further discuss options for bladder management at that time   Billey Co, [place] [address], [place], [place] [address] [phone]

## 2019-08-08 NOTE — Progress Notes (Signed)
[facility] [place] NAME: Jay Mathis    MR#:  [id number]  DATE OF BIRTH:  [birth year]  SUBJECTIVE:  CHIEF COMPLAINT:   Chief Complaint  Patient presents with   Fever    possible sepsis   Altered Mental Status   No fevers overnight.  No confusion this morning.  Patient complaining of some right groin discomfort/ pain  REVIEW OF SYSTEMS:  Review of Systems  Constitutional: Negative for chills and fever.  HENT: Negative for hearing loss and tinnitus.   Eyes: Negative for blurred vision and double vision.  Respiratory: Negative for cough and sputum production.   Cardiovascular: Negative for chest pain and palpitations.  Gastrointestinal: Negative for heartburn, nausea and vomiting.  Genitourinary: Negative for dysuria.       Right groin swelling and pain  Musculoskeletal: Negative for myalgias and neck pain.  Skin: Negative for itching and rash.  Neurological: Negative for dizziness and headaches.  Psychiatric/Behavioral: Negative for depression and hallucinations.    DRUG ALLERGIES:   Allergies  Allergen Reactions   Sulfa Antibiotics Rash   VITALS:  Blood pressure (!) 169/74, pulse 93, temperature 98.4 F (36.9 C), resp. rate 19, height 5\' 11"  (1.803 m), weight 76.8 kg, SpO2 96 %. PHYSICAL EXAMINATION:  Physical Exam  GENERAL:  85 y.o.-year-old patient lying in the bed with no acute distress.  EYES: Pupils equal, round, reactive to light and accommodation. No scleral icterus. Extraocular muscles intact.  HEENT: Head atraumatic, normocephalic. Oropharynx and nasopharynx clear.  NECK:  Supple, no jugular venous distention. No thyroid enlargement, no tenderness.  LUNGS: Normal breath sounds bilaterally, no wheezing, rales,rhonchi or crepitation. No use of accessory muscles of respiration.  CARDIOVASCULAR: S1, S2 normal. No murmurs, rubs, or gallops.  ABDOMEN: Soft, nontender, nondistended. Bowel sounds present.  Large  right inguinal hernia EXTREMITIES: No pedal edema, cyanosis, or clubbing. No rash or lesions. + pedal pulses MUSCULOSKELETAL: Normal bulk, and power was 5+ grip and elbow, knee, and ankle flexion and extension bilaterally.  NEUROLOGIC:Alert and oriented x 3. CN 2-12 intact. Sensation to light touch and cold stimuli intact bilaterally. Gait not tested due to safety concern. PSYCHIATRIC: The patient is alert and oriented x 3.  SKIN: No obvious rash, lesion, or ulcer.  LABORATORY PANEL:  Male CBC Recent Labs  Lab 08/08/19 0832  WBC 11.4*  HGB 9.9*  HCT 29.5*  PLT 307   ------------------------------------------------------------------------------------------------------------------ Chemistries  Recent Labs  Lab 08/07/19 1813 08/08/19 0832  NA 130* 133*  K 3.7 3.5  CL 92* 95*  CO2 24 25  GLUCOSE 198* 164*  BUN 50* 46*  CREATININE 3.01* 2.75*  CALCIUM 8.6* 8.7*  MG  --  1.8  AST 19  --   ALT 17  --   ALKPHOS 98  --   BILITOT 0.8  --    RADIOLOGY:  Ct Abdomen Pelvis Wo Contrast  Result Date: 08/07/2019 CLINICAL DATA:  Fever, confusion.  History of kidney infections. EXAM: CT ABDOMEN AND PELVIS WITHOUT CONTRAST TECHNIQUE: Multidetector CT imaging of the abdomen and pelvis was performed following the standard protocol without IV contrast. COMPARISON:  06/10/2017 FINDINGS: Lower chest: No acute abnormality Hepatobiliary: No focal hepatic abnormality. Gallbladder unremarkable. Pancreas: No focal abnormality or ductal dilatation. Spleen: No focal abnormality.  Normal size. Adrenals/Urinary Tract: Severe bilateral hydronephrosis and hydroureter. Markedly thick walled, trabeculated bladder with numerous diverticula. Cortical thinning within the kidneys bilaterally and bilateral perinephric stranding. Adrenal glands unremarkable. Stomach/Bowel: Sigmoid diverticulosis.  No active diverticulitis. Stomach, large and small bowel grossly unremarkable. Large right inguinal hernia containing  numerous small bowel loops. No evidence of bowel obstruction. Vascular/Lymphatic: No evidence of aneurysm or adenopathy. Reproductive: Prostate enlargement. Other: No free fluid or free air. Musculoskeletal: No acute bony abnormality. IMPRESSION: Severe bilateral hydronephrosis and hydroureter with markedly thick walled, trabeculated bladder. Prostate enlargement. Sigmoid diverticulosis.  No active diverticulitis. Large right inguinal hernia containing several small bowel loops. No evidence of bowel obstruction. Aortic atherosclerosis.  In Electronically Signed   By: Rolm Baptise M.D.   On: 08/07/2019 23:40   Dg Chest Port 1 View  Result Date: 08/07/2019 CLINICAL DATA:  Fever EXAM: PORTABLE CHEST 1 VIEW COMPARISON:  None. FINDINGS: Mild cardiomegaly and CABG changes noted. Mild pulmonary vascular congestion is present. There is no evidence of focal airspace disease, pulmonary edema, suspicious pulmonary nodule/mass, pleural effusion, or pneumothorax. No acute bony abnormalities are identified. IMPRESSION: Mild cardiomegaly with mild pulmonary vascular congestion. Electronically Signed   By: Margarette Canada M.D.   On: 08/07/2019 18:33   ASSESSMENT AND PLAN:   85 y.o. male with past medical history of CAD, CKD, diabetes mellitus, GERD, hyperlipidemia, hypertension, iron deficiency anemia, and right inguinal hernia presenting to the ED with altered mental status.  1. Altered mental status -likely due to UTI Patient awake and alert and oriented this morning.  No evidence of confusion this morning. Continue treatment of urinary tract infection with antibiotics  2. Urinary tract infection - UA shows UTI - Urine cultures pending - Empiric antibiotics with ceftriaxone - Urology was consult given patient with history of right inguinal hernia with recurrent UTIs.  3.   Large right inguinal hernia with some pain  - Previously evaluated by general surgery Dr. Dahlia Byes in 2018, no surgical intervention  recommended at that time Patient had a CT scan of the abdomen and pelvis done yesterday which revealed severe bilateral hydronephrosis and hydroureter with thick-walled trabeculated bladder(urology already consulted. CT scan also revealed large right inguinal hernia containing several small bowel loops.  No evidence of bowel obstruction. I have requested for surgeon on-call to evaluate since patient complaining of pain at the site of the hernia.   4. Acute on chronic kidney disease Renal function gradually improving.  5. Coronary Artery Disease - ASA 81mg  PO daily - HTN, HLD, DM control as below  5. HLD  + Goal LDL<100 - Atorvastatin 10mg  PO qhs  6. HTN = + Goal BP <130/80 -Continue Coreg, amlodipine  7. Diabetes Mellitus Type 2 with complications - Hold glimepiride - CBG monitoring - SSI - DM educator to see  8. Anemia of chronic disease -Hemoglobin 9.9   DVT prophylaxis - Heparin SubQ   All the records are reviewed and case discussed with Care Management/Social Worker. Management plans discussed with the patient, family and they are in agreement.  CODE STATUS: DNR  TOTAL TIME TAKING CARE OF THIS PATIENT: 36 minutes.   More than 50% of the time was spent in counseling/coordination of care: YES  POSSIBLE D/C IN 3 DAYS, DEPENDING ON CLINICAL CONDITION.   Shakenna Herrero M.D on 08/08/2019 at 12:58 PM  Between 7am to 6pm - Pager - [phone]  After 6pm go to [email or web address] - Proofreader  [facility]  Office  [phone]  CC: Primary care physician; [name], MD  Note: This dictation was prepared with Dragon dictation along with smaller phrase technology. Any transcriptional errors that result from this process are unintentional.

## 2019-08-09 LAB — BASIC METABOLIC PANEL
Anion gap: 10 (ref 5–15)
Anion gap: 10 (ref 5–15)
BUN: 48 mg/dL — ABNORMAL HIGH (ref 8–23)
BUN: 47 mg/dL — ABNORMAL HIGH (ref 8–23)
CO2: 27 mmol/L (ref 22–32)
CO2: 27 mmol/L (ref 22–32)
Calcium: 8.4 mg/dL — ABNORMAL LOW (ref 8.9–10.3)
Calcium: 8.5 mg/dL — ABNORMAL LOW (ref 8.9–10.3)
Chloride: 98 mmol/L (ref 98–111)
Chloride: 96 mmol/L — ABNORMAL LOW (ref 98–111)
Creatinine, Ser: 2.79 mg/dL — ABNORMAL HIGH (ref 0.61–1.24)
Creatinine, Ser: 2.69 mg/dL — ABNORMAL HIGH (ref 0.61–1.24)
GFR calc Af Amer: 22 mL/min — ABNORMAL LOW (ref >60)
GFR calc Af Amer: 23 mL/min — ABNORMAL LOW (ref >60)
GFR calc non Af Amer: 19 mL/min — ABNORMAL LOW (ref >60)
GFR calc non Af Amer: 20 mL/min — ABNORMAL LOW (ref >60)
Glucose, Bld: 146 mg/dL — ABNORMAL HIGH (ref 70–99)
Glucose, Bld: 189 mg/dL — ABNORMAL HIGH (ref 70–99)
Potassium: 3.8 mmol/L (ref 3.5–5.1)
Potassium: 3.7 mmol/L (ref 3.5–5.1)
Sodium: 135 mmol/L (ref 135–145)
Sodium: 133 mmol/L — ABNORMAL LOW (ref 135–145)

## 2019-08-09 LAB — URINE CULTURE: Culture: >=100000 — AB

## 2019-08-09 LAB — CBC
HCT: 25.8 % — ABNORMAL LOW (ref 39.0–52.0)
Hemoglobin: 8.5 g/dL — ABNORMAL LOW (ref 13.0–17.0)
MCH: 27.8 pg (ref 26.0–34.0)
MCHC: 32.9 g/dL (ref 30.0–36.0)
MCV: 84.3 fL (ref 80.0–100.0)
Platelets: 302 10*3/uL (ref 150–400)
RBC: 3.06 MIL/uL — ABNORMAL LOW (ref 4.22–5.81)
RDW: 13.7 % (ref 11.5–15.5)
WBC: 10.8 10*3/uL — ABNORMAL HIGH (ref 4.0–10.5)
nRBC: 0 % (ref 0.0–0.2)

## 2019-08-09 LAB — MAGNESIUM
Magnesium: 1.8 mg/dL (ref 1.7–2.4)
Magnesium: 1.9 mg/dL (ref 1.7–2.4)

## 2019-08-09 LAB — GLUCOSE, CAPILLARY
Glucose-Capillary: 127 mg/dL — ABNORMAL HIGH (ref 70–99)
Glucose-Capillary: 190 mg/dL — ABNORMAL HIGH (ref 70–99)
Glucose-Capillary: 147 mg/dL — ABNORMAL HIGH (ref 70–99)

## 2019-08-09 MED ORDER — SODIUM CHLORIDE 0.9 % IV SOLN
1.0000 g | INTRAVENOUS | Status: DC
  Administered 2019-08-09 – 2019-08-10 (×2): 1 g via INTRAVENOUS
  Filled 2019-08-09: qty 10
  Filled 2019-08-09: qty 1
  Filled 2019-08-09: qty 10

## 2019-08-09 MED ORDER — FUROSEMIDE 40 MG PO TABS
40.0000 mg | ORAL_TABLET | Freq: Every day | ORAL | Status: DC
  Administered 2019-08-09 – 2019-08-10 (×2): 40 mg via ORAL
  Filled 2019-08-09: qty 1

## 2019-08-09 NOTE — Progress Notes (Addendum)
   Subjective Foley placed last night with return of purulent yellow urine Diuresing overnight with greater than 1000 mL output Feels much improved this morning, denies any lower abdominal pain  Physical Exam: BP (!) 142/54 (BP Location: Left Arm)   Pulse (!) 58   Temp 99.4 F (37.4 C) (Oral)   Resp 16   Ht 5\' 11"  (1.803 m)   Wt 76.8 kg   SpO2 91%   BMI 23.61 kg/m    Constitutional:  Alert and oriented, No acute distress. Respiratory: Normal respiratory effort, no increased work of breathing. GI: Abdomen is soft, non-tender, non-distended GU: No CVA tenderness Drains: 49 French two-way Foley with cloudy yellow urine  Laboratory Data: Reviewed  Assessment & Plan:   The patient is a 85 year old male admitted with UTI with likely chronic bladder outlet obstruction versus atonic bladder with distended bladder and bilateral hydroureteronephrosis and CKD since at least 2017.  Status post Foley placement 8/29 with return of purulent yellow urine, and improvement in his lower abdominal pain.  -Continue to monitor for postobstructive diuresis, consider repeat electrolytes this afternoon -Allow free access to fluids -Recommend 10-14 day course culture appropriate abx -Maintain Foley -Will arrange follow-up in urology clinic in 1 to 2 weeks to discuss long-term bladder management options -Call if questions  I updated his son Jay Mathis over the phone today as well, and likely need for long term foley with monthly changes  Billey Co, MD

## 2019-08-09 NOTE — Evaluation (Signed)
Physical Therapy Evaluation Patient Details Name: Jay Mathis MRN: [id number] DOB: 05/09/1924 Today's Date: 08/09/2019   History of Present Illness  Pt is a 85 year old male presenting to ED with UTI and bilateral hydroureteronephrosis with a distended bladder. Past medical history notable for CAD status post CABG and stage IV CKD with baseline creatinine 2.8, eGFR of 18 that was admitted yesterday 08/07/2019 with fevers and confusion, and found to have urinary tract infection. Lives alone, 2 sons in town help with community ADLs  Clinical Impression  Patient with current limitations in dynamic balance, coordination/RW safety, and LE strength. Activity limitations in ambulation, transfers, stairs; inhibiting full participation in PLOF ind ADLs without AD. Patient currently with strong need for RW assistance for safety with ambulation, unable to manage much dynamic challenge, and minA for initiation of STS without LOB. Patient is appropriate for HHPT, with supervision, which PT thinks is probable d/t strong family support. Would benefit from skilled PT to address above deficits and promote optimal return to PLOF     Follow Up Recommendations Supervision/Assistance - 24 hour    Equipment Recommendations  Rolling walker with 5" wheels    Recommendations for Other Services       Precautions / Restrictions        Mobility  Bed Mobility Overal bed mobility: Modified Independent                Transfers Overall transfer level: Needs assistance   Transfers: Sit to/from Stand Sit to Stand: Min assist         General transfer comment: VC for hand placement/set up, minA for initiation of stand, able to sit with VC for hand placement and good control  Ambulation/Gait Ambulation/Gait assistance: Supervision Gait Distance (Feet): 200 Feet Assistive device: Rolling walker (2 wheeled) Gait Pattern/deviations: Decreased stride length Gait velocity: decreased   General Gait  Details: able to comply with cuing for increased stride length bilat, cuing needed to "stay inside RW", difficulty with head turns and speed changes- able to maintain balance, with decreased cervical ROM and decreased difference between speed changes  Stairs            Wheelchair Mobility    Modified Rankin (Stroke Patients Only)       Balance Overall balance assessment: Needs assistance Sitting-balance support: No upper extremity supported Sitting balance-Leahy Scale: Good   Postural control: Posterior lean Standing balance support: No upper extremity supported Standing balance-Leahy Scale: Good                               Pertinent Vitals/Pain Pain Assessment: No/denies pain    Home Living Family/patient expects to be discharged to:: Private residence Living Arrangements: Alone Available Help at Discharge: Family Type of Home: House Home Access: Stairs to enter   Technical brewer of Steps: 1 Home Layout: One level Home Equipment: Environmental consultant - 2 wheels;Cane - single point;Walker - 4 wheels      Prior Function Level of Independence: Independent         Comments: Ind without AD for ADLs, drives, works in his garden. Has 2 sons in town that help with community ADLs and bring him groceries     Hand Dominance   Dominant Hand: Right    Extremity/Trunk Assessment   Upper Extremity Assessment Upper Extremity Assessment: Overall WFL for tasks assessed    Lower Extremity Assessment Lower Extremity Assessment: Overall WFL for tasks assessed  Cervical / Trunk Assessment Cervical / Trunk Assessment: Normal  Communication   Communication: HOH  Cognition Arousal/Alertness: Awake/alert Behavior During Therapy: WFL for tasks assessed/performed Overall Cognitive Status: Within Functional Limits for tasks assessed                                        General Comments      Exercises Other Exercises Other Exercises:  Supine to sit: patient able to complete with use of handrails d/t "heaviness" in scrotum Other Exercises: STS: minA for initiation with cuing needed for hand placement and set up. Following VC for sit technique, pt able to complete with safety and eccentric lowering to chair. Other Exercises: Amb 272f with RW with supervision and cuing for RW management. PT cued patient throguh dynamic balance tasks with head turns, patient unable to acheive full horizontal or vertical head tuens, but able to maintain balance and ambulation in range he was comfortable with. Difficulty initiating speed changes, but able to complete without LOB. Education on sitting in chair for lung health.   Assessment/Plan    PT Assessment Patient needs continued PT services  PT Problem List Decreased strength;Decreased mobility;Decreased balance       PT Treatment Interventions DME instruction;Therapeutic activities;Gait training;Therapeutic exercise;Patient/family education;Stair training;Balance training;Functional mobility training;Neuromuscular re-education    PT Goals (Current goals can be found in the Care Plan section)  Acute Rehab PT Goals Patient Stated Goal: go home PT Goal Formulation: With patient Time For Goal Achievement: 08/23/19 Potential to Achieve Goals: Fair    Frequency Min 2X/week   Barriers to discharge Decreased caregiver support      Co-evaluation               AM-PAC PT "6 Clicks" Mobility  Outcome Measure Help needed turning from your back to your side while in a flat bed without using bedrails?: A Little Help needed moving from lying on your back to sitting on the side of a flat bed without using bedrails?: A Little Help needed moving to and from a bed to a chair (including a wheelchair)?: A Little Help needed standing up from a chair using your arms (e.g., wheelchair or bedside chair)?: A Little Help needed to walk in hospital room?: A Little Help needed climbing 3-5 steps with  a railing? : A Lot 6 Click Score: 17    End of Session Equipment Utilized During Treatment: Gait belt Activity Tolerance: Patient tolerated treatment well Patient left: in chair;with chair alarm set;with call bell/phone within reach   PT Visit Diagnosis: Unsteadiness on feet (R26.81);Other abnormalities of gait and mobility (R26.89);Difficulty in walking, not elsewhere classified (R26.2)    Time: 13536-1443PT Time Calculation (min) (ACUTE ONLY): 27 min   Charges:   PT Evaluation $PT Eval Moderate Complexity: 1 Mod PT Treatments $Therapeutic Activity: 23-37 mins       CShelton SilvasPT, DPT  CShelton Silvas8/30/2020, 12:06 PM

## 2019-08-09 NOTE — Plan of Care (Signed)

## 2019-08-09 NOTE — Progress Notes (Signed)
[facility] [place] NAME: Jay Mathis    MR#:  [id number]  DATE OF BIRTH:  04/04/1924  SUBJECTIVE:  CHIEF COMPLAINT:   Chief Complaint  Patient presents with  . Fever    possible sepsis  . Altered Mental Status   Patient had a temperature 100.7 last night.  No new complaint this morning.  No confusion.   Seen by general surgeon and urologist already.     REVIEW OF SYSTEMS:  Review of Systems  Constitutional: Negative for chills and fever.  HENT: Negative for hearing loss and tinnitus.   Eyes: Negative for blurred vision and double vision.  Respiratory: Negative for cough and sputum production.   Cardiovascular: Negative for chest pain and palpitations.  Gastrointestinal: Negative for heartburn, nausea and vomiting.  Genitourinary: Negative for dysuria.       Right groin swelling and pain  Musculoskeletal: Negative for myalgias and neck pain.  Skin: Negative for itching and rash.  Neurological: Negative for dizziness and headaches.  Psychiatric/Behavioral: Negative for depression and hallucinations.    DRUG ALLERGIES:   Allergies  Allergen Reactions  . Sulfa Antibiotics Rash   VITALS:  Blood pressure (!) 142/54, pulse (!) 58, temperature 99.4 F (37.4 C), temperature source Oral, resp. rate 16, height 5\' 11"  (1.803 m), weight 76.8 kg, SpO2 91 %. PHYSICAL EXAMINATION:  Physical Exam  GENERAL:  85 y.o.-year-old patient lying in the bed with no acute distress.  EYES: Pupils equal, round, reactive to light and accommodation. No scleral icterus. Extraocular muscles intact.  HEENT: Head atraumatic, normocephalic. Oropharynx and nasopharynx clear.  NECK:  Supple, no jugular venous distention. No thyroid enlargement, no tenderness.  LUNGS: Normal breath sounds bilaterally, no wheezing, rales,rhonchi or crepitation. No use of accessory muscles of respiration.  CARDIOVASCULAR: S1, S2 normal. No murmurs, rubs, or gallops.   ABDOMEN: Soft, nontender, nondistended. Bowel sounds present.  Large right inguinal hernia EXTREMITIES: No pedal edema, cyanosis, or clubbing. No rash or lesions. + pedal pulses MUSCULOSKELETAL: Normal bulk, and power was 5+ grip and elbow, knee, and ankle flexion and extension bilaterally.  NEUROLOGIC:Alert and oriented x 3. CN 2-12 intact. Sensation to light touch and cold stimuli intact bilaterally. Gait not tested due to safety concern. PSYCHIATRIC: The patient is alert and oriented x 3.  SKIN: No obvious rash, lesion, or ulcer.  LABORATORY PANEL:  Male CBC Recent Labs  Lab 08/09/19 0541  WBC 10.8*  HGB 8.5*  HCT 25.8*  PLT 302   ------------------------------------------------------------------------------------------------------------------ Chemistries  Recent Labs  Lab 08/07/19 1813  08/09/19 0541  NA 130*   < > 135  K 3.7   < > 3.8  CL 92*   < > 98  CO2 24   < > 27  GLUCOSE 198*   < > 146*  BUN 50*   < > 48*  CREATININE 3.01*   < > 2.79*  CALCIUM 8.6*   < > 8.4*  MG  --    < > 1.8  AST 19  --   --   ALT 17  --   --   ALKPHOS 98  --   --   BILITOT 0.8  --   --    < > = values in this interval not displayed.   RADIOLOGY:  No results found. ASSESSMENT AND PLAN:   85 y.o. male with past medical history of CAD, CKD, diabetes mellitus, GERD, hyperlipidemia, hypertension, iron deficiency anemia, and right inguinal hernia  presenting to the ED with altered mental status.  1. Altered mental status -likely due to UTI Patient awake and alert and oriented this morning.  Altered mental status resolved.  Continue treatment of urinary tract infection with antibiotics  2. Urinary tract infection - UA shows UTI - Urine cultures growing greater than 100,000 colonies beta-hemolytic organism.  Sensitivities pending. - Empiric antibiotics with ceftriaxone Patient with bilateral hydronephrosis with atrophic kidneys down to a distended bladder.  Findings are chronic since 2017.   Patient with recurrent UTIs and long history of weak stream and difficulty voiding consistent with chronic bladder outlet obstruction.  Patient seen by urologist.  Appreciate input.  Foley catheter placed.  To monitor for postobstructive diuresis.  Will repeat electrolytes this afternoon. To maintain Foley catheter in place on discharge.  Treat UTI for 10 to 14 days as recommended by urologist. Follow-up with urologist in 10 days to further discuss options for bladder management.  3.   Large right inguinal hernia with some pain  - Previously evaluated by general surgery Dr. Dahlia Byes in 2018, no surgical intervention recommended at that time Patient had a CT scan of the abdomen and pelvis done on admission which revealed severe bilateral hydronephrosis and hydroureter with thick-walled trabeculated bladder(urology already consulted. CT scan also revealed large right inguinal hernia containing several small bowel loops.  No evidence of bowel obstruction.  Patient seen by surgeon on-call Dr. Celine Ahr who recommended scrotal support and analgesics for now.  Prior to surgical repair patient will require medical clearance as well as potential cardiac clearance given history of CAD.  Also has mesh will be required, there should be no persistent infection to avoid seeding of the mesh.  Recommendation is to follow-up with surgeon in clinic post discharge from the hospital. Patient feeling much better and pain significantly improved this morning.  4. Acute on chronic kidney disease Renal function gradually improving.  5. Coronary Artery Disease - ASA 81mg  PO daily - HTN, HLD, DM control as below  5. HLD  + Goal LDL<100 - Atorvastatin 10mg  PO qhs  6. HTN = + Goal BP <130/80 -Continue Coreg, amlodipine  7. Diabetes Mellitus Type 2 with complications - Hold glimepiride - CBG monitoring - SSI - DM educator to see  8. Anemia of chronic disease -Hemoglobin 9.9   DVT prophylaxis - Heparin SubQ    All the records are reviewed and case discussed with Care Management/Social Worker. Management plans discussed with the patient, family and they are in agreement. Call patient son Mr. [name].  Updated him on treatment plans as outlined.  CODE STATUS: DNR  TOTAL TIME TAKING CARE OF THIS PATIENT: 34 minutes.   More than 50% of the time was spent in counseling/coordination of care: YES  POSSIBLE D/C IN 2 DAYS, DEPENDING ON CLINICAL CONDITION.   Laiklynn Raczynski M.D on 08/09/2019 at 12:14 PM  Between 7am to 6pm - Pager - [phone]  After 6pm go to [email or web address] - Proofreader  [facility]  Office  [phone]  CC: Primary care physician; [name], MD  Note: This dictation was prepared with Dragon dictation along with smaller phrase technology. Any transcriptional errors that result from this process are unintentional.

## 2019-08-10 ENCOUNTER — Telehealth: Payer: Self-pay | Admitting: Urology

## 2019-08-10 LAB — BASIC METABOLIC PANEL
Anion gap: 9 (ref 5–15)
BUN: 41 mg/dL — ABNORMAL HIGH (ref 8–23)
CO2: 29 mmol/L (ref 22–32)
Calcium: 8.5 mg/dL — ABNORMAL LOW (ref 8.9–10.3)
Chloride: 98 mmol/L (ref 98–111)
Creatinine, Ser: 2.38 mg/dL — ABNORMAL HIGH (ref 0.61–1.24)
GFR calc Af Amer: 26 mL/min — ABNORMAL LOW (ref >60)
GFR calc non Af Amer: 23 mL/min — ABNORMAL LOW (ref >60)
Glucose, Bld: 157 mg/dL — ABNORMAL HIGH (ref 70–99)
Potassium: 3.7 mmol/L (ref 3.5–5.1)
Sodium: 136 mmol/L (ref 135–145)

## 2019-08-10 LAB — CBC
HCT: 25.4 % — ABNORMAL LOW (ref 39.0–52.0)
Hemoglobin: 8.4 g/dL — ABNORMAL LOW (ref 13.0–17.0)
MCH: 28.3 pg (ref 26.0–34.0)
MCHC: 33.1 g/dL (ref 30.0–36.0)
MCV: 85.5 fL (ref 80.0–100.0)
Platelets: 290 10*3/uL (ref 150–400)
RBC: 2.97 MIL/uL — ABNORMAL LOW (ref 4.22–5.81)
RDW: 13.7 % (ref 11.5–15.5)
WBC: 11 10*3/uL — ABNORMAL HIGH (ref 4.0–10.5)
nRBC: 0 % (ref 0.0–0.2)

## 2019-08-10 LAB — GLUCOSE, CAPILLARY
Glucose-Capillary: 147 mg/dL — ABNORMAL HIGH (ref 70–99)
Glucose-Capillary: 223 mg/dL — ABNORMAL HIGH (ref 70–99)

## 2019-08-10 LAB — MAGNESIUM: Magnesium: 1.8 mg/dL (ref 1.7–2.4)

## 2019-08-10 MED ORDER — CEPHALEXIN 250 MG PO CAPS: 250.0000 mg | ORAL_CAPSULE | Freq: Two times a day (BID) | ORAL | 0 refills | Status: AC

## 2019-08-10 NOTE — Progress Notes (Signed)
Pt d/c to home with son. IVs removed intact. Education completed. All questions answered. All belongings sent with pt.

## 2019-08-10 NOTE — Telephone Encounter (Signed)
-----   Message from Billey Co, MD sent at 08/08/2019  9:29 PM EDT ----- Regarding: follow up Please arrange follow up with me in ~2 weeks to discuss foley and bladder management options.  Thanks Nickolas Madrid, MD 08/08/2019

## 2019-08-10 NOTE — TOC Transition Note (Signed)
Transition of Care [facility]) - CM/SW Discharge Note   Patient Details  Name: Jay Mathis MRN: [id number] Date of Birth: Dec 29, 1923  Transition of Care [facility]) CM/SW Contact:  Chasyn Cinque, Lenice Llamas Phone Number: [phone]  08/10/2019, 2:55 PM   Clinical Narrative: PT is recommending home health with 24/7 supervision. Per chart patient is not alert and oriented. Clinical Social Worker (CSW) contacted patient's son [name] to complete assessment. Per [name] he is patient's HPOA and lives 2 houses down from patient in [place]. Per [name] patient lives alone and patient's wife passed away 2018/08/24. Per [name] patient has 2 sons Fritz Pickerel and himself. Per [name] him and Fritz Pickerel check on patient daily and spend several hours per day with patient. CSW explained that PT is recommending home health and 24/7 supervision. [name] asked if patient needs 24/7 supervision forever at this point. CSW explained that due to patient's dementia he will likely continue to decline cognitively and will need more assistance. Son verbalized his understanding and reported that he will talk with his brother Fritz Pickerel and come up with a plan. [name] is agreeable to home health and does not have a preference. Per Helene Kelp [facility] home health agency representative they can accept patient and will add a social worker to help with long term care planning. Son is agreeable to [facility] and stated that patient will need a rolling walker. [employer] agency representative is aware of above. Rolling walker delivered to patient's room. Patient will D/C home today. Son is aware of D/C today and will provide transport. Helene Kelp [facility] home health representative is aware of D/C today. RN aware of above. Please reconsult if future social work needs arise. CSW signing off.     Final next level of care: [place] Barriers to Discharge: Barriers Resolved   Patient Goals and CMS Choice Patient states their goals for this  hospitalization and ongoing recovery are:: To go home. CMS Medicare.gov Compare Post Acute Care list provided to:: Patient Choice offered to / list presented to : Patient  Discharge Placement                       Discharge Plan and Services In-house Referral: Clinical Social Work Discharge Planning Services: CM Consult Post Acute Care Choice: Home Health          DME Arranged: Gilford Rile rolling DME Agency: [employer] Date DME Agency Contacted: 08/10/19 Time DME Agency Contacted: 1207 Representative spoke with at DME Agency: [place]: PT, Nurse's Aide, Social Work [employer] Agency: [facility] [employer] Date [place]: 08/10/19 Time [place]: 1207 Representative spoke with at [place]: [place] ([place]) Interventions     Readmission Risk Interventions No flowsheet data found.

## 2019-08-10 NOTE — Plan of Care (Signed)

## 2019-08-10 NOTE — Telephone Encounter (Signed)
Patient was admitted to hospital Friday night

## 2019-08-10 NOTE — Telephone Encounter (Signed)
APP MADE LM TO CB TO CONFIRM MAILED 08-10-19 Jay Mathis

## 2019-08-10 NOTE — Discharge Summary (Signed)
[facility] [place] NAME: Jay Mathis    MR#:  [id number]  DATE OF BIRTH:  1924-07-16  DATE OF ADMISSION:  08/07/2019   ADMITTING PHYSICIAN: Lang Snow, NP  DATE OF DISCHARGE: 08/10/2019  PRIMARY CARE PHYSICIAN: [name], MD   ADMISSION DIAGNOSIS:  Lower urinary tract infectious disease [N39.0] Weakness [R53.1] DISCHARGE DIAGNOSIS:  Active Problems:   UTI (urinary tract infection)  SECONDARY DIAGNOSIS:   Past Medical History:  Diagnosis Date  . CAD (coronary artery disease)   . CKD (chronic kidney disease) stage 4, GFR 15-29 ml/min (HCC)   . Diabetes mellitus without complication ([place])   . GERD (gastroesophageal reflux disease)   . Hyperlipidemia   . Hypertension   . Iron deficiency anemia 07/30/2017  . Nephrolithiasis    HOSPITAL COURSE:  Chief complaint; altered mental status  History of presenting complaint;  85 year old male with past medical history of CAD, CKD, diabetes mellitus, GERD, hyperlipidemia, hypertension, iron deficiency anemia, and right inguinal hernia presenting to the ED with altered mental status.  Patient was diagnosed with urinary tract infection and admitted  Hospital course; 1.Altered mental status-likely due to UTI Patient awake and alert and oriented this morning.  Altered mental status resolved.  Patient back to baseline.  2.Urinary tract infection Patient found to have urinary tract infection.  Urine culture came back growing strep agalactiae.  Patient was initially treated with IV Rocephin.  Discussed P antibiotics options with pharmacist.  Plan is for discharge on p.o. Keflex to complete treatment duration as recommended by urologist. Patient with bilateral hydronephrosis with atrophic kidneys down to a distended bladder.  Findings are chronic since 2017.  Patient with recurrent UTIs and long history of weak stream and difficulty voiding consistent with chronic bladder  outlet obstruction.  Patient seen by urologist.  Appreciate input.  Foley catheter placed. To maintain Foley catheter in place on discharge.  Treat UTI for 10 to 14 days as recommended by urologist.Follow-up with urologist in 1 week to further discuss options for bladder management.  3.  Large right inguinal hernia with some pain - Previously evaluated by general surgery Dr.Pabon in 2018, no surgical intervention recommended at that time. Patient had a CT scan of the abdomen and pelvis done on admission which revealed severe bilateral hydronephrosis and hydroureter with thick-walled trabeculated bladder(urology already consulted. CT scan also revealed large right inguinal hernia containing several small bowel loops.  No evidence of bowel obstruction.  Patient seen by surgeon on-call Dr. Celine Ahr who recommended scrotal support and analgesics for now.  Prior to surgical repair patient will require medical clearance as well as potential cardiac clearance given history of CAD.  Also has mesh will be required, there should be no persistent infection to avoid seeding of the mesh.  Recommendation is to follow-up with surgeon in clinic post discharge from the hospital. Patient feeling much better and pain significantly improved this morning.  Continue scrotal support at home until reevaluated by surgeon as outpatient  4.Acute on chronic kidney disease Renal function gradually improving.  5.Coronary Artery Disease - ASA 81mg  PO daily - HTN, HLD, DM control as below  5.HLD  + Goal LDL<100 - Atorvastatin10mg  PO qhs  6.HTN = + Goal BP <130/80 -Continue Coreg, amlodipine  7.Diabetes Mellitus Type 2 with complications - Holdglimepiride - CBG monitoring - SSI - DM educator to see  8.Anemia of chronic disease-Hemoglobin 9.9  Patient with debility due to multiple medical problems.  Case manager to set up home health with physical therapy, nursing aide and social worker follow-up as  outpatient. Updated son previously on treatment and discharge plans for today.  All questions were answered. DISCHARGE CONDITIONS:  Stable CONSULTS OBTAINED:  Treatment Team:  Billey Co, MD DRUG ALLERGIES:   Allergies  Allergen Reactions  . Sulfa Antibiotics Rash   DISCHARGE MEDICATIONS:   Allergies as of 08/10/2019      Reactions   Sulfa Antibiotics Rash      Medication List    TAKE these medications   amLODipine 10 MG tablet Commonly known as: NORVASC Take 10 mg by mouth daily.   aspirin EC 81 MG tablet Take 81 mg by mouth daily.   atorvastatin 10 MG tablet Commonly known as: LIPITOR Take 10 mg by mouth at bedtime.   carvedilol 3.125 MG tablet Commonly known as: COREG Take 3.125 mg 2 (two) times daily by mouth.   cephALEXin 250 MG capsule Commonly known as: KEFLEX Take 1 capsule (250 mg total) by mouth 2 (two) times daily for 10 days.   folic acid 1 MG tablet Commonly known as: FOLVITE Take 1 tablet (1 mg total) by mouth daily.   furosemide 40 MG tablet Commonly known as: LASIX Take 40 mg by mouth daily.   glimepiride 2 MG tablet Commonly known as: AMARYL Take 2 mg by mouth every morning.   pantoprazole 40 MG tablet Commonly known as: PROTONIX Take 40 mg by mouth 2 (two) times daily.   sodium bicarbonate 650 MG tablet Take 650 mg by mouth 2 (two) times daily.   tamsulosin 0.4 MG Caps capsule Commonly known as: FLOMAX Take 1 capsule by mouth daily.   vitamin B-12 500 MCG tablet Commonly known as: CYANOCOBALAMIN Take 1 tablet (500 mcg total) by mouth daily.   Vitamin D (Ergocalciferol) 1.25 MG (50000 UT) Caps capsule Commonly known as: DRISDOL Take 50,000 Units by mouth every 7 (seven) days.            Durable Medical Equipment  (From admission, onward)         Start     Ordered   08/10/19 1314  For home use only DME Walker rolling  [facility])  Once    Question:  Patient needs a walker to treat with the following condition   Answer:  Unsteady gait   08/10/19 1314           DISCHARGE INSTRUCTIONS:   DIET:  Cardiac diet DISCHARGE CONDITION:  Stable ACTIVITY:  Activity as tolerated OXYGEN:  Home Oxygen: No.  Oxygen Delivery: room air DISCHARGE LOCATION:  home   If you experience worsening of your admission symptoms, develop shortness of breath, life threatening emergency, suicidal or homicidal thoughts you must seek medical attention immediately by calling 911 or calling your MD immediately  if symptoms less severe.  You Must read complete instructions/literature along with all the possible adverse reactions/side effects for all the Medicines you take and that have been prescribed to you. Take any new Medicines after you have completely understood and accpet all the possible adverse reactions/side effects.   Please note  You were cared for by a hospitalist during your hospital stay. If you have any questions about your discharge medications or the care you received while you were in the hospital after you are discharged, you can call the unit and asked to speak with the hospitalist on call if the hospitalist that took care of you is not available. Once you are  discharged, your primary care physician will handle any further medical issues. Please note that NO REFILLS for any discharge medications will be authorized once you are discharged, as it is imperative that you return to your primary care physician (or establish a relationship with a primary care physician if you do not have one) for your aftercare needs so that they can reassess your need for medications and monitor your lab values.    On the day of Discharge:  VITAL SIGNS:  Blood pressure (!) 145/61, pulse 66, temperature 98.3 F (36.8 C), resp. rate 19, height 5\' 11"  (1.803 m), weight 76.8 kg, SpO2 95 %. PHYSICAL EXAMINATION:  GENERAL:  85 y.o.-year-old patient lying in the bed with no acute distress.  EYES: Pupils equal, round, reactive to  light and accommodation. No scleral icterus. Extraocular muscles intact.  HEENT: Head atraumatic, normocephalic. Oropharynx and nasopharynx clear.  NECK:  Supple, no jugular venous distention. No thyroid enlargement, no tenderness.  LUNGS: Normal breath sounds bilaterally, no wheezing, rales,rhonchi or crepitation. No use of accessory muscles of respiration.  CARDIOVASCULAR: S1, S2 normal. No murmurs, rubs, or gallops.  ABDOMEN: Soft, non-tender, non-distended. Bowel sounds present. No organomegaly or mass. Large right inguinal hernia.  Appears more reduced in size this morning.  Nontender. EXTREMITIES: No pedal edema, cyanosis, or clubbing.  NEUROLOGIC: Cranial nerves II through XII are intact. Muscle strength 5/5 in all extremities. Sensation intact. Gait not checked.  PSYCHIATRIC: The patient is alert and oriented x 3.  SKIN: No obvious rash, lesion, or ulcer.  DATA REVIEW:   CBC Recent Labs  Lab 08/10/19 0518  WBC 11.0*  HGB 8.4*  HCT 25.4*  PLT 290    Chemistries  Recent Labs  Lab 08/07/19 1813  08/10/19 0518  NA 130*   < > 136  K 3.7   < > 3.7  CL 92*   < > 98  CO2 24   < > 29  GLUCOSE 198*   < > 157*  BUN 50*   < > 41*  CREATININE 3.01*   < > 2.38*  CALCIUM 8.6*   < > 8.5*  MG  --    < > 1.8  AST 19  --   --   ALT 17  --   --   ALKPHOS 98  --   --   BILITOT 0.8  --   --    < > = values in this interval not displayed.     Microbiology Results  Results for orders placed or performed during the hospital encounter of 08/07/19  Blood Culture (routine x 2)     Status: None (Preliminary result)   Collection Time: 08/07/19  6:13 PM   Specimen: BLOOD  Result Value Ref Range Status   Specimen Description BLOOD R FOREARM  Final   Special Requests   Final    BOTTLES DRAWN AEROBIC AND ANAEROBIC Blood Culture results may not be optimal due to an excessive volume of blood received in culture bottles   Culture   Final    NO GROWTH 3 DAYS Performed at [facility], [address] [place], [place] [address]    Report Status PENDING  Incomplete  Blood Culture (routine x 2)     Status: None (Preliminary result)   Collection Time: 08/07/19  6:13 PM   Specimen: BLOOD  Result Value Ref Range Status   Specimen Description BLOOD L FOREARM  Final   Special Requests   Final  BOTTLES DRAWN AEROBIC AND ANAEROBIC Blood Culture results may not be optimal due to an excessive volume of blood received in culture bottles   Culture   Final    NO GROWTH 3 DAYS Performed at [facility], [address] [place], [place] [address]    Report Status PENDING  Incomplete  Urine culture     Status: Abnormal   Collection Time: 08/07/19  6:35 PM   Specimen: Urine, Random  Result Value Ref Range Status   Specimen Description   Final    URINE, RANDOM Performed at [facility], [address] [place], [place] [address]    Special Requests   Final    NONE Performed at [facility], [address], [place], [place] [address]    Culture (A)  Final    >=100,000 COLONIES/mL GROUP B STREP(S.AGALACTIAE)ISOLATED TESTING AGAINST S. AGALACTIAE NOT ROUTINELY PERFORMED DUE TO PREDICTABILITY OF AMP/PEN/VAN SUSCEPTIBILITY. Performed at [facility], [place] [address] [place], [place] [address]    Report Status 08/09/2019 FINAL  Final  SARS Coronavirus 2 [facility] order, Performed in [facility] hospital lab) Nasopharyngeal Nasopharyngeal Swab     Status: None   Collection Time: 08/07/19  6:35 PM   Specimen: Nasopharyngeal Swab  Result Value Ref Range Status   SARS Coronavirus 2 NEGATIVE NEGATIVE Final    Comment: (NOTE) If result is NEGATIVE SARS-CoV-2 target nucleic acids are NOT DETECTED. The SARS-CoV-2 RNA is generally detectable in upper and lower  respiratory specimens during the acute phase of infection. The lowest  concentration of SARS-CoV-2 viral copies this assay can detect is 250  copies / mL. A negative result does not  preclude SARS-CoV-2 infection  and should not be used as the sole basis for treatment or other  patient management decisions.  A negative result may occur with  improper specimen collection / handling, submission of specimen other  than nasopharyngeal swab, presence of viral mutation(s) within the  areas targeted by this assay, and inadequate number of viral copies  (<250 copies / mL). A negative result must be combined with clinical  observations, patient history, and epidemiological information. If result is POSITIVE SARS-CoV-2 target nucleic acids are DETECTED. The SARS-CoV-2 RNA is generally detectable in upper and lower  respiratory specimens dur ing the acute phase of infection.  Positive  results are indicative of active infection with SARS-CoV-2.  Clinical  correlation with patient history and other diagnostic information is  necessary to determine patient infection status.  Positive results do  not rule out bacterial infection or co-infection with other viruses. If result is PRESUMPTIVE POSTIVE SARS-CoV-2 nucleic acids MAY BE PRESENT.   A presumptive positive result was obtained on the submitted specimen  and confirmed on repeat testing.  While 2019 novel coronavirus  (SARS-CoV-2) nucleic acids may be present in the submitted sample  additional confirmatory testing may be necessary for epidemiological  and / or clinical management purposes  to differentiate between  SARS-CoV-2 and other Sarbecovirus currently known to infect humans.  If clinically indicated additional testing with an alternate test  methodology [id number]) is advised. The SARS-CoV-2 RNA is generally  detectable in upper and lower respiratory sp ecimens during the acute  phase of infection. The expected result is Negative. Fact Sheet for Patients:  StrictlyIdeas.no Fact Sheet for Healthcare Providers: BankingDealers.co.za This test is not yet approved or cleared by  the [place] FDA and has been authorized for detection and/or diagnosis of SARS-CoV-2 by FDA under an  Emergency Use Authorization (EUA).  This EUA will remain in effect (meaning this test can be used) for the duration of the COVID-19 declaration under Section 564(b)(1) of the Act, 21 U.S.C. section 360bbb-3(b)(1), unless the authorization is terminated or revoked sooner. Performed at [facility], [address] [place], [place] [address]     RADIOLOGY:  No results found.   Management plans discussed with the patient, family and they are in agreement.  CODE STATUS: DNR   TOTAL TIME TAKING CARE OF THIS PATIENT: 37 minutes.    Jay Mathis M.D on 08/10/2019 at 1:18 PM  Between 7am to 6pm - Pager - [phone]  After 6pm go to [email or web address] - Proofreader  [facility] [place] [facility]  Office  [phone]  CC: Primary care physician; [name], MD   Note: This dictation was prepared with Dragon dictation along with smaller phrase technology. Any transcriptional errors that result from this process are unintentional.

## 2019-08-11 ENCOUNTER — Other Ambulatory Visit: Payer: Self-pay | Admitting: *Deleted

## 2019-08-11 DIAGNOSIS — K219 Gastro-esophageal reflux disease without esophagitis: Secondary | ICD-10-CM | POA: Diagnosis not present

## 2019-08-11 DIAGNOSIS — E785 Hyperlipidemia, unspecified: Secondary | ICD-10-CM | POA: Diagnosis not present

## 2019-08-11 DIAGNOSIS — Z466 Encounter for fitting and adjustment of urinary device: Secondary | ICD-10-CM | POA: Diagnosis not present

## 2019-08-11 DIAGNOSIS — E1122 Type 2 diabetes mellitus with diabetic chronic kidney disease: Secondary | ICD-10-CM | POA: Diagnosis not present

## 2019-08-11 DIAGNOSIS — D631 Anemia in chronic kidney disease: Secondary | ICD-10-CM | POA: Diagnosis not present

## 2019-08-11 DIAGNOSIS — I2510 Atherosclerotic heart disease of native coronary artery without angina pectoris: Secondary | ICD-10-CM | POA: Diagnosis not present

## 2019-08-11 DIAGNOSIS — K4090 Unilateral inguinal hernia, without obstruction or gangrene, not specified as recurrent: Secondary | ICD-10-CM | POA: Diagnosis not present

## 2019-08-11 DIAGNOSIS — I129 Hypertensive chronic kidney disease with stage 1 through stage 4 chronic kidney disease, or unspecified chronic kidney disease: Secondary | ICD-10-CM | POA: Diagnosis not present

## 2019-08-11 DIAGNOSIS — N184 Chronic kidney disease, stage 4 (severe): Secondary | ICD-10-CM | POA: Diagnosis not present

## 2019-08-11 DIAGNOSIS — N390 Urinary tract infection, site not specified: Secondary | ICD-10-CM | POA: Diagnosis not present

## 2019-08-11 NOTE — Patient Outreach (Signed)
[facility]) Care Management  08/11/2019  Jay Mathis 05/27/1924 [id number]   Referral Date: 08/11/2019 Referral Source: Insurance Referral Reason: Recent discharge (08/10/2019) Insurance: [place] attempt # 1  Referral received from care Psychologist, prison and probation services as member was recently discharged from hospital after being admitted with UTI.  Per chart, he also has history of CAD, GERD, BPH, CKD, HLD, and controlled diabetes.  Call placed to member's listed number, which is his son Jay Mathis.  Member's identity verified.  This care manager introduced self and stated purpose of call.  [facility] care management services explained.  He report member lives alone and remains independent with ADLs.  Jay Mathis and other family visit often to check on him and bring meals when needed.  He also preps medications for the week, member adherent to medication management.    He is attempting to secure new PCP for member, has office in mind and has contact information to schedule appointment.  Member has follow up appointment with urologist within the next 2-3 weeks, son will provide transportation.  Confirms that home health has started, son denies any needs at this time.  Advised to contact this care manager/[facility] if needs should change.  Plan: RN CM will send successful outreach letter to member/family.  Will not open case at this time.   Jay Mathis, [place], MSN [place] [phone]

## 2019-08-12 LAB — CULTURE, BLOOD (ROUTINE X 2)
Culture: NO GROWTH
Culture: NO GROWTH

## 2019-08-13 DIAGNOSIS — N390 Urinary tract infection, site not specified: Secondary | ICD-10-CM | POA: Diagnosis not present

## 2019-08-13 DIAGNOSIS — D631 Anemia in chronic kidney disease: Secondary | ICD-10-CM | POA: Diagnosis not present

## 2019-08-13 DIAGNOSIS — I2510 Atherosclerotic heart disease of native coronary artery without angina pectoris: Secondary | ICD-10-CM | POA: Diagnosis not present

## 2019-08-13 DIAGNOSIS — I129 Hypertensive chronic kidney disease with stage 1 through stage 4 chronic kidney disease, or unspecified chronic kidney disease: Secondary | ICD-10-CM | POA: Diagnosis not present

## 2019-08-13 DIAGNOSIS — E1122 Type 2 diabetes mellitus with diabetic chronic kidney disease: Secondary | ICD-10-CM | POA: Diagnosis not present

## 2019-08-13 DIAGNOSIS — K4090 Unilateral inguinal hernia, without obstruction or gangrene, not specified as recurrent: Secondary | ICD-10-CM | POA: Diagnosis not present

## 2019-08-13 DIAGNOSIS — N184 Chronic kidney disease, stage 4 (severe): Secondary | ICD-10-CM | POA: Diagnosis not present

## 2019-08-13 DIAGNOSIS — K219 Gastro-esophageal reflux disease without esophagitis: Secondary | ICD-10-CM | POA: Diagnosis not present

## 2019-08-13 DIAGNOSIS — E785 Hyperlipidemia, unspecified: Secondary | ICD-10-CM | POA: Diagnosis not present

## 2019-08-13 NOTE — Telephone Encounter (Signed)
He was admitted last week and discharged. Please move his appt on 9/15 to next week. Thanks.

## 2019-08-14 DIAGNOSIS — K4090 Unilateral inguinal hernia, without obstruction or gangrene, not specified as recurrent: Secondary | ICD-10-CM | POA: Diagnosis not present

## 2019-08-14 DIAGNOSIS — E785 Hyperlipidemia, unspecified: Secondary | ICD-10-CM | POA: Diagnosis not present

## 2019-08-14 DIAGNOSIS — I2510 Atherosclerotic heart disease of native coronary artery without angina pectoris: Secondary | ICD-10-CM | POA: Diagnosis not present

## 2019-08-14 DIAGNOSIS — I129 Hypertensive chronic kidney disease with stage 1 through stage 4 chronic kidney disease, or unspecified chronic kidney disease: Secondary | ICD-10-CM | POA: Diagnosis not present

## 2019-08-14 DIAGNOSIS — D631 Anemia in chronic kidney disease: Secondary | ICD-10-CM | POA: Diagnosis not present

## 2019-08-14 DIAGNOSIS — K219 Gastro-esophageal reflux disease without esophagitis: Secondary | ICD-10-CM | POA: Diagnosis not present

## 2019-08-14 DIAGNOSIS — E1122 Type 2 diabetes mellitus with diabetic chronic kidney disease: Secondary | ICD-10-CM | POA: Diagnosis not present

## 2019-08-14 DIAGNOSIS — N390 Urinary tract infection, site not specified: Secondary | ICD-10-CM | POA: Diagnosis not present

## 2019-08-14 DIAGNOSIS — N184 Chronic kidney disease, stage 4 (severe): Secondary | ICD-10-CM | POA: Diagnosis not present

## 2019-08-20 DIAGNOSIS — D631 Anemia in chronic kidney disease: Secondary | ICD-10-CM | POA: Diagnosis not present

## 2019-08-20 DIAGNOSIS — I2510 Atherosclerotic heart disease of native coronary artery without angina pectoris: Secondary | ICD-10-CM | POA: Diagnosis not present

## 2019-08-20 DIAGNOSIS — K219 Gastro-esophageal reflux disease without esophagitis: Secondary | ICD-10-CM | POA: Diagnosis not present

## 2019-08-20 DIAGNOSIS — N390 Urinary tract infection, site not specified: Secondary | ICD-10-CM | POA: Diagnosis not present

## 2019-08-20 DIAGNOSIS — K4090 Unilateral inguinal hernia, without obstruction or gangrene, not specified as recurrent: Secondary | ICD-10-CM | POA: Diagnosis not present

## 2019-08-20 DIAGNOSIS — E1122 Type 2 diabetes mellitus with diabetic chronic kidney disease: Secondary | ICD-10-CM | POA: Diagnosis not present

## 2019-08-20 DIAGNOSIS — E785 Hyperlipidemia, unspecified: Secondary | ICD-10-CM | POA: Diagnosis not present

## 2019-08-20 DIAGNOSIS — I129 Hypertensive chronic kidney disease with stage 1 through stage 4 chronic kidney disease, or unspecified chronic kidney disease: Secondary | ICD-10-CM | POA: Diagnosis not present

## 2019-08-20 DIAGNOSIS — N184 Chronic kidney disease, stage 4 (severe): Secondary | ICD-10-CM | POA: Diagnosis not present

## 2019-08-21 DIAGNOSIS — I2510 Atherosclerotic heart disease of native coronary artery without angina pectoris: Secondary | ICD-10-CM | POA: Diagnosis not present

## 2019-08-21 DIAGNOSIS — K219 Gastro-esophageal reflux disease without esophagitis: Secondary | ICD-10-CM | POA: Diagnosis not present

## 2019-08-21 DIAGNOSIS — N390 Urinary tract infection, site not specified: Secondary | ICD-10-CM | POA: Diagnosis not present

## 2019-08-21 DIAGNOSIS — I129 Hypertensive chronic kidney disease with stage 1 through stage 4 chronic kidney disease, or unspecified chronic kidney disease: Secondary | ICD-10-CM | POA: Diagnosis not present

## 2019-08-21 DIAGNOSIS — N184 Chronic kidney disease, stage 4 (severe): Secondary | ICD-10-CM | POA: Diagnosis not present

## 2019-08-21 DIAGNOSIS — K4090 Unilateral inguinal hernia, without obstruction or gangrene, not specified as recurrent: Secondary | ICD-10-CM | POA: Diagnosis not present

## 2019-08-21 DIAGNOSIS — E785 Hyperlipidemia, unspecified: Secondary | ICD-10-CM | POA: Diagnosis not present

## 2019-08-21 DIAGNOSIS — D631 Anemia in chronic kidney disease: Secondary | ICD-10-CM | POA: Diagnosis not present

## 2019-08-21 DIAGNOSIS — E1122 Type 2 diabetes mellitus with diabetic chronic kidney disease: Secondary | ICD-10-CM | POA: Diagnosis not present

## 2019-08-24 DIAGNOSIS — I25810 Atherosclerosis of coronary artery bypass graft(s) without angina pectoris: Secondary | ICD-10-CM | POA: Diagnosis not present

## 2019-08-24 DIAGNOSIS — H9190 Unspecified hearing loss, unspecified ear: Secondary | ICD-10-CM | POA: Diagnosis not present

## 2019-08-24 DIAGNOSIS — N400 Enlarged prostate without lower urinary tract symptoms: Secondary | ICD-10-CM | POA: Diagnosis not present

## 2019-08-24 DIAGNOSIS — N183 Chronic kidney disease, stage 3 (moderate): Secondary | ICD-10-CM | POA: Diagnosis not present

## 2019-08-25 ENCOUNTER — Other Ambulatory Visit: Payer: Self-pay

## 2019-08-25 ENCOUNTER — Encounter: Payer: Self-pay | Admitting: General Surgery

## 2019-08-25 ENCOUNTER — Ambulatory Visit: Payer: Medicare HMO | Admitting: General Surgery

## 2019-08-25 VITALS — BP 151/67 | HR 82 | Temp 97.3°F | Ht 70.0 in | Wt 160.0 lb

## 2019-08-25 DIAGNOSIS — K4090 Unilateral inguinal hernia, without obstruction or gangrene, not specified as recurrent: Secondary | ICD-10-CM | POA: Diagnosis not present

## 2019-08-25 NOTE — Patient Instructions (Signed)
[place] to [facility] heart care. Patient son will call back to scheduled surgery

## 2019-08-26 DIAGNOSIS — I2510 Atherosclerotic heart disease of native coronary artery without angina pectoris: Secondary | ICD-10-CM | POA: Diagnosis not present

## 2019-08-26 DIAGNOSIS — N184 Chronic kidney disease, stage 4 (severe): Secondary | ICD-10-CM | POA: Diagnosis not present

## 2019-08-26 DIAGNOSIS — N390 Urinary tract infection, site not specified: Secondary | ICD-10-CM | POA: Diagnosis not present

## 2019-08-26 DIAGNOSIS — K219 Gastro-esophageal reflux disease without esophagitis: Secondary | ICD-10-CM | POA: Diagnosis not present

## 2019-08-26 DIAGNOSIS — K4090 Unilateral inguinal hernia, without obstruction or gangrene, not specified as recurrent: Secondary | ICD-10-CM | POA: Diagnosis not present

## 2019-08-26 DIAGNOSIS — E785 Hyperlipidemia, unspecified: Secondary | ICD-10-CM | POA: Diagnosis not present

## 2019-08-26 DIAGNOSIS — I129 Hypertensive chronic kidney disease with stage 1 through stage 4 chronic kidney disease, or unspecified chronic kidney disease: Secondary | ICD-10-CM | POA: Diagnosis not present

## 2019-08-26 DIAGNOSIS — D631 Anemia in chronic kidney disease: Secondary | ICD-10-CM | POA: Diagnosis not present

## 2019-08-26 DIAGNOSIS — E1122 Type 2 diabetes mellitus with diabetic chronic kidney disease: Secondary | ICD-10-CM | POA: Diagnosis not present

## 2019-08-26 NOTE — Progress Notes (Signed)
Patient ID: Jay Mathis, male   DOB: [birth year], 85 y.o.   MRN: [id number]  Chief Complaint  Patient presents with  . Follow-up    HPI Jay Mathis is a 85 y.o. male.  I saw him as a consult when he was recently in the hospital with urosepsis.  I was consulted regarding a large inguinal hernia.  My initial consult note is partially copied here:  "Jay Ohanlon Pruittis a 86 y.o.malewho presented to the emergency department last nigh complaining of feeling somewhat weak. According to patient and family he has been feeling a little bit weaker last 2 days developed a fever today. Does have history of CKD, iron deficiency anemia, CAD. Denies chest pain shortness of breath or cough. However, was noted to have a fever tonight. Does have a history of urinary tract infections. His urine looked different to the family and they were worried that he might be developing a UTI. They wanted to send him in to be better evaluated here before he got "too sick like last time". Patient denies dysuria. Does have a chronic hernia into his right inguinal region and scrotum however this is been chronic and is not different for him over the last 7 years. A CT scan was performed that confirms the presence of small bowel loops within the hernia and into the patient's scrotum.  No concern for bowel compromise.  General surgery is consulted by Dr. Stark Jock for further evaluation.  Jay Mathis confirms that the hernia has been present for at least 7 to 8 years.  He admits that it has enlarged a bit during that time, and that it is somewhat uncomfortable.  He reports having been shown in the past how to reduce it himself, and he is still able to do so.  His son states that they had been using a truss in the past, but that it was not particularly effective.  He has not used any sort of scrotal support, such as a jockstrap.  He reports some burning with urination, but denies any nausea, vomiting or inability to move his  bowels.  He was seen by general surgery (Dr. Dahlia Byes) in 2018.  At that time, he was thrombocytopenic and anemic.  He remains somewhat anemic, but his thrombocytopenia has resolved; the patient states he had been on aspirin at the time and no longer takes it."  He was found to have urinary tract infection as well as obstruction, and a Foley catheter was placed by urology.  The catheter is still in place and he he is scheduled to follow-up with Dr. Diamantina Providence on September 21.  Since his discharge, he still is not using a scrotal support or jockstrap, but he is supporting his scrotum on a rolled towel whenever he is sitting or lying down.  He says that this helps with his symptoms quite a bit.  He continues to deny any nausea or vomiting.  No difficulty with bowel movements.   Past Medical History:  Diagnosis Date  . CAD (coronary artery disease)   . CKD (chronic kidney disease) stage 4, GFR 15-29 ml/min (HCC)   . Diabetes mellitus without complication ([place])   . GERD (gastroesophageal reflux disease)   . Hyperlipidemia   . Hypertension   . Iron deficiency anemia 07/30/2017  . Nephrolithiasis     Past Surgical History:  Procedure Laterality Date  . APPENDECTOMY     60 plus years ago  . CORONARY ANGIOPLASTY WITH STENT PLACEMENT    . CORONARY  ARTERY BYPASS GRAFT      Family History  Problem Relation Age of Onset  . Prostate cancer Father   . Prostate cancer Brother     Social History Social History   Tobacco Use  . Smoking status: Former Research scientist (life sciences)  . Smokeless tobacco: Never Used  . Tobacco comment: quit about 60 years ago 2019 dated  Substance Use Topics  . Alcohol use: No  . Drug use: No    Allergies  Allergen Reactions  . Sulfa Antibiotics Rash    Current Outpatient Medications  Medication Sig Dispense Refill  . amLODipine (NORVASC) 10 MG tablet Take 10 mg by mouth daily.    Marland Kitchen aspirin EC 81 MG tablet Take 81 mg by mouth daily.    Marland Kitchen atorvastatin (LIPITOR) 10 MG tablet Take 10  mg by mouth at bedtime.     . carvedilol (COREG) 3.125 MG tablet Take 3.125 mg 2 (two) times daily by mouth.    . folic acid (FOLVITE) 1 MG tablet Take 1 tablet (1 mg total) by mouth daily. 90 tablet 4  . furosemide (LASIX) 40 MG tablet Take 40 mg by mouth daily.     Marland Kitchen glimepiride (AMARYL) 2 MG tablet Take 2 mg by mouth every morning.     . pantoprazole (PROTONIX) 40 MG tablet Take 40 mg by mouth 2 (two) times daily.    . sodium bicarbonate 650 MG tablet Take 650 mg by mouth 2 (two) times daily.    . tamsulosin (FLOMAX) 0.4 MG CAPS capsule Take 1 capsule by mouth daily.    . vitamin B-12 (CYANOCOBALAMIN) 500 MCG tablet Take 1 tablet (500 mcg total) by mouth daily. 90 tablet 3  . Vitamin D, Ergocalciferol, (DRISDOL) 50000 units CAPS capsule Take 50,000 Units by mouth every 7 (seven) days.     No current facility-administered medications for this visit.     Review of Systems Review of Systems  All other systems reviewed and are negative.   Blood pressure (!) 151/67, pulse 82, temperature (!) 97.3 F (36.3 C), temperature source Skin, height 5\' 10"  (1.778 m), weight 160 lb (72.6 kg), SpO2 96 %.  Physical Exam Physical Exam Exam conducted with a chaperone present.  Constitutional:      General: He is not in acute distress.    Appearance: Normal appearance.  HENT:     Head: Normocephalic and atraumatic.     Nose:     Comments: Wearing a mask secondary to COVID-19 precautions    Mouth/Throat:     Comments: Wearing a mask secondary to COVID-19 precautions Eyes:     General: No scleral icterus.       Right eye: No discharge.        Left eye: No discharge.     Comments: Wearing glasses  Neck:     Musculoskeletal: Normal range of motion. No neck rigidity.  Cardiovascular:     Rate and Rhythm: Normal rate and regular rhythm.     Heart sounds: Murmur present.     Comments: 3/6 systolic ejection murmur, heard best in the right second intercostal space. Pulmonary:     Effort:  Pulmonary effort is normal.     Breath sounds: Normal breath sounds.  Abdominal:     General: Abdomen is flat.     Palpations: Abdomen is soft.     Hernia: A hernia is present. Hernia is present in the right inguinal area.  Genitourinary:      Comments: There is a large right  inguinal hernia with loops of bowel descending into the scrotum.  There is a catheter in the penis. Musculoskeletal:        General: No swelling or tenderness.  Lymphadenopathy:     Cervical: No cervical adenopathy.  Skin:    General: Skin is warm and dry.  Neurological:     General: No focal deficit present.     Mental Status: He is alert. Mental status is at baseline.  Psychiatric:        Behavior: Behavior normal.     Data Reviewed I reviewed Dr.Sninsky's progress note from the patient's hospitalization, including the plan for reevaluation of the patient's bladder management.  He had placed a Foley catheter while Jay Mathis was in the hospital and had return of purulent yellow urine. I also reviewed the results from the urine c culture obtained with the patient was in the hospital.  This showed greater than 100,000 colony forming units of group B strep.  Assessment This is a 85 year old man who was admitted to the hospital with urosepsis.  He has a right inguinal hernia that is been present for many years, but is becoming increasingly symptomatic.  He is very interested in surgical repair.  I discussed with both the patient and his son, the need for him to be free of infection prior to placing a plastic mesh.  I recommended that they purchase a scrotal support and continue elevating the scrotum when he is at rest.  On my examination today, I heard a 3 out of 6 systolic ejection murmur.  Due to the patient's advanced age and prior cardiac history, I would like this further evaluated by cardiology prior to proceeding with any operative intervention.  Plan We have placed a referral to cardiology.  I will await  the results of Jay Mathis's clinic visit in urology, prior to scheduling surgery.  We will have Jay Mathis return to clinic in advance of his operation for an update of his history and physical.    Fredirick Maudlin 08/26/2019, 1:01 PM

## 2019-08-27 DIAGNOSIS — K219 Gastro-esophageal reflux disease without esophagitis: Secondary | ICD-10-CM | POA: Diagnosis not present

## 2019-08-27 DIAGNOSIS — I2510 Atherosclerotic heart disease of native coronary artery without angina pectoris: Secondary | ICD-10-CM | POA: Diagnosis not present

## 2019-08-27 DIAGNOSIS — N184 Chronic kidney disease, stage 4 (severe): Secondary | ICD-10-CM | POA: Diagnosis not present

## 2019-08-27 DIAGNOSIS — E785 Hyperlipidemia, unspecified: Secondary | ICD-10-CM | POA: Diagnosis not present

## 2019-08-27 DIAGNOSIS — D631 Anemia in chronic kidney disease: Secondary | ICD-10-CM | POA: Diagnosis not present

## 2019-08-27 DIAGNOSIS — N390 Urinary tract infection, site not specified: Secondary | ICD-10-CM | POA: Diagnosis not present

## 2019-08-27 DIAGNOSIS — E1122 Type 2 diabetes mellitus with diabetic chronic kidney disease: Secondary | ICD-10-CM | POA: Diagnosis not present

## 2019-08-27 DIAGNOSIS — K4090 Unilateral inguinal hernia, without obstruction or gangrene, not specified as recurrent: Secondary | ICD-10-CM | POA: Diagnosis not present

## 2019-08-27 DIAGNOSIS — I129 Hypertensive chronic kidney disease with stage 1 through stage 4 chronic kidney disease, or unspecified chronic kidney disease: Secondary | ICD-10-CM | POA: Diagnosis not present

## 2019-08-31 ENCOUNTER — Ambulatory Visit (INDEPENDENT_AMBULATORY_CARE_PROVIDER_SITE_OTHER): Payer: Medicare HMO | Admitting: Urology

## 2019-08-31 ENCOUNTER — Encounter: Payer: Self-pay | Admitting: Urology

## 2019-08-31 ENCOUNTER — Other Ambulatory Visit: Payer: Self-pay

## 2019-08-31 VITALS — BP 148/61 | HR 82 | Ht 71.0 in | Wt 160.0 lb

## 2019-08-31 DIAGNOSIS — R339 Retention of urine, unspecified: Secondary | ICD-10-CM

## 2019-08-31 NOTE — Progress Notes (Signed)
   08/31/2019 1:40 PM   Jay Mathis Jan 08, 1924 [id number]  Reason for visit: Follow up urinary retention  HPI: I saw Jay Mathis and his son in urology clinic today for discussion of bladder management options.  He is a comorbid 85 year old male with CAD and history of CABG, stage IV CKD with baseline creatinine 2.8 (EGFR of 18) who was recently admitted with fevers, confusion, and found to have a urinary tract infection.  CT on admission showed bilateral hydronephrosis and a distended bladder with some bladder diverticulum.  I was consulted on 08/08/2019 on the patient, and placed a catheter with return of purulent urine.  On review of his imaging, he has had bilateral hydroureteronephrosis since at least 2017, and his prostate measures 96 g.  He has had multiple recurrent UTIs in the past, and multiple hospitalizations for sepsis from urinary source.  He is tolerating the Foley well, and is draining clear yellow urine.  He denies any fevers or chills.   ROS: Please see flowsheet from today's date for complete review of systems.  Physical Exam: BP (!) 148/61   Pulse 82   Ht '5\' 11"'$  (1.803 m)   Wt 160 lb (72.6 kg)   BMI 22.32 kg/m    Urine clear yellow per Foley   Assessment & Plan:   In summary, the patient is a frail 85 year old male with atonic bladder/chronic bladder outlet obstruction with upstream hydronephrosis and CKD as well as recurrent UTIs.  We discussed bladder management options including chronic Foley with monthly changes, clean intermittent catheterization multiple times daily, and options for outlet procedures including HOLEP.  With his comorbidities and chronic obstruction, I do not think he would be a good candidate for an outlet procedure.  He does not think he would be able to perform CIC.  He still lives alone, and his sons check on him frequently.  The patient, his son, and I all jointly agreed that the best option for his bladder management to reduce his  hospitalizations, recurrent UTIs, and preserve his already poor renal function would be a chronic Foley with monthly changes.  We discussed the risks and benefits at length.  Foley exchanged today, RTC 1 year for nurse visit and Foley change RTC with me 2 months to check in on how he is tolerating the catheter, BMP prior  A total of 15 minutes were spent face-to-face with the patient, greater than 50% was spent in patient education, counseling, and coordination of care regarding urinary retention and bladder management options.  Billey Co, [facility] [address] [place], [place] [address] [phone]

## 2019-08-31 NOTE — Progress Notes (Signed)
Cath Change/ Replacement  Patient is present today for a catheter change due to urinary retention.  17ml of water was removed from the balloon, a 20FR coude foley cath was removed with out difficulty.  Patient was cleaned and prepped in a sterile fashion with betadine and 2% lidocaine jelly was instilled into the urethra. A 18 FR coude (per Dr. Diamantina Providence ok to go to a smaller size) foley cath was replaced into the bladder no complications were noted Urine return was noted 1ml and urine was clear in color. The balloon was filled with 52ml of sterile water. A night bag was attached for drainage. Patient was given proper instruction on catheter care.    Performed by: Fonnie Jarvis, CMA  Follow up1 month cath exchange

## 2019-09-01 DIAGNOSIS — E1122 Type 2 diabetes mellitus with diabetic chronic kidney disease: Secondary | ICD-10-CM | POA: Diagnosis not present

## 2019-09-01 DIAGNOSIS — K219 Gastro-esophageal reflux disease without esophagitis: Secondary | ICD-10-CM | POA: Diagnosis not present

## 2019-09-01 DIAGNOSIS — E785 Hyperlipidemia, unspecified: Secondary | ICD-10-CM | POA: Diagnosis not present

## 2019-09-01 DIAGNOSIS — I129 Hypertensive chronic kidney disease with stage 1 through stage 4 chronic kidney disease, or unspecified chronic kidney disease: Secondary | ICD-10-CM | POA: Diagnosis not present

## 2019-09-01 DIAGNOSIS — K4090 Unilateral inguinal hernia, without obstruction or gangrene, not specified as recurrent: Secondary | ICD-10-CM | POA: Diagnosis not present

## 2019-09-01 DIAGNOSIS — N390 Urinary tract infection, site not specified: Secondary | ICD-10-CM | POA: Diagnosis not present

## 2019-09-01 DIAGNOSIS — D631 Anemia in chronic kidney disease: Secondary | ICD-10-CM | POA: Diagnosis not present

## 2019-09-01 DIAGNOSIS — N184 Chronic kidney disease, stage 4 (severe): Secondary | ICD-10-CM | POA: Diagnosis not present

## 2019-09-01 DIAGNOSIS — I2510 Atherosclerotic heart disease of native coronary artery without angina pectoris: Secondary | ICD-10-CM | POA: Diagnosis not present

## 2019-09-03 DIAGNOSIS — N184 Chronic kidney disease, stage 4 (severe): Secondary | ICD-10-CM | POA: Diagnosis not present

## 2019-09-03 DIAGNOSIS — K4090 Unilateral inguinal hernia, without obstruction or gangrene, not specified as recurrent: Secondary | ICD-10-CM | POA: Diagnosis not present

## 2019-09-03 DIAGNOSIS — E1122 Type 2 diabetes mellitus with diabetic chronic kidney disease: Secondary | ICD-10-CM | POA: Diagnosis not present

## 2019-09-03 DIAGNOSIS — I2510 Atherosclerotic heart disease of native coronary artery without angina pectoris: Secondary | ICD-10-CM | POA: Diagnosis not present

## 2019-09-03 DIAGNOSIS — K219 Gastro-esophageal reflux disease without esophagitis: Secondary | ICD-10-CM | POA: Diagnosis not present

## 2019-09-03 DIAGNOSIS — N390 Urinary tract infection, site not specified: Secondary | ICD-10-CM | POA: Diagnosis not present

## 2019-09-03 DIAGNOSIS — I129 Hypertensive chronic kidney disease with stage 1 through stage 4 chronic kidney disease, or unspecified chronic kidney disease: Secondary | ICD-10-CM | POA: Diagnosis not present

## 2019-09-03 DIAGNOSIS — E785 Hyperlipidemia, unspecified: Secondary | ICD-10-CM | POA: Diagnosis not present

## 2019-09-03 DIAGNOSIS — D631 Anemia in chronic kidney disease: Secondary | ICD-10-CM | POA: Diagnosis not present

## 2019-09-04 ENCOUNTER — Encounter: Payer: Self-pay | Admitting: Cardiology

## 2019-09-04 ENCOUNTER — Other Ambulatory Visit: Payer: Self-pay

## 2019-09-04 ENCOUNTER — Ambulatory Visit (INDEPENDENT_AMBULATORY_CARE_PROVIDER_SITE_OTHER): Payer: Medicare HMO | Admitting: Cardiology

## 2019-09-04 VITALS — BP 136/50 | HR 74 | Ht 71.0 in | Wt 161.0 lb

## 2019-09-04 DIAGNOSIS — E1122 Type 2 diabetes mellitus with diabetic chronic kidney disease: Secondary | ICD-10-CM | POA: Diagnosis not present

## 2019-09-04 DIAGNOSIS — N390 Urinary tract infection, site not specified: Secondary | ICD-10-CM | POA: Diagnosis not present

## 2019-09-04 DIAGNOSIS — R600 Localized edema: Secondary | ICD-10-CM

## 2019-09-04 DIAGNOSIS — R011 Cardiac murmur, unspecified: Secondary | ICD-10-CM | POA: Diagnosis not present

## 2019-09-04 DIAGNOSIS — Z951 Presence of aortocoronary bypass graft: Secondary | ICD-10-CM

## 2019-09-04 DIAGNOSIS — K4090 Unilateral inguinal hernia, without obstruction or gangrene, not specified as recurrent: Secondary | ICD-10-CM | POA: Diagnosis not present

## 2019-09-04 DIAGNOSIS — K219 Gastro-esophageal reflux disease without esophagitis: Secondary | ICD-10-CM | POA: Diagnosis not present

## 2019-09-04 DIAGNOSIS — E785 Hyperlipidemia, unspecified: Secondary | ICD-10-CM | POA: Diagnosis not present

## 2019-09-04 DIAGNOSIS — D631 Anemia in chronic kidney disease: Secondary | ICD-10-CM | POA: Diagnosis not present

## 2019-09-04 DIAGNOSIS — N184 Chronic kidney disease, stage 4 (severe): Secondary | ICD-10-CM | POA: Diagnosis not present

## 2019-09-04 DIAGNOSIS — Z01810 Encounter for preprocedural cardiovascular examination: Secondary | ICD-10-CM

## 2019-09-04 DIAGNOSIS — I129 Hypertensive chronic kidney disease with stage 1 through stage 4 chronic kidney disease, or unspecified chronic kidney disease: Secondary | ICD-10-CM | POA: Diagnosis not present

## 2019-09-04 DIAGNOSIS — I2510 Atherosclerotic heart disease of native coronary artery without angina pectoris: Secondary | ICD-10-CM | POA: Diagnosis not present

## 2019-09-04 NOTE — Progress Notes (Signed)
Cardiology Office Note:    Date:  09/04/2019   ID:  MILBERT DRESSLER, DOB 06/11/1924, MRN [id number]  PCP:  [name], MD  Cardiologist:  No primary care provider on file.  Electrophysiologist:  None   Referring MD: Fredirick Maudlin, MD   Chief Complaint  Patient presents with  . New Patient (Initial Visit)    referred by Bearl Mulberry for right inguinal hernia. Meds reviewed verbally with patient.     History of Present Illness:   Patient was in room with his son on my evaluation today.  Jay Mathis is a 85 y.o. male with a hx of hypertension, CKD, CAD/CABG x4 about 33 years ago at [place] who presents for a preop evaluation prior to right inguinal hernia surgery.  Patient walks around at home with a wheelchair, denies chest pain or shortness of breath.  He is a former smoker for about 20 years.  He was recently admitted for a UTI.  On discharge he was placed with a urinary catheter and was told it may be permanent.  He has a follow-up with urology.  He is on Lasix 40 mg every day, states having some lower extremity edema.  He has been told he has a heart murmur.  Patient is being followed with a right inguinal hernia and repair is being planned.  He stopped taking aspirin 81 mg due to easy bruising.  Past Medical History:  Diagnosis Date  . CAD (coronary artery disease)   . CKD (chronic kidney disease) stage 4, GFR 15-29 ml/min (HCC)   . Diabetes mellitus without complication ([place])   . GERD (gastroesophageal reflux disease)   . Hyperlipidemia   . Hypertension   . Iron deficiency anemia 07/30/2017  . Nephrolithiasis     Past Surgical History:  Procedure Laterality Date  . APPENDECTOMY     60 plus years ago  . CORONARY ANGIOPLASTY WITH STENT PLACEMENT    . CORONARY ARTERY BYPASS GRAFT      Current Medications: Current Meds  Medication Sig  . amLODipine (NORVASC) 10 MG tablet Take 10 mg by mouth daily.  Marland Kitchen aspirin EC 81 MG tablet Take 81 mg by mouth daily.  Marland Kitchen  atorvastatin (LIPITOR) 10 MG tablet Take 10 mg by mouth at bedtime.   . carvedilol (COREG) 3.125 MG tablet Take 3.125 mg 2 (two) times daily by mouth.  . folic acid (FOLVITE) 1 MG tablet Take 1 tablet (1 mg total) by mouth daily.  . furosemide (LASIX) 40 MG tablet Take 40 mg by mouth daily.   Marland Kitchen glimepiride (AMARYL) 2 MG tablet Take 2 mg by mouth every morning.   . pantoprazole (PROTONIX) 40 MG tablet Take 40 mg by mouth 2 (two) times daily.  . sodium bicarbonate 650 MG tablet Take 650 mg by mouth 2 (two) times daily.  . tamsulosin (FLOMAX) 0.4 MG CAPS capsule Take 1 capsule by mouth daily.  . vitamin B-12 (CYANOCOBALAMIN) 500 MCG tablet Take 1 tablet (500 mcg total) by mouth daily.  . Vitamin D, Ergocalciferol, (DRISDOL) 50000 units CAPS capsule Take 50,000 Units by mouth every 7 (seven) days.     Allergies:   Sulfa antibiotics   Social History   Socioeconomic History  . Marital status: Married    Spouse name: Not on file  . Number of children: Not on file  . Years of education: Not on file  . Highest education level: Not on file  Occupational History  . Not on file  Social  Needs  . Financial resource strain: Not on file  . Food insecurity    Worry: Not on file    Inability: Not on file  . Transportation needs    Medical: Not on file    Non-medical: Not on file  Tobacco Use  . Smoking status: Former Research scientist (life sciences)  . Smokeless tobacco: Never Used  . Tobacco comment: quit about 60 years ago 2019 dated  Substance and Sexual Activity  . Alcohol use: No  . Drug use: No  . Sexual activity: Not on file  Lifestyle  . Physical activity    Days per week: Not on file    Minutes per session: Not on file  . Stress: Not on file  Relationships  . Social Herbalist on phone: Not on file    Gets together: Not on file    Attends religious service: Not on file    Active member of club or organization: Not on file    Attends meetings of clubs or organizations: Not on file     Relationship status: Not on file  Other Topics Concern  . Not on file  Social History Narrative  . Not on file     Family History: The patient's family history includes Prostate cancer in his brother and father.  ROS:   Please see the history of present illness.     All other systems reviewed and are negative.  EKGs/Labs/Other Studies Reviewed:    The following studies were reviewed today:   EKG:  EKG is  ordered today.  The ekg ordered today demonstrates sinus rhythm, possible old anterior lateral infarct, premature SVC's.  Recent Labs: 08/07/2019: ALT 17 08/10/2019: BUN 41; Creatinine, Ser 2.38; Hemoglobin 8.4; Magnesium 1.8; Platelets 290; Potassium 3.7; Sodium 136  Recent Lipid Panel No results found for: CHOL, TRIG, HDL, CHOLHDL, VLDL, LDLCALC, LDLDIRECT  Physical Exam:    VS:  BP (!) 136/50 (BP Location: Right Arm, Patient Position: Sitting, Cuff Size: Normal)   Pulse 74   Ht 5\' 11"  (1.803 m)   Wt 161 lb (73 kg)   BMI 22.45 kg/m     Wt Readings from Last 3 Encounters:  09/04/19 161 lb (73 kg)  08/31/19 160 lb (72.6 kg)  08/25/19 160 lb (72.6 kg)     GEN: Elderly male, well nourished, well developed in no acute distress HEENT: Normal NECK: No JVD; No carotid bruits LYMPHATICS: No lymphadenopathy CARDIAC: RRR, 3/6 systolic murmur right upper sternal border.  No rubs or gallops. RESPIRATORY:  Clear to auscultation anteriorly, wheezing or rhonchi  ABDOMEN: Soft, non-tender, non-distended MUSCULOSKELETAL:  1+ edema; No deformity  SKIN: Warm and dry NEUROLOGIC:  Alert and oriented x 3 PSYCHIATRIC:  Normal affect   ASSESSMENT:   The Revised Cardiac Risk Index indicates that his Perioperative Risk of Major Cardiac Event is (%): 6.6.  Therefore, he is at high risk for perioperative complications.  We will need to order further testing to evaluate patient's cardiac function. Patient's functional capacity is appropriate for age as he walks around with a walker with no  symptoms of chest pain or shortness of breath.   1. Preprocedural cardiovascular examination   2. Hx of CABG   3. Heart murmur   4. Bilateral leg edema    PLAN:     1. We will obtain a Lexiscan myocardial perfusion imaging test.  Continue Coreg and statin. 2. Get echocardiogram to evaluate for possible cardiomyopathy and also valvular disease in light of heart  murmur. 3. Lower extremity edema has a component of dependence.  Patient counseled on compression stockings and leg raising while in a seated position.  Follow-up after test.    Medication Adjustments/Labs and Tests Ordered: Current medicines are reviewed at length with the patient today.  Concerns regarding medicines are outlined above.  Orders Placed This Encounter  Procedures  . NM Myocar Multi W/Spect W/Wall Motion / EF  . EKG 12-Lead  . ECHOCARDIOGRAM COMPLETE   No orders of the defined types were placed in this encounter.   Patient Instructions  Medication Instructions:  Your physician recommends that you continue on your current medications as directed. Please refer to the Current Medication list given to you today.  If you need a refill on your cardiac medications before your next appointment, please call your pharmacy.   Lab work: NONE If you have labs (blood work) drawn today and your tests are completely normal, you will receive your results only by: Marland Kitchen MyChart Message (if you have MyChart) OR . A paper copy in the mail If you have any lab test that is abnormal or we need to change your treatment, we will call you to review the results.  Testing/Procedures: Your physician has requested that you have an echocardiogram. Echocardiography is a painless test that uses sound waves to create images of your heart. It provides your doctor with information about the size and shape of your heart and how well your heart's chambers and valves are working. This procedure takes approximately one hour. There are no  restrictions for this procedure. You may get an IV, if needed, to receive an ultrasound enhancing agent through to better visualize your heart.    Your physician has requested that you have a lexiscan myoview. For further information please visit [email or web address]. Please follow instruction sheet, as given.  [place]  Your caregiver has ordered a Stress Test with nuclear imaging. The purpose of this test is to evaluate the blood supply to your heart muscle. This procedure is referred to as a "Non-Invasive Stress Test." This is because other than having an IV started in your vein, nothing is inserted or "invades" your body. Cardiac stress tests are done to find areas of poor blood flow to the heart by determining the extent of coronary artery disease (CAD). Some patients exercise on a treadmill, which naturally increases the blood flow to your heart, while others who are  unable to walk on a treadmill due to physical limitations have a pharmacologic/chemical stress agent called Lexiscan . This medicine will mimic walking on a treadmill by temporarily increasing your coronary blood flow.   Please note: these test may take anywhere between 2-4 hours to complete  PLEASE REPORT TO [place] AT THE FIRST DESK WILL DIRECT YOU WHERE TO GO  Date of Procedure:_____________________________________  Arrival Time for Procedure:______________________________  Instructions regarding medication:    ____:  Hold other medications as follows:___________FUROSEMIDE_______  PLEASE NOTIFY THE OFFICE AT LEAST 24 HOURS IN ADVANCE IF YOU ARE UNABLE TO KEEP YOUR APPOINTMENT.  [phone] AND  PLEASE NOTIFY NUCLEAR MEDICINE AT [facility] AT LEAST 24 HOURS IN ADVANCE IF YOU ARE UNABLE TO KEEP YOUR APPOINTMENT. [phone]  How to prepare for your Myoview test:  1. Do not eat or drink after midnight 2. No caffeine for 24 hours prior to test 3. No smoking 24 hours prior to test.  4. Your medication may be taken with water.  If your doctor stopped a medication  because of this test, do not take that medication. 5. Ladies, please do not wear dresses.  Skirts or pants are appropriate. Please wear a short sleeve shirt. 6. No perfume, cologne or lotion. 7. Wear comfortable walking shoes.   Follow-Up: At [facility], you and your health needs are our priority.  As part of our continuing mission to provide you with exceptional heart care, we have created designated Provider Care Teams.  These Care Teams include your primary Cardiologist (physician) and Advanced Practice Providers (APPs -  Physician Assistants and Nurse Practitioners) who all work together to provide you with the care you need, when you need it. You will need a follow up appointment in [place].   You may see DR Kate Sable or one of the following Advanced Practice Providers on your designated Care Team:   Murray Hodgkins, NP Christell Faith, PA-C . Marrianne Mood, PA-C         Signed, Kate Sable, MD  09/04/2019 10:45 AM    [place]

## 2019-09-04 NOTE — Patient Instructions (Addendum)
Medication Instructions:  Your physician recommends that you continue on your current medications as directed. Please refer to the Current Medication list given to you today.  If you need a refill on your cardiac medications before your next appointment, please call your pharmacy.   Lab work: NONE If you have labs (blood work) drawn today and your tests are completely normal, you will receive your results only by: Marland Kitchen MyChart Message (if you have MyChart) OR . A paper copy in the mail If you have any lab test that is abnormal or we need to change your treatment, we will call you to review the results.  Testing/Procedures: Your physician has requested that you have an echocardiogram. Echocardiography is a painless test that uses sound waves to create images of your heart. It provides your doctor with information about the size and shape of your heart and how well your heart's chambers and valves are working. This procedure takes approximately one hour. There are no restrictions for this procedure. You may get an IV, if needed, to receive an ultrasound enhancing agent through to better visualize your heart.    Your physician has requested that you have a lexiscan myoview. For further information please visit [email or web address]. Please follow instruction sheet, as given.  Morgan  Your caregiver has ordered a Stress Test with nuclear imaging. The purpose of this test is to evaluate the blood supply to your heart muscle. This procedure is referred to as a "Non-Invasive Stress Test." This is because other than having an IV started in your vein, nothing is inserted or "invades" your body. Cardiac stress tests are done to find areas of poor blood flow to the heart by determining the extent of coronary artery disease (CAD). Some patients exercise on a treadmill, which naturally increases the blood flow to your heart, while others who are  unable to walk on a treadmill due to physical limitations  have a pharmacologic/chemical stress agent called Lexiscan . This medicine will mimic walking on a treadmill by temporarily increasing your coronary blood flow.   Please note: these test may take anywhere between 2-4 hours to complete  PLEASE REPORT TO [facility] AT THE FIRST DESK WILL DIRECT YOU WHERE TO GO  Date of Procedure:_____________________________________  Arrival Time for Procedure:______________________________  Instructions regarding medication:    ____:  Hold other medications as follows:___________FUROSEMIDE_______  PLEASE NOTIFY THE OFFICE AT LEAST 24 HOURS IN ADVANCE IF YOU ARE UNABLE TO KEEP YOUR APPOINTMENT.  [id number] AND  PLEASE NOTIFY NUCLEAR MEDICINE AT [facility] AT LEAST 24 HOURS IN ADVANCE IF YOU ARE UNABLE TO KEEP YOUR APPOINTMENT. [phone]  How to prepare for your Myoview test:  1. Do not eat or drink after midnight 2. No caffeine for 24 hours prior to test 3. No smoking 24 hours prior to test. 4. Your medication may be taken with water.  If your doctor stopped a medication because of this test, do not take that medication. 5. Ladies, please do not wear dresses.  Skirts or pants are appropriate. Please wear a short sleeve shirt. 6. No perfume, cologne or lotion. 7. Wear comfortable walking shoes.   Follow-Up: At [facility], you and your health needs are our priority.  As part of our continuing mission to provide you with exceptional heart care, we have created designated Provider Care Teams.  These Care Teams include your primary Cardiologist (physician) and Advanced Practice Providers (APPs -  Physician Assistants and Nurse Practitioners) who all  work together to provide you with the care you need, when you need it. You will need a follow up appointment in [place].   You may see DR Kate Sable or one of the following Advanced Practice Providers on your designated Care Team:   Murray Hodgkins,  NP Christell Faith, PA-C . Marrianne Mood, PA-C

## 2019-09-09 DIAGNOSIS — K219 Gastro-esophageal reflux disease without esophagitis: Secondary | ICD-10-CM | POA: Diagnosis not present

## 2019-09-09 DIAGNOSIS — D631 Anemia in chronic kidney disease: Secondary | ICD-10-CM | POA: Diagnosis not present

## 2019-09-09 DIAGNOSIS — I2510 Atherosclerotic heart disease of native coronary artery without angina pectoris: Secondary | ICD-10-CM | POA: Diagnosis not present

## 2019-09-09 DIAGNOSIS — E785 Hyperlipidemia, unspecified: Secondary | ICD-10-CM | POA: Diagnosis not present

## 2019-09-09 DIAGNOSIS — E1122 Type 2 diabetes mellitus with diabetic chronic kidney disease: Secondary | ICD-10-CM | POA: Diagnosis not present

## 2019-09-09 DIAGNOSIS — K4090 Unilateral inguinal hernia, without obstruction or gangrene, not specified as recurrent: Secondary | ICD-10-CM | POA: Diagnosis not present

## 2019-09-09 DIAGNOSIS — I129 Hypertensive chronic kidney disease with stage 1 through stage 4 chronic kidney disease, or unspecified chronic kidney disease: Secondary | ICD-10-CM | POA: Diagnosis not present

## 2019-09-09 DIAGNOSIS — N184 Chronic kidney disease, stage 4 (severe): Secondary | ICD-10-CM | POA: Diagnosis not present

## 2019-09-09 DIAGNOSIS — N390 Urinary tract infection, site not specified: Secondary | ICD-10-CM | POA: Diagnosis not present

## 2019-09-16 DIAGNOSIS — I129 Hypertensive chronic kidney disease with stage 1 through stage 4 chronic kidney disease, or unspecified chronic kidney disease: Secondary | ICD-10-CM | POA: Diagnosis not present

## 2019-09-16 DIAGNOSIS — I2510 Atherosclerotic heart disease of native coronary artery without angina pectoris: Secondary | ICD-10-CM | POA: Diagnosis not present

## 2019-09-16 DIAGNOSIS — K219 Gastro-esophageal reflux disease without esophagitis: Secondary | ICD-10-CM | POA: Diagnosis not present

## 2019-09-16 DIAGNOSIS — D631 Anemia in chronic kidney disease: Secondary | ICD-10-CM | POA: Diagnosis not present

## 2019-09-16 DIAGNOSIS — E1122 Type 2 diabetes mellitus with diabetic chronic kidney disease: Secondary | ICD-10-CM | POA: Diagnosis not present

## 2019-09-16 DIAGNOSIS — E785 Hyperlipidemia, unspecified: Secondary | ICD-10-CM | POA: Diagnosis not present

## 2019-09-16 DIAGNOSIS — N184 Chronic kidney disease, stage 4 (severe): Secondary | ICD-10-CM | POA: Diagnosis not present

## 2019-09-16 DIAGNOSIS — K4090 Unilateral inguinal hernia, without obstruction or gangrene, not specified as recurrent: Secondary | ICD-10-CM | POA: Diagnosis not present

## 2019-09-16 DIAGNOSIS — N390 Urinary tract infection, site not specified: Secondary | ICD-10-CM | POA: Diagnosis not present

## 2019-09-24 DIAGNOSIS — I2510 Atherosclerotic heart disease of native coronary artery without angina pectoris: Secondary | ICD-10-CM | POA: Diagnosis not present

## 2019-09-24 DIAGNOSIS — N390 Urinary tract infection, site not specified: Secondary | ICD-10-CM | POA: Diagnosis not present

## 2019-09-24 DIAGNOSIS — E1122 Type 2 diabetes mellitus with diabetic chronic kidney disease: Secondary | ICD-10-CM | POA: Diagnosis not present

## 2019-09-24 DIAGNOSIS — I129 Hypertensive chronic kidney disease with stage 1 through stage 4 chronic kidney disease, or unspecified chronic kidney disease: Secondary | ICD-10-CM | POA: Diagnosis not present

## 2019-09-24 DIAGNOSIS — D631 Anemia in chronic kidney disease: Secondary | ICD-10-CM | POA: Diagnosis not present

## 2019-09-24 DIAGNOSIS — N184 Chronic kidney disease, stage 4 (severe): Secondary | ICD-10-CM | POA: Diagnosis not present

## 2019-09-24 DIAGNOSIS — K4090 Unilateral inguinal hernia, without obstruction or gangrene, not specified as recurrent: Secondary | ICD-10-CM | POA: Diagnosis not present

## 2019-09-24 DIAGNOSIS — K219 Gastro-esophageal reflux disease without esophagitis: Secondary | ICD-10-CM | POA: Diagnosis not present

## 2019-09-24 DIAGNOSIS — E785 Hyperlipidemia, unspecified: Secondary | ICD-10-CM | POA: Diagnosis not present

## 2019-09-25 ENCOUNTER — Other Ambulatory Visit: Payer: Self-pay

## 2019-09-25 ENCOUNTER — Encounter
Admission: RE | Admit: 2019-09-25 | Discharge: 2019-09-25 | Disposition: A | Discharge status: Home / Self Care | Payer: Medicare HMO | Source: Ambulatory Visit | Attending: Cardiology | Admitting: Cardiology

## 2019-09-25 DIAGNOSIS — Z01810 Encounter for preprocedural cardiovascular examination: Secondary | ICD-10-CM | POA: Insufficient documentation

## 2019-09-25 DIAGNOSIS — Z951 Presence of aortocoronary bypass graft: Secondary | ICD-10-CM | POA: Insufficient documentation

## 2019-09-25 LAB — NM MYOCAR MULTI W/SPECT W/WALL MOTION / EF
Estimated workload: 1 METS
Exercise duration (min): 0 min
Exercise duration (sec): 0 s
LV dias vol: 104 mL (ref 62–150)
LV sys vol: 37 mL
MPHR: 127 {beats}/min
Peak HR: 90 {beats}/min
Percent HR: 70 %
Rest HR: 88 {beats}/min
SDS: 4
SRS: 2
SSS: 5
TID: 1

## 2019-09-25 MED ORDER — TECHNETIUM TC 99M TETROFOSMIN IV KIT
10.0000 | PACK | Freq: Once | INTRAVENOUS | Status: AC | PRN
  Administered 2019-09-25: 09:00:00 10.2 via INTRAVENOUS

## 2019-09-25 MED ORDER — REGADENOSON 0.4 MG/5ML IV SOLN
0.4000 mg | Freq: Once | INTRAVENOUS | Status: AC
  Administered 2019-09-25: 0.4 mg via INTRAVENOUS

## 2019-09-25 MED ORDER — TECHNETIUM TC 99M TETROFOSMIN IV KIT
32.4000 | PACK | Freq: Once | INTRAVENOUS | Status: AC | PRN
  Administered 2019-09-25: 32.4 via INTRAVENOUS

## 2019-09-29 DIAGNOSIS — N184 Chronic kidney disease, stage 4 (severe): Secondary | ICD-10-CM | POA: Diagnosis not present

## 2019-09-29 DIAGNOSIS — I129 Hypertensive chronic kidney disease with stage 1 through stage 4 chronic kidney disease, or unspecified chronic kidney disease: Secondary | ICD-10-CM | POA: Diagnosis not present

## 2019-09-29 DIAGNOSIS — K219 Gastro-esophageal reflux disease without esophagitis: Secondary | ICD-10-CM | POA: Diagnosis not present

## 2019-09-29 DIAGNOSIS — N390 Urinary tract infection, site not specified: Secondary | ICD-10-CM | POA: Diagnosis not present

## 2019-09-29 DIAGNOSIS — I2510 Atherosclerotic heart disease of native coronary artery without angina pectoris: Secondary | ICD-10-CM | POA: Diagnosis not present

## 2019-09-29 DIAGNOSIS — D631 Anemia in chronic kidney disease: Secondary | ICD-10-CM | POA: Diagnosis not present

## 2019-09-29 DIAGNOSIS — E785 Hyperlipidemia, unspecified: Secondary | ICD-10-CM | POA: Diagnosis not present

## 2019-09-29 DIAGNOSIS — K4090 Unilateral inguinal hernia, without obstruction or gangrene, not specified as recurrent: Secondary | ICD-10-CM | POA: Diagnosis not present

## 2019-09-29 DIAGNOSIS — E1122 Type 2 diabetes mellitus with diabetic chronic kidney disease: Secondary | ICD-10-CM | POA: Diagnosis not present

## 2019-09-30 ENCOUNTER — Other Ambulatory Visit: Payer: Self-pay | Admitting: Cardiology

## 2019-09-30 DIAGNOSIS — I129 Hypertensive chronic kidney disease with stage 1 through stage 4 chronic kidney disease, or unspecified chronic kidney disease: Secondary | ICD-10-CM | POA: Diagnosis not present

## 2019-09-30 DIAGNOSIS — N390 Urinary tract infection, site not specified: Secondary | ICD-10-CM | POA: Diagnosis not present

## 2019-09-30 DIAGNOSIS — R011 Cardiac murmur, unspecified: Secondary | ICD-10-CM

## 2019-09-30 DIAGNOSIS — D631 Anemia in chronic kidney disease: Secondary | ICD-10-CM | POA: Diagnosis not present

## 2019-09-30 DIAGNOSIS — N184 Chronic kidney disease, stage 4 (severe): Secondary | ICD-10-CM | POA: Diagnosis not present

## 2019-09-30 DIAGNOSIS — K4090 Unilateral inguinal hernia, without obstruction or gangrene, not specified as recurrent: Secondary | ICD-10-CM | POA: Diagnosis not present

## 2019-09-30 DIAGNOSIS — I2510 Atherosclerotic heart disease of native coronary artery without angina pectoris: Secondary | ICD-10-CM | POA: Diagnosis not present

## 2019-09-30 DIAGNOSIS — E1122 Type 2 diabetes mellitus with diabetic chronic kidney disease: Secondary | ICD-10-CM | POA: Diagnosis not present

## 2019-09-30 DIAGNOSIS — K219 Gastro-esophageal reflux disease without esophagitis: Secondary | ICD-10-CM | POA: Diagnosis not present

## 2019-09-30 DIAGNOSIS — E785 Hyperlipidemia, unspecified: Secondary | ICD-10-CM | POA: Diagnosis not present

## 2019-10-01 ENCOUNTER — Encounter: Payer: Self-pay | Admitting: Physician Assistant

## 2019-10-01 ENCOUNTER — Ambulatory Visit (INDEPENDENT_AMBULATORY_CARE_PROVIDER_SITE_OTHER): Payer: Medicare HMO | Admitting: Physician Assistant

## 2019-10-01 ENCOUNTER — Other Ambulatory Visit: Payer: Self-pay

## 2019-10-01 VITALS — BP 148/62 | HR 66 | Ht 71.0 in | Wt 149.0 lb

## 2019-10-01 DIAGNOSIS — N471 Phimosis: Secondary | ICD-10-CM | POA: Diagnosis not present

## 2019-10-01 DIAGNOSIS — R339 Retention of urine, unspecified: Secondary | ICD-10-CM

## 2019-10-01 NOTE — Progress Notes (Signed)
Cath Change/ Replacement  Patient is present today for a catheter change due to urinary retention.  14ml of water was removed from the balloon, a 18FR coude foley cath was removed with out difficulty.  Patient was cleaned and prepped in a sterile fashion with betadine and 2% lidocaine jelly was instilled into the urethra. A 18 FR coude foley cath was replaced into the bladder no complications were noted Urine return was noted 48ml and urine was clear yellow in color. The balloon was filled with 52ml of sterile water. A leg bag was attached for drainage.  A night bag was also given to the patient and patient was given instruction on how to change from one bag to another. Patient was given proper instruction on catheter care.    Of note, patient has an edematous foreskin with phimosis. He reports an incident that occurred during a hospitalization many years ago in which his foreskin was retracted for catheter placement and not reduced. He states it subsequently became edematous and it took him several weeks to be able to reduce it fully. I believe this is consistent with an episode of paraphimosis without penile necrosis. His foreskin is reduced today and does not require urgent intervention.  He also reports a bandage placed on his sacrum during an inpatient hospitalization approximately two months ago. He states the bandage is still in place and is bothersome to him. On physical exam, patient has a sacral pressure dressing in place. It appears saturated. I removed it at this visit. The skin underneath was intact. No breakdown visualized over the sacrum or gluteal fold.  Performed by: Debroah Loop, PA-C  Follow up: 1 month with Dr. Diamantina Providence and for Foley exchange (already scheduled).  I spent 15 min with this patient, of which greater than 50% was spent in counseling and coordination of care with the patient.   Debroah Loop, PA-C

## 2019-10-06 DIAGNOSIS — K219 Gastro-esophageal reflux disease without esophagitis: Secondary | ICD-10-CM | POA: Diagnosis not present

## 2019-10-06 DIAGNOSIS — D631 Anemia in chronic kidney disease: Secondary | ICD-10-CM | POA: Diagnosis not present

## 2019-10-06 DIAGNOSIS — E785 Hyperlipidemia, unspecified: Secondary | ICD-10-CM | POA: Diagnosis not present

## 2019-10-06 DIAGNOSIS — E1122 Type 2 diabetes mellitus with diabetic chronic kidney disease: Secondary | ICD-10-CM | POA: Diagnosis not present

## 2019-10-06 DIAGNOSIS — I2510 Atherosclerotic heart disease of native coronary artery without angina pectoris: Secondary | ICD-10-CM | POA: Diagnosis not present

## 2019-10-06 DIAGNOSIS — N184 Chronic kidney disease, stage 4 (severe): Secondary | ICD-10-CM | POA: Diagnosis not present

## 2019-10-06 DIAGNOSIS — N390 Urinary tract infection, site not specified: Secondary | ICD-10-CM | POA: Diagnosis not present

## 2019-10-06 DIAGNOSIS — I129 Hypertensive chronic kidney disease with stage 1 through stage 4 chronic kidney disease, or unspecified chronic kidney disease: Secondary | ICD-10-CM | POA: Diagnosis not present

## 2019-10-06 DIAGNOSIS — K4090 Unilateral inguinal hernia, without obstruction or gangrene, not specified as recurrent: Secondary | ICD-10-CM | POA: Diagnosis not present

## 2019-10-07 ENCOUNTER — Telehealth: Payer: Self-pay | Admitting: Urology

## 2019-10-07 DIAGNOSIS — I129 Hypertensive chronic kidney disease with stage 1 through stage 4 chronic kidney disease, or unspecified chronic kidney disease: Secondary | ICD-10-CM | POA: Diagnosis not present

## 2019-10-07 DIAGNOSIS — E785 Hyperlipidemia, unspecified: Secondary | ICD-10-CM | POA: Diagnosis not present

## 2019-10-07 DIAGNOSIS — D631 Anemia in chronic kidney disease: Secondary | ICD-10-CM | POA: Diagnosis not present

## 2019-10-07 DIAGNOSIS — N390 Urinary tract infection, site not specified: Secondary | ICD-10-CM | POA: Diagnosis not present

## 2019-10-07 DIAGNOSIS — N184 Chronic kidney disease, stage 4 (severe): Secondary | ICD-10-CM | POA: Diagnosis not present

## 2019-10-07 DIAGNOSIS — I2510 Atherosclerotic heart disease of native coronary artery without angina pectoris: Secondary | ICD-10-CM | POA: Diagnosis not present

## 2019-10-07 DIAGNOSIS — E1122 Type 2 diabetes mellitus with diabetic chronic kidney disease: Secondary | ICD-10-CM | POA: Diagnosis not present

## 2019-10-07 DIAGNOSIS — K4090 Unilateral inguinal hernia, without obstruction or gangrene, not specified as recurrent: Secondary | ICD-10-CM | POA: Diagnosis not present

## 2019-10-07 DIAGNOSIS — K219 Gastro-esophageal reflux disease without esophagitis: Secondary | ICD-10-CM | POA: Diagnosis not present

## 2019-10-07 NOTE — Telephone Encounter (Signed)
Jay Mathis from [facility] called and wants to know it would be ok if they start doing his monthly cath exchanges? He understands that they need to be done once a month. He said he wold take a verbal order over the phone. If someone can call him back @ [phone]   Thanks, Sharyn Lull

## 2019-10-08 NOTE — Telephone Encounter (Signed)
Per Dr. Diamantina Providence ok to give verbal order. Called and spoke with Merry Proud from [facility] and gave verbal order for monthly cath change 18Fr coude cath with night bag and PRN irrigation. Patient should keep apt with Dr. Versie Starks in november. Merry Proud verbalized order read back and states he will confirm with patient's son on appointment

## 2019-10-09 ENCOUNTER — Ambulatory Visit (INDEPENDENT_AMBULATORY_CARE_PROVIDER_SITE_OTHER): Payer: Medicare HMO

## 2019-10-09 ENCOUNTER — Encounter: Payer: Self-pay | Admitting: Cardiology

## 2019-10-09 ENCOUNTER — Ambulatory Visit (INDEPENDENT_AMBULATORY_CARE_PROVIDER_SITE_OTHER): Payer: Medicare HMO | Admitting: Cardiology

## 2019-10-09 ENCOUNTER — Other Ambulatory Visit: Payer: Self-pay

## 2019-10-09 VITALS — BP 140/60 | HR 68 | Ht 71.0 in | Wt 150.2 lb

## 2019-10-09 DIAGNOSIS — R011 Cardiac murmur, unspecified: Secondary | ICD-10-CM | POA: Diagnosis not present

## 2019-10-09 DIAGNOSIS — I2510 Atherosclerotic heart disease of native coronary artery without angina pectoris: Secondary | ICD-10-CM | POA: Diagnosis not present

## 2019-10-09 DIAGNOSIS — I350 Nonrheumatic aortic (valve) stenosis: Secondary | ICD-10-CM | POA: Diagnosis not present

## 2019-10-09 DIAGNOSIS — Z01818 Encounter for other preprocedural examination: Secondary | ICD-10-CM

## 2019-10-09 DIAGNOSIS — I10 Essential (primary) hypertension: Secondary | ICD-10-CM

## 2019-10-09 DIAGNOSIS — E7800 Pure hypercholesterolemia, unspecified: Secondary | ICD-10-CM | POA: Diagnosis not present

## 2019-10-09 NOTE — Progress Notes (Signed)
Cardiology Office Note:    Date:  10/09/2019   ID:  Jay Mathis, DOB Jun 27, 1924, MRN [id number]  PCP:  [name], MD  Cardiologist:  No primary care provider on file.  Electrophysiologist:  None   Referring MD: [name], MD   Chief Complaint  Patient presents with  . other    Follow up from [facility]. Needs a cardiac clearance for hernia surgery. Meds reviewed by the pt. verbally. "doing well."     History of Present Illness:   Patient was in room with his son on my evaluation today.  Jay Mathis is a 85 y.o. male with a hx of hypertension, CKD, CAD/CABG x4 about 33 years ago at [place] who presents for follow-up.    He was originally seen for a preop evaluation prior to right inguinal hernia surgery.  On physical exam he had a 3 out of 6 systolic murmur and his RCRI was 6.6%.  An echocardiogram and a Lexiscan myocardial perfusion imaging test was ordered.  He had this stress test already and his echocardiogram was performed earlier this afternoon.  Patient walks around at home with a wheelchair, denies chest pain or shortness of breath.  He is a former smoker for about 20 years.   Past Medical History:  Diagnosis Date  . CAD (coronary artery disease)   . CKD (chronic kidney disease) stage 4, GFR 15-29 ml/min (HCC)   . Diabetes mellitus without complication ([place])   . GERD (gastroesophageal reflux disease)   . Hyperlipidemia   . Hypertension   . Iron deficiency anemia 07/30/2017  . Nephrolithiasis     Past Surgical History:  Procedure Laterality Date  . APPENDECTOMY     60 plus years ago  . CORONARY ANGIOPLASTY WITH STENT PLACEMENT    . CORONARY ARTERY BYPASS GRAFT      Current Medications: Current Meds  Medication Sig  . amLODipine (NORVASC) 10 MG tablet Take 10 mg by mouth daily.  Marland Kitchen atorvastatin (LIPITOR) 10 MG tablet Take 10 mg by mouth at bedtime.   . carvedilol (COREG) 3.125 MG tablet Take 3.125 mg 2 (two) times daily by mouth.  .  folic acid (FOLVITE) 1 MG tablet Take 1 tablet (1 mg total) by mouth daily.  . furosemide (LASIX) 40 MG tablet Take 40 mg by mouth daily.   Marland Kitchen glimepiride (AMARYL) 2 MG tablet Take 2 mg by mouth every morning.   . pantoprazole (PROTONIX) 40 MG tablet Take 40 mg by mouth 2 (two) times daily.  . sodium bicarbonate 650 MG tablet Take 650 mg by mouth 2 (two) times daily.  . tamsulosin (FLOMAX) 0.4 MG CAPS capsule Take 1 capsule by mouth daily.  . vitamin B-12 (CYANOCOBALAMIN) 500 MCG tablet Take 1 tablet (500 mcg total) by mouth daily.     Allergies:   Sulfa antibiotics   Social History   Socioeconomic History  . Marital status: Married    Spouse name: Not on file  . Number of children: Not on file  . Years of education: Not on file  . Highest education level: Not on file  Occupational History  . Not on file  Social Needs  . Financial resource strain: Not on file  . Food insecurity    Worry: Not on file    Inability: Not on file  . Transportation needs    Medical: Not on file    Non-medical: Not on file  Tobacco Use  . Smoking status: Former Research scientist (life sciences)  .  Smokeless tobacco: Never Used  . Tobacco comment: quit about 60 years ago 2019 dated  Substance and Sexual Activity  . Alcohol use: No  . Drug use: No  . Sexual activity: Not on file  Lifestyle  . Physical activity    Days per week: Not on file    Minutes per session: Not on file  . Stress: Not on file  Relationships  . Social Herbalist on phone: Not on file    Gets together: Not on file    Attends religious service: Not on file    Active member of club or organization: Not on file    Attends meetings of clubs or organizations: Not on file    Relationship status: Not on file  Other Topics Concern  . Not on file  Social History Narrative  . Not on file     Family History: The patient's family history includes Prostate cancer in his brother and father.  ROS:   Please see the history of present illness.      All other systems reviewed and are negative.  EKGs/Labs/Other Studies Reviewed:    The following studies were reviewed today: Pharmacologic myocardial perfusion imaging test date 09/25/2019  Pharmacological myocardial perfusion imaging study with no significant  ischemia Normal wall motion, EF estimated at 52% No EKG changes concerning for ischemia at peak stress or in recovery. CT attenuation corrected images with three-vessel coronary calcification, mild aortic atherosclerosis Low risk scan  EKG:  EKG is was not ordered today.  Recent Labs: 08/07/2019: ALT 17 08/10/2019: BUN 41; Creatinine, Ser 2.38; Hemoglobin 8.4; Magnesium 1.8; Platelets 290; Potassium 3.7; Sodium 136  Recent Lipid Panel No results found for: CHOL, TRIG, HDL, CHOLHDL, VLDL, LDLCALC, LDLDIRECT  Physical Exam:    VS:  BP 140/60 (BP Location: Left Arm, Patient Position: Sitting, Cuff Size: Normal)   Pulse 68   Ht 5\' 11"  (1.803 m)   Wt 150 lb 4 oz (68.2 kg)   BMI 20.96 kg/m     Wt Readings from Last 3 Encounters:  10/09/19 150 lb 4 oz (68.2 kg)  10/01/19 149 lb (67.6 kg)  09/04/19 161 lb (73 kg)     GEN: Elderly male, well nourished, well developed in no acute distress HEENT: Normal NECK: No JVD; No carotid bruits LYMPHATICS: No lymphadenopathy CARDIAC: RRR, 3/6 systolic murmur right upper sternal border.  No rubs or gallops. RESPIRATORY:  Clear to auscultation anteriorly, wheezing or rhonchi  ABDOMEN: Soft, non-tender, non-distended MUSCULOSKELETAL:  1+ edema; No deformity  SKIN: Warm and dry NEUROLOGIC:  Alert and oriented x 3 PSYCHIATRIC:  Normal affect   ASSESSMENT:   His pharmacologic myocardial perfusion imaging stress test showed no significant ischemia, normal wall motion, normal ejection fraction.    His echocardiogram reviewed by me showed showed normal ejection fraction, pseudonormal diastolic function, mild mitral regurgitation, moderately calcified aortic valve with mild to moderate  aortic stenosis.  Patient can proceed with surgical procedure at acceptable risk.  He has no evidence of ischemia, he has mild to moderate aortic stenosis, he is ejection fraction is normal.  His chronic cardiovascular disease is adequately managed.  1. Pre-op evaluation   2. Coronary artery disease involving native coronary artery of native heart without angina pectoris   3. Essential hypertension   4. Pure hypercholesterolemia   5. Aortic valve stenosis, etiology of cardiac valve disease unspecified    PLAN:    1. Patient can proceed with procedure from a cardiovascular perspective.  2. History of CAD.  Continue Coreg and statin. 3. Blood pressure well controlled.  Continue current blood pressure meds. 4. Continue statin for hyperlipidemia. 5. Mild to moderate aortic stenosis.  Plan for repeat echocardiogram in about 1 year.   Follow-up in 12 months  Total encounter time more than 40 minutes  Greater than 50% was spent in counseling and coordination of care with the patient   Medication Adjustments/Labs and Tests Ordered: Current medicines are reviewed at length with the patient today.  Concerns regarding medicines are outlined above.  Orders Placed This Encounter  Procedures  . ECHOCARDIOGRAM COMPLETE   No orders of the defined types were placed in this encounter.   There are no Patient Instructions on file for this visit.   Signed, Kate Sable, MD  10/09/2019 4:09 PM     [facility]

## 2019-10-09 NOTE — Patient Instructions (Signed)
Medication Instructions:  Your physician recommends that you continue on your current medications as directed. Please refer to the Current Medication list given to you today.  *If you need a refill on your cardiac medications before your next appointment, please call your pharmacy*  Lab Work: None ordered If you have labs (blood work) drawn today and your tests are completely normal, you will receive your results only by: Marland Kitchen MyChart Message (if you have MyChart) OR . A paper copy in the mail If you have any lab test that is abnormal or we need to change your treatment, we will call you to review the results.  Testing/Procedures: Your physician has requested that you have an echocardiogram. Echocardiography is a painless test that uses sound waves to create images of your heart. It provides your doctor with information about the size and shape of your heart and how well your heart's chambers and valves are working. This procedure takes approximately one hour. There are no restrictions for this procedure. (To be scheduled in October 2021)  Follow-Up: At [facility], you and your health needs are our priority.  As part of our continuing mission to provide you with exceptional heart care, we have created designated Provider Care Teams.  These Care Teams include your primary Cardiologist (physician) and Advanced Practice Providers (APPs -  Physician Assistants and Nurse Practitioners) who all work together to provide you with the care you need, when you need it.  Your next appointment:   12 months  The format for your next appointment:   In Person  Provider:    You may see  Dr. Garen Lah or one of the following Advanced Practice Providers on your designated Care Team:    Murray Hodgkins, NP  Christell Faith, PA-C  Marrianne Mood, PA-C   Other Instructions N/A

## 2019-10-13 DIAGNOSIS — Z466 Encounter for fitting and adjustment of urinary device: Secondary | ICD-10-CM | POA: Diagnosis not present

## 2019-10-13 DIAGNOSIS — I2510 Atherosclerotic heart disease of native coronary artery without angina pectoris: Secondary | ICD-10-CM | POA: Diagnosis not present

## 2019-10-13 DIAGNOSIS — K219 Gastro-esophageal reflux disease without esophagitis: Secondary | ICD-10-CM | POA: Diagnosis not present

## 2019-10-13 DIAGNOSIS — D631 Anemia in chronic kidney disease: Secondary | ICD-10-CM | POA: Diagnosis not present

## 2019-10-13 DIAGNOSIS — E1122 Type 2 diabetes mellitus with diabetic chronic kidney disease: Secondary | ICD-10-CM | POA: Diagnosis not present

## 2019-10-13 DIAGNOSIS — N184 Chronic kidney disease, stage 4 (severe): Secondary | ICD-10-CM | POA: Diagnosis not present

## 2019-10-13 DIAGNOSIS — E785 Hyperlipidemia, unspecified: Secondary | ICD-10-CM | POA: Diagnosis not present

## 2019-10-13 DIAGNOSIS — K4090 Unilateral inguinal hernia, without obstruction or gangrene, not specified as recurrent: Secondary | ICD-10-CM | POA: Diagnosis not present

## 2019-10-13 DIAGNOSIS — I129 Hypertensive chronic kidney disease with stage 1 through stage 4 chronic kidney disease, or unspecified chronic kidney disease: Secondary | ICD-10-CM | POA: Diagnosis not present

## 2019-10-15 DIAGNOSIS — I129 Hypertensive chronic kidney disease with stage 1 through stage 4 chronic kidney disease, or unspecified chronic kidney disease: Secondary | ICD-10-CM | POA: Diagnosis not present

## 2019-10-15 DIAGNOSIS — E1122 Type 2 diabetes mellitus with diabetic chronic kidney disease: Secondary | ICD-10-CM | POA: Diagnosis not present

## 2019-10-15 DIAGNOSIS — I2510 Atherosclerotic heart disease of native coronary artery without angina pectoris: Secondary | ICD-10-CM | POA: Diagnosis not present

## 2019-10-15 DIAGNOSIS — N184 Chronic kidney disease, stage 4 (severe): Secondary | ICD-10-CM | POA: Diagnosis not present

## 2019-10-15 DIAGNOSIS — K219 Gastro-esophageal reflux disease without esophagitis: Secondary | ICD-10-CM | POA: Diagnosis not present

## 2019-10-15 DIAGNOSIS — Z466 Encounter for fitting and adjustment of urinary device: Secondary | ICD-10-CM | POA: Diagnosis not present

## 2019-10-15 DIAGNOSIS — K4090 Unilateral inguinal hernia, without obstruction or gangrene, not specified as recurrent: Secondary | ICD-10-CM | POA: Diagnosis not present

## 2019-10-15 DIAGNOSIS — D631 Anemia in chronic kidney disease: Secondary | ICD-10-CM | POA: Diagnosis not present

## 2019-10-15 DIAGNOSIS — E785 Hyperlipidemia, unspecified: Secondary | ICD-10-CM | POA: Diagnosis not present

## 2019-10-20 DIAGNOSIS — N184 Chronic kidney disease, stage 4 (severe): Secondary | ICD-10-CM | POA: Diagnosis not present

## 2019-10-20 DIAGNOSIS — E1122 Type 2 diabetes mellitus with diabetic chronic kidney disease: Secondary | ICD-10-CM | POA: Diagnosis not present

## 2019-10-20 DIAGNOSIS — D631 Anemia in chronic kidney disease: Secondary | ICD-10-CM | POA: Diagnosis not present

## 2019-10-20 DIAGNOSIS — I2510 Atherosclerotic heart disease of native coronary artery without angina pectoris: Secondary | ICD-10-CM | POA: Diagnosis not present

## 2019-10-20 DIAGNOSIS — Z466 Encounter for fitting and adjustment of urinary device: Secondary | ICD-10-CM | POA: Diagnosis not present

## 2019-10-20 DIAGNOSIS — I129 Hypertensive chronic kidney disease with stage 1 through stage 4 chronic kidney disease, or unspecified chronic kidney disease: Secondary | ICD-10-CM | POA: Diagnosis not present

## 2019-10-20 DIAGNOSIS — E785 Hyperlipidemia, unspecified: Secondary | ICD-10-CM | POA: Diagnosis not present

## 2019-10-20 DIAGNOSIS — K4090 Unilateral inguinal hernia, without obstruction or gangrene, not specified as recurrent: Secondary | ICD-10-CM | POA: Diagnosis not present

## 2019-10-20 DIAGNOSIS — K219 Gastro-esophageal reflux disease without esophagitis: Secondary | ICD-10-CM | POA: Diagnosis not present

## 2019-10-22 DIAGNOSIS — I2510 Atherosclerotic heart disease of native coronary artery without angina pectoris: Secondary | ICD-10-CM | POA: Diagnosis not present

## 2019-10-22 DIAGNOSIS — K219 Gastro-esophageal reflux disease without esophagitis: Secondary | ICD-10-CM | POA: Diagnosis not present

## 2019-10-22 DIAGNOSIS — N184 Chronic kidney disease, stage 4 (severe): Secondary | ICD-10-CM | POA: Diagnosis not present

## 2019-10-22 DIAGNOSIS — I129 Hypertensive chronic kidney disease with stage 1 through stage 4 chronic kidney disease, or unspecified chronic kidney disease: Secondary | ICD-10-CM | POA: Diagnosis not present

## 2019-10-22 DIAGNOSIS — K4090 Unilateral inguinal hernia, without obstruction or gangrene, not specified as recurrent: Secondary | ICD-10-CM | POA: Diagnosis not present

## 2019-10-22 DIAGNOSIS — E1122 Type 2 diabetes mellitus with diabetic chronic kidney disease: Secondary | ICD-10-CM | POA: Diagnosis not present

## 2019-10-22 DIAGNOSIS — E785 Hyperlipidemia, unspecified: Secondary | ICD-10-CM | POA: Diagnosis not present

## 2019-10-22 DIAGNOSIS — D631 Anemia in chronic kidney disease: Secondary | ICD-10-CM | POA: Diagnosis not present

## 2019-10-22 DIAGNOSIS — Z466 Encounter for fitting and adjustment of urinary device: Secondary | ICD-10-CM | POA: Diagnosis not present

## 2019-10-27 DIAGNOSIS — K4090 Unilateral inguinal hernia, without obstruction or gangrene, not specified as recurrent: Secondary | ICD-10-CM | POA: Diagnosis not present

## 2019-10-27 DIAGNOSIS — Z466 Encounter for fitting and adjustment of urinary device: Secondary | ICD-10-CM | POA: Diagnosis not present

## 2019-10-27 DIAGNOSIS — K219 Gastro-esophageal reflux disease without esophagitis: Secondary | ICD-10-CM | POA: Diagnosis not present

## 2019-10-27 DIAGNOSIS — D631 Anemia in chronic kidney disease: Secondary | ICD-10-CM | POA: Diagnosis not present

## 2019-10-27 DIAGNOSIS — I2510 Atherosclerotic heart disease of native coronary artery without angina pectoris: Secondary | ICD-10-CM | POA: Diagnosis not present

## 2019-10-27 DIAGNOSIS — E1122 Type 2 diabetes mellitus with diabetic chronic kidney disease: Secondary | ICD-10-CM | POA: Diagnosis not present

## 2019-10-27 DIAGNOSIS — N184 Chronic kidney disease, stage 4 (severe): Secondary | ICD-10-CM | POA: Diagnosis not present

## 2019-10-27 DIAGNOSIS — E785 Hyperlipidemia, unspecified: Secondary | ICD-10-CM | POA: Diagnosis not present

## 2019-10-27 DIAGNOSIS — I129 Hypertensive chronic kidney disease with stage 1 through stage 4 chronic kidney disease, or unspecified chronic kidney disease: Secondary | ICD-10-CM | POA: Diagnosis not present

## 2019-10-28 DIAGNOSIS — D631 Anemia in chronic kidney disease: Secondary | ICD-10-CM | POA: Diagnosis not present

## 2019-10-28 DIAGNOSIS — E1122 Type 2 diabetes mellitus with diabetic chronic kidney disease: Secondary | ICD-10-CM | POA: Diagnosis not present

## 2019-10-28 DIAGNOSIS — Z466 Encounter for fitting and adjustment of urinary device: Secondary | ICD-10-CM | POA: Diagnosis not present

## 2019-10-28 DIAGNOSIS — I2510 Atherosclerotic heart disease of native coronary artery without angina pectoris: Secondary | ICD-10-CM | POA: Diagnosis not present

## 2019-10-28 DIAGNOSIS — E785 Hyperlipidemia, unspecified: Secondary | ICD-10-CM | POA: Diagnosis not present

## 2019-10-28 DIAGNOSIS — I129 Hypertensive chronic kidney disease with stage 1 through stage 4 chronic kidney disease, or unspecified chronic kidney disease: Secondary | ICD-10-CM | POA: Diagnosis not present

## 2019-10-28 DIAGNOSIS — N184 Chronic kidney disease, stage 4 (severe): Secondary | ICD-10-CM | POA: Diagnosis not present

## 2019-10-28 DIAGNOSIS — K4090 Unilateral inguinal hernia, without obstruction or gangrene, not specified as recurrent: Secondary | ICD-10-CM | POA: Diagnosis not present

## 2019-10-28 DIAGNOSIS — K219 Gastro-esophageal reflux disease without esophagitis: Secondary | ICD-10-CM | POA: Diagnosis not present

## 2019-10-29 DIAGNOSIS — E785 Hyperlipidemia, unspecified: Secondary | ICD-10-CM | POA: Diagnosis not present

## 2019-10-29 DIAGNOSIS — I129 Hypertensive chronic kidney disease with stage 1 through stage 4 chronic kidney disease, or unspecified chronic kidney disease: Secondary | ICD-10-CM | POA: Diagnosis not present

## 2019-10-29 DIAGNOSIS — K219 Gastro-esophageal reflux disease without esophagitis: Secondary | ICD-10-CM | POA: Diagnosis not present

## 2019-10-29 DIAGNOSIS — Z466 Encounter for fitting and adjustment of urinary device: Secondary | ICD-10-CM | POA: Diagnosis not present

## 2019-10-29 DIAGNOSIS — I2510 Atherosclerotic heart disease of native coronary artery without angina pectoris: Secondary | ICD-10-CM | POA: Diagnosis not present

## 2019-10-29 DIAGNOSIS — E1122 Type 2 diabetes mellitus with diabetic chronic kidney disease: Secondary | ICD-10-CM | POA: Diagnosis not present

## 2019-10-29 DIAGNOSIS — N184 Chronic kidney disease, stage 4 (severe): Secondary | ICD-10-CM | POA: Diagnosis not present

## 2019-10-29 DIAGNOSIS — D631 Anemia in chronic kidney disease: Secondary | ICD-10-CM | POA: Diagnosis not present

## 2019-10-29 DIAGNOSIS — K4090 Unilateral inguinal hernia, without obstruction or gangrene, not specified as recurrent: Secondary | ICD-10-CM | POA: Diagnosis not present

## 2019-11-02 ENCOUNTER — Ambulatory Visit (INDEPENDENT_AMBULATORY_CARE_PROVIDER_SITE_OTHER): Payer: Medicare HMO | Admitting: Urology

## 2019-11-02 ENCOUNTER — Encounter: Payer: Self-pay | Admitting: Urology

## 2019-11-02 ENCOUNTER — Other Ambulatory Visit: Payer: Self-pay

## 2019-11-02 VITALS — BP 163/65 | HR 73

## 2019-11-02 DIAGNOSIS — N138 Other obstructive and reflux uropathy: Secondary | ICD-10-CM

## 2019-11-02 DIAGNOSIS — N390 Urinary tract infection, site not specified: Secondary | ICD-10-CM

## 2019-11-02 DIAGNOSIS — R339 Retention of urine, unspecified: Secondary | ICD-10-CM | POA: Diagnosis not present

## 2019-11-02 DIAGNOSIS — N401 Enlarged prostate with lower urinary tract symptoms: Secondary | ICD-10-CM | POA: Diagnosis not present

## 2019-11-02 NOTE — Patient Instructions (Addendum)
[phone]  Dr. Celine Ahr

## 2019-11-02 NOTE — Progress Notes (Signed)
   11/02/2019 6:53 PM   Jay Mathis [birth year] [id number]  Reason for visit: Follow up urinary retention  HPI: I saw Jay Mathis and his son back in urology clinic for discussion of bladder management options again.  He is a very comorbid 85 year old male with CAD, history of CABG, stage IV CKD with baseline creatinine 2.8 (EGFR of 18 ) who was admitted in August 2020 with sepsis secondary to a UTI.  CT at that time showed bilateral hydronephrosis and a distended bladder with some bladder diverticulum.  On review of his prior history this was chronic from at least 2017.  His prostate is very enlarged at 96 g.  He has a long history of voiding symptoms, multiple recurrent UTIs, and multiple hospitalizations for sepsis from urinary source.  We had previously decided to manage his bladder with a chronic indwelling Foley.  He is curious about other options regarding urination again today.  We discussed chronic Foley, intermittent catheterization, or an outlet procedure with fair amount of risk and the possibility that he may still be in retention despite a HoLEP.  He is also planning to undergo right inguinal hernia repair with Dr. Celine Ahr in the near future for very large inguinal hernia.  The patient is convinced that this contributes to his urinary symptoms and difficulty emptying.  We agreed to revisit the conversation in 2 months after he has potentially undergone hernia repair.  At that visit we could consider a trial of void again to see if his urination improves with the hernia fixed.  I was very honest with the patient that I do not think this will improve his urination, but and happy to try a trial of void after his hernias fixed.  Again, I would be very hesitant to operate on him with his comorbidities and other medical problems, as well as chronic bladder outlet obstruction, CKD, and frailty.  If he is extremely bothered by the catheter in 2 months and still unable to void, we can rediscuss  possibility of HOLEP.  We discussed the need to continue monthly catheter changes.  The catheter was reportedly changed by home health nurse last week.  RTC 2 months for symptom check Consider trial of void at that visit if hernia has been repaired Rediscuss HOLEP versus chronic Foley at that visit  A total of 25 minutes were spent face-to-face with the patient, greater than 50% was spent in patient education, counseling, and coordination of care regarding BPH, outlet obstruction, hernia repair, recurrent UTIs, and CKD.  Billey Co, [facility] [address], [place], [place] [address] [phone]

## 2019-11-03 DIAGNOSIS — K219 Gastro-esophageal reflux disease without esophagitis: Secondary | ICD-10-CM | POA: Diagnosis not present

## 2019-11-03 DIAGNOSIS — Z466 Encounter for fitting and adjustment of urinary device: Secondary | ICD-10-CM | POA: Diagnosis not present

## 2019-11-03 DIAGNOSIS — K4090 Unilateral inguinal hernia, without obstruction or gangrene, not specified as recurrent: Secondary | ICD-10-CM | POA: Diagnosis not present

## 2019-11-03 DIAGNOSIS — D631 Anemia in chronic kidney disease: Secondary | ICD-10-CM | POA: Diagnosis not present

## 2019-11-03 DIAGNOSIS — E1122 Type 2 diabetes mellitus with diabetic chronic kidney disease: Secondary | ICD-10-CM | POA: Diagnosis not present

## 2019-11-03 DIAGNOSIS — I2510 Atherosclerotic heart disease of native coronary artery without angina pectoris: Secondary | ICD-10-CM | POA: Diagnosis not present

## 2019-11-03 DIAGNOSIS — I129 Hypertensive chronic kidney disease with stage 1 through stage 4 chronic kidney disease, or unspecified chronic kidney disease: Secondary | ICD-10-CM | POA: Diagnosis not present

## 2019-11-03 DIAGNOSIS — N184 Chronic kidney disease, stage 4 (severe): Secondary | ICD-10-CM | POA: Diagnosis not present

## 2019-11-03 DIAGNOSIS — E785 Hyperlipidemia, unspecified: Secondary | ICD-10-CM | POA: Diagnosis not present

## 2019-11-10 DIAGNOSIS — E1122 Type 2 diabetes mellitus with diabetic chronic kidney disease: Secondary | ICD-10-CM | POA: Diagnosis not present

## 2019-11-10 DIAGNOSIS — E785 Hyperlipidemia, unspecified: Secondary | ICD-10-CM | POA: Diagnosis not present

## 2019-11-10 DIAGNOSIS — D631 Anemia in chronic kidney disease: Secondary | ICD-10-CM | POA: Diagnosis not present

## 2019-11-10 DIAGNOSIS — I2510 Atherosclerotic heart disease of native coronary artery without angina pectoris: Secondary | ICD-10-CM | POA: Diagnosis not present

## 2019-11-10 DIAGNOSIS — N184 Chronic kidney disease, stage 4 (severe): Secondary | ICD-10-CM | POA: Diagnosis not present

## 2019-11-10 DIAGNOSIS — K219 Gastro-esophageal reflux disease without esophagitis: Secondary | ICD-10-CM | POA: Diagnosis not present

## 2019-11-10 DIAGNOSIS — K4090 Unilateral inguinal hernia, without obstruction or gangrene, not specified as recurrent: Secondary | ICD-10-CM | POA: Diagnosis not present

## 2019-11-10 DIAGNOSIS — Z466 Encounter for fitting and adjustment of urinary device: Secondary | ICD-10-CM | POA: Diagnosis not present

## 2019-11-10 DIAGNOSIS — I129 Hypertensive chronic kidney disease with stage 1 through stage 4 chronic kidney disease, or unspecified chronic kidney disease: Secondary | ICD-10-CM | POA: Diagnosis not present

## 2019-11-11 DIAGNOSIS — I2510 Atherosclerotic heart disease of native coronary artery without angina pectoris: Secondary | ICD-10-CM | POA: Diagnosis not present

## 2019-11-11 DIAGNOSIS — E1122 Type 2 diabetes mellitus with diabetic chronic kidney disease: Secondary | ICD-10-CM | POA: Diagnosis not present

## 2019-11-11 DIAGNOSIS — N184 Chronic kidney disease, stage 4 (severe): Secondary | ICD-10-CM | POA: Diagnosis not present

## 2019-11-11 DIAGNOSIS — I129 Hypertensive chronic kidney disease with stage 1 through stage 4 chronic kidney disease, or unspecified chronic kidney disease: Secondary | ICD-10-CM | POA: Diagnosis not present

## 2019-11-11 DIAGNOSIS — K4090 Unilateral inguinal hernia, without obstruction or gangrene, not specified as recurrent: Secondary | ICD-10-CM | POA: Diagnosis not present

## 2019-11-11 DIAGNOSIS — K219 Gastro-esophageal reflux disease without esophagitis: Secondary | ICD-10-CM | POA: Diagnosis not present

## 2019-11-11 DIAGNOSIS — E785 Hyperlipidemia, unspecified: Secondary | ICD-10-CM | POA: Diagnosis not present

## 2019-11-11 DIAGNOSIS — D631 Anemia in chronic kidney disease: Secondary | ICD-10-CM | POA: Diagnosis not present

## 2019-11-11 DIAGNOSIS — Z466 Encounter for fitting and adjustment of urinary device: Secondary | ICD-10-CM | POA: Diagnosis not present

## 2019-11-12 DIAGNOSIS — D631 Anemia in chronic kidney disease: Secondary | ICD-10-CM | POA: Diagnosis not present

## 2019-11-12 DIAGNOSIS — N184 Chronic kidney disease, stage 4 (severe): Secondary | ICD-10-CM | POA: Diagnosis not present

## 2019-11-12 DIAGNOSIS — K219 Gastro-esophageal reflux disease without esophagitis: Secondary | ICD-10-CM | POA: Diagnosis not present

## 2019-11-12 DIAGNOSIS — I2510 Atherosclerotic heart disease of native coronary artery without angina pectoris: Secondary | ICD-10-CM | POA: Diagnosis not present

## 2019-11-12 DIAGNOSIS — E1122 Type 2 diabetes mellitus with diabetic chronic kidney disease: Secondary | ICD-10-CM | POA: Diagnosis not present

## 2019-11-12 DIAGNOSIS — K4090 Unilateral inguinal hernia, without obstruction or gangrene, not specified as recurrent: Secondary | ICD-10-CM | POA: Diagnosis not present

## 2019-11-12 DIAGNOSIS — I129 Hypertensive chronic kidney disease with stage 1 through stage 4 chronic kidney disease, or unspecified chronic kidney disease: Secondary | ICD-10-CM | POA: Diagnosis not present

## 2019-11-12 DIAGNOSIS — Z466 Encounter for fitting and adjustment of urinary device: Secondary | ICD-10-CM | POA: Diagnosis not present

## 2019-11-12 DIAGNOSIS — E785 Hyperlipidemia, unspecified: Secondary | ICD-10-CM | POA: Diagnosis not present

## 2019-11-17 DIAGNOSIS — I2510 Atherosclerotic heart disease of native coronary artery without angina pectoris: Secondary | ICD-10-CM | POA: Diagnosis not present

## 2019-11-17 DIAGNOSIS — E785 Hyperlipidemia, unspecified: Secondary | ICD-10-CM | POA: Diagnosis not present

## 2019-11-17 DIAGNOSIS — N184 Chronic kidney disease, stage 4 (severe): Secondary | ICD-10-CM | POA: Diagnosis not present

## 2019-11-17 DIAGNOSIS — K4090 Unilateral inguinal hernia, without obstruction or gangrene, not specified as recurrent: Secondary | ICD-10-CM | POA: Diagnosis not present

## 2019-11-17 DIAGNOSIS — K219 Gastro-esophageal reflux disease without esophagitis: Secondary | ICD-10-CM | POA: Diagnosis not present

## 2019-11-17 DIAGNOSIS — Z466 Encounter for fitting and adjustment of urinary device: Secondary | ICD-10-CM | POA: Diagnosis not present

## 2019-11-17 DIAGNOSIS — D631 Anemia in chronic kidney disease: Secondary | ICD-10-CM | POA: Diagnosis not present

## 2019-11-17 DIAGNOSIS — I129 Hypertensive chronic kidney disease with stage 1 through stage 4 chronic kidney disease, or unspecified chronic kidney disease: Secondary | ICD-10-CM | POA: Diagnosis not present

## 2019-11-17 DIAGNOSIS — E1122 Type 2 diabetes mellitus with diabetic chronic kidney disease: Secondary | ICD-10-CM | POA: Diagnosis not present

## 2019-11-19 DIAGNOSIS — K219 Gastro-esophageal reflux disease without esophagitis: Secondary | ICD-10-CM | POA: Diagnosis not present

## 2019-11-19 DIAGNOSIS — I129 Hypertensive chronic kidney disease with stage 1 through stage 4 chronic kidney disease, or unspecified chronic kidney disease: Secondary | ICD-10-CM | POA: Diagnosis not present

## 2019-11-19 DIAGNOSIS — E1122 Type 2 diabetes mellitus with diabetic chronic kidney disease: Secondary | ICD-10-CM | POA: Diagnosis not present

## 2019-11-19 DIAGNOSIS — D631 Anemia in chronic kidney disease: Secondary | ICD-10-CM | POA: Diagnosis not present

## 2019-11-19 DIAGNOSIS — I2510 Atherosclerotic heart disease of native coronary artery without angina pectoris: Secondary | ICD-10-CM | POA: Diagnosis not present

## 2019-11-19 DIAGNOSIS — K4090 Unilateral inguinal hernia, without obstruction or gangrene, not specified as recurrent: Secondary | ICD-10-CM | POA: Diagnosis not present

## 2019-11-19 DIAGNOSIS — E785 Hyperlipidemia, unspecified: Secondary | ICD-10-CM | POA: Diagnosis not present

## 2019-11-19 DIAGNOSIS — N184 Chronic kidney disease, stage 4 (severe): Secondary | ICD-10-CM | POA: Diagnosis not present

## 2019-11-19 DIAGNOSIS — Z466 Encounter for fitting and adjustment of urinary device: Secondary | ICD-10-CM | POA: Diagnosis not present

## 2019-11-24 DIAGNOSIS — K4090 Unilateral inguinal hernia, without obstruction or gangrene, not specified as recurrent: Secondary | ICD-10-CM | POA: Diagnosis not present

## 2019-11-24 DIAGNOSIS — I2510 Atherosclerotic heart disease of native coronary artery without angina pectoris: Secondary | ICD-10-CM | POA: Diagnosis not present

## 2019-11-24 DIAGNOSIS — Z466 Encounter for fitting and adjustment of urinary device: Secondary | ICD-10-CM | POA: Diagnosis not present

## 2019-11-24 DIAGNOSIS — D631 Anemia in chronic kidney disease: Secondary | ICD-10-CM | POA: Diagnosis not present

## 2019-11-24 DIAGNOSIS — E1122 Type 2 diabetes mellitus with diabetic chronic kidney disease: Secondary | ICD-10-CM | POA: Diagnosis not present

## 2019-11-24 DIAGNOSIS — I129 Hypertensive chronic kidney disease with stage 1 through stage 4 chronic kidney disease, or unspecified chronic kidney disease: Secondary | ICD-10-CM | POA: Diagnosis not present

## 2019-11-24 DIAGNOSIS — N184 Chronic kidney disease, stage 4 (severe): Secondary | ICD-10-CM | POA: Diagnosis not present

## 2019-11-24 DIAGNOSIS — K219 Gastro-esophageal reflux disease without esophagitis: Secondary | ICD-10-CM | POA: Diagnosis not present

## 2019-11-24 DIAGNOSIS — E785 Hyperlipidemia, unspecified: Secondary | ICD-10-CM | POA: Diagnosis not present

## 2019-11-25 DIAGNOSIS — K219 Gastro-esophageal reflux disease without esophagitis: Secondary | ICD-10-CM | POA: Diagnosis not present

## 2019-11-25 DIAGNOSIS — I129 Hypertensive chronic kidney disease with stage 1 through stage 4 chronic kidney disease, or unspecified chronic kidney disease: Secondary | ICD-10-CM | POA: Diagnosis not present

## 2019-11-25 DIAGNOSIS — E785 Hyperlipidemia, unspecified: Secondary | ICD-10-CM | POA: Diagnosis not present

## 2019-11-25 DIAGNOSIS — N184 Chronic kidney disease, stage 4 (severe): Secondary | ICD-10-CM | POA: Diagnosis not present

## 2019-11-25 DIAGNOSIS — E1122 Type 2 diabetes mellitus with diabetic chronic kidney disease: Secondary | ICD-10-CM | POA: Diagnosis not present

## 2019-11-25 DIAGNOSIS — D631 Anemia in chronic kidney disease: Secondary | ICD-10-CM | POA: Diagnosis not present

## 2019-11-25 DIAGNOSIS — I2510 Atherosclerotic heart disease of native coronary artery without angina pectoris: Secondary | ICD-10-CM | POA: Diagnosis not present

## 2019-11-25 DIAGNOSIS — K4090 Unilateral inguinal hernia, without obstruction or gangrene, not specified as recurrent: Secondary | ICD-10-CM | POA: Diagnosis not present

## 2019-11-25 DIAGNOSIS — Z466 Encounter for fitting and adjustment of urinary device: Secondary | ICD-10-CM | POA: Diagnosis not present

## 2019-11-26 DIAGNOSIS — E1122 Type 2 diabetes mellitus with diabetic chronic kidney disease: Secondary | ICD-10-CM | POA: Diagnosis not present

## 2019-11-26 DIAGNOSIS — E785 Hyperlipidemia, unspecified: Secondary | ICD-10-CM | POA: Diagnosis not present

## 2019-11-26 DIAGNOSIS — I129 Hypertensive chronic kidney disease with stage 1 through stage 4 chronic kidney disease, or unspecified chronic kidney disease: Secondary | ICD-10-CM | POA: Diagnosis not present

## 2019-11-26 DIAGNOSIS — Z466 Encounter for fitting and adjustment of urinary device: Secondary | ICD-10-CM | POA: Diagnosis not present

## 2019-11-26 DIAGNOSIS — D631 Anemia in chronic kidney disease: Secondary | ICD-10-CM | POA: Diagnosis not present

## 2019-11-26 DIAGNOSIS — I2510 Atherosclerotic heart disease of native coronary artery without angina pectoris: Secondary | ICD-10-CM | POA: Diagnosis not present

## 2019-11-26 DIAGNOSIS — K219 Gastro-esophageal reflux disease without esophagitis: Secondary | ICD-10-CM | POA: Diagnosis not present

## 2019-11-26 DIAGNOSIS — K4090 Unilateral inguinal hernia, without obstruction or gangrene, not specified as recurrent: Secondary | ICD-10-CM | POA: Diagnosis not present

## 2019-11-26 DIAGNOSIS — N184 Chronic kidney disease, stage 4 (severe): Secondary | ICD-10-CM | POA: Diagnosis not present

## 2019-12-01 DIAGNOSIS — D631 Anemia in chronic kidney disease: Secondary | ICD-10-CM | POA: Diagnosis not present

## 2019-12-01 DIAGNOSIS — I129 Hypertensive chronic kidney disease with stage 1 through stage 4 chronic kidney disease, or unspecified chronic kidney disease: Secondary | ICD-10-CM | POA: Diagnosis not present

## 2019-12-01 DIAGNOSIS — E1122 Type 2 diabetes mellitus with diabetic chronic kidney disease: Secondary | ICD-10-CM | POA: Diagnosis not present

## 2019-12-01 DIAGNOSIS — I2510 Atherosclerotic heart disease of native coronary artery without angina pectoris: Secondary | ICD-10-CM | POA: Diagnosis not present

## 2019-12-01 DIAGNOSIS — E785 Hyperlipidemia, unspecified: Secondary | ICD-10-CM | POA: Diagnosis not present

## 2019-12-01 DIAGNOSIS — K4090 Unilateral inguinal hernia, without obstruction or gangrene, not specified as recurrent: Secondary | ICD-10-CM | POA: Diagnosis not present

## 2019-12-01 DIAGNOSIS — N184 Chronic kidney disease, stage 4 (severe): Secondary | ICD-10-CM | POA: Diagnosis not present

## 2019-12-01 DIAGNOSIS — K219 Gastro-esophageal reflux disease without esophagitis: Secondary | ICD-10-CM | POA: Diagnosis not present

## 2019-12-01 DIAGNOSIS — Z466 Encounter for fitting and adjustment of urinary device: Secondary | ICD-10-CM | POA: Diagnosis not present

## 2019-12-06 DIAGNOSIS — I2510 Atherosclerotic heart disease of native coronary artery without angina pectoris: Secondary | ICD-10-CM | POA: Diagnosis not present

## 2019-12-06 DIAGNOSIS — K4090 Unilateral inguinal hernia, without obstruction or gangrene, not specified as recurrent: Secondary | ICD-10-CM | POA: Diagnosis not present

## 2019-12-06 DIAGNOSIS — Z466 Encounter for fitting and adjustment of urinary device: Secondary | ICD-10-CM | POA: Diagnosis not present

## 2019-12-06 DIAGNOSIS — K219 Gastro-esophageal reflux disease without esophagitis: Secondary | ICD-10-CM | POA: Diagnosis not present

## 2019-12-06 DIAGNOSIS — N184 Chronic kidney disease, stage 4 (severe): Secondary | ICD-10-CM | POA: Diagnosis not present

## 2019-12-06 DIAGNOSIS — D631 Anemia in chronic kidney disease: Secondary | ICD-10-CM | POA: Diagnosis not present

## 2019-12-06 DIAGNOSIS — E1122 Type 2 diabetes mellitus with diabetic chronic kidney disease: Secondary | ICD-10-CM | POA: Diagnosis not present

## 2019-12-06 DIAGNOSIS — I129 Hypertensive chronic kidney disease with stage 1 through stage 4 chronic kidney disease, or unspecified chronic kidney disease: Secondary | ICD-10-CM | POA: Diagnosis not present

## 2019-12-06 DIAGNOSIS — E785 Hyperlipidemia, unspecified: Secondary | ICD-10-CM | POA: Diagnosis not present

## 2019-12-08 ENCOUNTER — Telehealth: Payer: Self-pay | Admitting: Urology

## 2019-12-08 DIAGNOSIS — E785 Hyperlipidemia, unspecified: Secondary | ICD-10-CM | POA: Diagnosis not present

## 2019-12-08 DIAGNOSIS — N184 Chronic kidney disease, stage 4 (severe): Secondary | ICD-10-CM | POA: Diagnosis not present

## 2019-12-08 DIAGNOSIS — Z466 Encounter for fitting and adjustment of urinary device: Secondary | ICD-10-CM | POA: Diagnosis not present

## 2019-12-08 DIAGNOSIS — I129 Hypertensive chronic kidney disease with stage 1 through stage 4 chronic kidney disease, or unspecified chronic kidney disease: Secondary | ICD-10-CM | POA: Diagnosis not present

## 2019-12-08 DIAGNOSIS — I2510 Atherosclerotic heart disease of native coronary artery without angina pectoris: Secondary | ICD-10-CM | POA: Diagnosis not present

## 2019-12-08 DIAGNOSIS — K4090 Unilateral inguinal hernia, without obstruction or gangrene, not specified as recurrent: Secondary | ICD-10-CM | POA: Diagnosis not present

## 2019-12-08 DIAGNOSIS — E1122 Type 2 diabetes mellitus with diabetic chronic kidney disease: Secondary | ICD-10-CM | POA: Diagnosis not present

## 2019-12-08 DIAGNOSIS — K219 Gastro-esophageal reflux disease without esophagitis: Secondary | ICD-10-CM | POA: Diagnosis not present

## 2019-12-08 DIAGNOSIS — D631 Anemia in chronic kidney disease: Secondary | ICD-10-CM | POA: Diagnosis not present

## 2019-12-08 NOTE — Telephone Encounter (Signed)
Jay Mathis from [facility] needs a verbal order to continue foley cath changes every 2 weeks, also continue home health aide and occupational therapist to eval and treat.

## 2019-12-08 NOTE — Telephone Encounter (Signed)
Yes that's fine, thanks  Jay Madrid, MD 12/08/2019

## 2019-12-08 NOTE — Telephone Encounter (Signed)
Spoke with [employer] verbal per Dr. Diamantina Providence

## 2019-12-15 DIAGNOSIS — Z466 Encounter for fitting and adjustment of urinary device: Secondary | ICD-10-CM | POA: Diagnosis not present

## 2019-12-15 DIAGNOSIS — E785 Hyperlipidemia, unspecified: Secondary | ICD-10-CM | POA: Diagnosis not present

## 2019-12-15 DIAGNOSIS — I129 Hypertensive chronic kidney disease with stage 1 through stage 4 chronic kidney disease, or unspecified chronic kidney disease: Secondary | ICD-10-CM | POA: Diagnosis not present

## 2019-12-15 DIAGNOSIS — K4090 Unilateral inguinal hernia, without obstruction or gangrene, not specified as recurrent: Secondary | ICD-10-CM | POA: Diagnosis not present

## 2019-12-15 DIAGNOSIS — K219 Gastro-esophageal reflux disease without esophagitis: Secondary | ICD-10-CM | POA: Diagnosis not present

## 2019-12-15 DIAGNOSIS — E1122 Type 2 diabetes mellitus with diabetic chronic kidney disease: Secondary | ICD-10-CM | POA: Diagnosis not present

## 2019-12-15 DIAGNOSIS — N184 Chronic kidney disease, stage 4 (severe): Secondary | ICD-10-CM | POA: Diagnosis not present

## 2019-12-15 DIAGNOSIS — I2510 Atherosclerotic heart disease of native coronary artery without angina pectoris: Secondary | ICD-10-CM | POA: Diagnosis not present

## 2019-12-15 DIAGNOSIS — D631 Anemia in chronic kidney disease: Secondary | ICD-10-CM | POA: Diagnosis not present

## 2019-12-17 DIAGNOSIS — E785 Hyperlipidemia, unspecified: Secondary | ICD-10-CM | POA: Diagnosis not present

## 2019-12-17 DIAGNOSIS — E1122 Type 2 diabetes mellitus with diabetic chronic kidney disease: Secondary | ICD-10-CM | POA: Diagnosis not present

## 2019-12-17 DIAGNOSIS — K219 Gastro-esophageal reflux disease without esophagitis: Secondary | ICD-10-CM | POA: Diagnosis not present

## 2019-12-17 DIAGNOSIS — I2510 Atherosclerotic heart disease of native coronary artery without angina pectoris: Secondary | ICD-10-CM | POA: Diagnosis not present

## 2019-12-17 DIAGNOSIS — K4090 Unilateral inguinal hernia, without obstruction or gangrene, not specified as recurrent: Secondary | ICD-10-CM | POA: Diagnosis not present

## 2019-12-17 DIAGNOSIS — Z466 Encounter for fitting and adjustment of urinary device: Secondary | ICD-10-CM | POA: Diagnosis not present

## 2019-12-17 DIAGNOSIS — I129 Hypertensive chronic kidney disease with stage 1 through stage 4 chronic kidney disease, or unspecified chronic kidney disease: Secondary | ICD-10-CM | POA: Diagnosis not present

## 2019-12-17 DIAGNOSIS — D631 Anemia in chronic kidney disease: Secondary | ICD-10-CM | POA: Diagnosis not present

## 2019-12-17 DIAGNOSIS — N184 Chronic kidney disease, stage 4 (severe): Secondary | ICD-10-CM | POA: Diagnosis not present

## 2019-12-21 DIAGNOSIS — K4090 Unilateral inguinal hernia, without obstruction or gangrene, not specified as recurrent: Secondary | ICD-10-CM | POA: Diagnosis not present

## 2019-12-21 DIAGNOSIS — E1122 Type 2 diabetes mellitus with diabetic chronic kidney disease: Secondary | ICD-10-CM | POA: Diagnosis not present

## 2019-12-21 DIAGNOSIS — N184 Chronic kidney disease, stage 4 (severe): Secondary | ICD-10-CM | POA: Diagnosis not present

## 2019-12-21 DIAGNOSIS — D631 Anemia in chronic kidney disease: Secondary | ICD-10-CM | POA: Diagnosis not present

## 2019-12-21 DIAGNOSIS — E785 Hyperlipidemia, unspecified: Secondary | ICD-10-CM | POA: Diagnosis not present

## 2019-12-21 DIAGNOSIS — Z466 Encounter for fitting and adjustment of urinary device: Secondary | ICD-10-CM | POA: Diagnosis not present

## 2019-12-21 DIAGNOSIS — I2510 Atherosclerotic heart disease of native coronary artery without angina pectoris: Secondary | ICD-10-CM | POA: Diagnosis not present

## 2019-12-21 DIAGNOSIS — I129 Hypertensive chronic kidney disease with stage 1 through stage 4 chronic kidney disease, or unspecified chronic kidney disease: Secondary | ICD-10-CM | POA: Diagnosis not present

## 2019-12-21 DIAGNOSIS — K219 Gastro-esophageal reflux disease without esophagitis: Secondary | ICD-10-CM | POA: Diagnosis not present

## 2019-12-22 DIAGNOSIS — E1122 Type 2 diabetes mellitus with diabetic chronic kidney disease: Secondary | ICD-10-CM | POA: Diagnosis not present

## 2019-12-22 DIAGNOSIS — Z466 Encounter for fitting and adjustment of urinary device: Secondary | ICD-10-CM | POA: Diagnosis not present

## 2019-12-22 DIAGNOSIS — K219 Gastro-esophageal reflux disease without esophagitis: Secondary | ICD-10-CM | POA: Diagnosis not present

## 2019-12-22 DIAGNOSIS — N184 Chronic kidney disease, stage 4 (severe): Secondary | ICD-10-CM | POA: Diagnosis not present

## 2019-12-22 DIAGNOSIS — I129 Hypertensive chronic kidney disease with stage 1 through stage 4 chronic kidney disease, or unspecified chronic kidney disease: Secondary | ICD-10-CM | POA: Diagnosis not present

## 2019-12-22 DIAGNOSIS — I2510 Atherosclerotic heart disease of native coronary artery without angina pectoris: Secondary | ICD-10-CM | POA: Diagnosis not present

## 2019-12-22 DIAGNOSIS — D631 Anemia in chronic kidney disease: Secondary | ICD-10-CM | POA: Diagnosis not present

## 2019-12-22 DIAGNOSIS — K4090 Unilateral inguinal hernia, without obstruction or gangrene, not specified as recurrent: Secondary | ICD-10-CM | POA: Diagnosis not present

## 2019-12-22 DIAGNOSIS — E785 Hyperlipidemia, unspecified: Secondary | ICD-10-CM | POA: Diagnosis not present

## 2019-12-24 ENCOUNTER — Other Ambulatory Visit: Payer: Self-pay

## 2019-12-24 DIAGNOSIS — D509 Iron deficiency anemia, unspecified: Secondary | ICD-10-CM

## 2019-12-24 DIAGNOSIS — N184 Chronic kidney disease, stage 4 (severe): Secondary | ICD-10-CM

## 2019-12-24 DIAGNOSIS — D631 Anemia in chronic kidney disease: Secondary | ICD-10-CM

## 2019-12-24 MED ORDER — CYANOCOBALAMIN 500 MCG PO TABS: 500.0000 ug | ORAL_TABLET | Freq: Every day | ORAL | 3 refills | Status: DC

## 2019-12-25 DIAGNOSIS — E1122 Type 2 diabetes mellitus with diabetic chronic kidney disease: Secondary | ICD-10-CM | POA: Diagnosis not present

## 2019-12-25 DIAGNOSIS — Z466 Encounter for fitting and adjustment of urinary device: Secondary | ICD-10-CM | POA: Diagnosis not present

## 2019-12-25 DIAGNOSIS — E785 Hyperlipidemia, unspecified: Secondary | ICD-10-CM | POA: Diagnosis not present

## 2019-12-25 DIAGNOSIS — I129 Hypertensive chronic kidney disease with stage 1 through stage 4 chronic kidney disease, or unspecified chronic kidney disease: Secondary | ICD-10-CM | POA: Diagnosis not present

## 2019-12-25 DIAGNOSIS — I2510 Atherosclerotic heart disease of native coronary artery without angina pectoris: Secondary | ICD-10-CM | POA: Diagnosis not present

## 2019-12-25 DIAGNOSIS — K4090 Unilateral inguinal hernia, without obstruction or gangrene, not specified as recurrent: Secondary | ICD-10-CM | POA: Diagnosis not present

## 2019-12-25 DIAGNOSIS — D631 Anemia in chronic kidney disease: Secondary | ICD-10-CM | POA: Diagnosis not present

## 2019-12-25 DIAGNOSIS — N184 Chronic kidney disease, stage 4 (severe): Secondary | ICD-10-CM | POA: Diagnosis not present

## 2019-12-25 DIAGNOSIS — K219 Gastro-esophageal reflux disease without esophagitis: Secondary | ICD-10-CM | POA: Diagnosis not present

## 2019-12-29 DIAGNOSIS — K4090 Unilateral inguinal hernia, without obstruction or gangrene, not specified as recurrent: Secondary | ICD-10-CM | POA: Diagnosis not present

## 2019-12-29 DIAGNOSIS — D631 Anemia in chronic kidney disease: Secondary | ICD-10-CM | POA: Diagnosis not present

## 2019-12-29 DIAGNOSIS — K219 Gastro-esophageal reflux disease without esophagitis: Secondary | ICD-10-CM | POA: Diagnosis not present

## 2019-12-29 DIAGNOSIS — E785 Hyperlipidemia, unspecified: Secondary | ICD-10-CM | POA: Diagnosis not present

## 2019-12-29 DIAGNOSIS — I2510 Atherosclerotic heart disease of native coronary artery without angina pectoris: Secondary | ICD-10-CM | POA: Diagnosis not present

## 2019-12-29 DIAGNOSIS — I129 Hypertensive chronic kidney disease with stage 1 through stage 4 chronic kidney disease, or unspecified chronic kidney disease: Secondary | ICD-10-CM | POA: Diagnosis not present

## 2019-12-29 DIAGNOSIS — E1122 Type 2 diabetes mellitus with diabetic chronic kidney disease: Secondary | ICD-10-CM | POA: Diagnosis not present

## 2019-12-29 DIAGNOSIS — Z466 Encounter for fitting and adjustment of urinary device: Secondary | ICD-10-CM | POA: Diagnosis not present

## 2019-12-29 DIAGNOSIS — N184 Chronic kidney disease, stage 4 (severe): Secondary | ICD-10-CM | POA: Diagnosis not present

## 2019-12-31 DIAGNOSIS — K4090 Unilateral inguinal hernia, without obstruction or gangrene, not specified as recurrent: Secondary | ICD-10-CM | POA: Diagnosis not present

## 2019-12-31 DIAGNOSIS — I129 Hypertensive chronic kidney disease with stage 1 through stage 4 chronic kidney disease, or unspecified chronic kidney disease: Secondary | ICD-10-CM | POA: Diagnosis not present

## 2019-12-31 DIAGNOSIS — I2510 Atherosclerotic heart disease of native coronary artery without angina pectoris: Secondary | ICD-10-CM | POA: Diagnosis not present

## 2019-12-31 DIAGNOSIS — K219 Gastro-esophageal reflux disease without esophagitis: Secondary | ICD-10-CM | POA: Diagnosis not present

## 2019-12-31 DIAGNOSIS — E785 Hyperlipidemia, unspecified: Secondary | ICD-10-CM | POA: Diagnosis not present

## 2019-12-31 DIAGNOSIS — E1122 Type 2 diabetes mellitus with diabetic chronic kidney disease: Secondary | ICD-10-CM | POA: Diagnosis not present

## 2019-12-31 DIAGNOSIS — Z466 Encounter for fitting and adjustment of urinary device: Secondary | ICD-10-CM | POA: Diagnosis not present

## 2019-12-31 DIAGNOSIS — D631 Anemia in chronic kidney disease: Secondary | ICD-10-CM | POA: Diagnosis not present

## 2019-12-31 DIAGNOSIS — N184 Chronic kidney disease, stage 4 (severe): Secondary | ICD-10-CM | POA: Diagnosis not present

## 2020-01-05 DIAGNOSIS — Z466 Encounter for fitting and adjustment of urinary device: Secondary | ICD-10-CM | POA: Diagnosis not present

## 2020-01-05 DIAGNOSIS — E785 Hyperlipidemia, unspecified: Secondary | ICD-10-CM | POA: Diagnosis not present

## 2020-01-05 DIAGNOSIS — K219 Gastro-esophageal reflux disease without esophagitis: Secondary | ICD-10-CM | POA: Diagnosis not present

## 2020-01-05 DIAGNOSIS — E1122 Type 2 diabetes mellitus with diabetic chronic kidney disease: Secondary | ICD-10-CM | POA: Diagnosis not present

## 2020-01-05 DIAGNOSIS — I2510 Atherosclerotic heart disease of native coronary artery without angina pectoris: Secondary | ICD-10-CM | POA: Diagnosis not present

## 2020-01-05 DIAGNOSIS — I129 Hypertensive chronic kidney disease with stage 1 through stage 4 chronic kidney disease, or unspecified chronic kidney disease: Secondary | ICD-10-CM | POA: Diagnosis not present

## 2020-01-05 DIAGNOSIS — N184 Chronic kidney disease, stage 4 (severe): Secondary | ICD-10-CM | POA: Diagnosis not present

## 2020-01-05 DIAGNOSIS — K4090 Unilateral inguinal hernia, without obstruction or gangrene, not specified as recurrent: Secondary | ICD-10-CM | POA: Diagnosis not present

## 2020-01-05 DIAGNOSIS — D631 Anemia in chronic kidney disease: Secondary | ICD-10-CM | POA: Diagnosis not present

## 2020-01-06 DIAGNOSIS — Z466 Encounter for fitting and adjustment of urinary device: Secondary | ICD-10-CM | POA: Diagnosis not present

## 2020-01-06 DIAGNOSIS — E1122 Type 2 diabetes mellitus with diabetic chronic kidney disease: Secondary | ICD-10-CM | POA: Diagnosis not present

## 2020-01-06 DIAGNOSIS — D631 Anemia in chronic kidney disease: Secondary | ICD-10-CM | POA: Diagnosis not present

## 2020-01-06 DIAGNOSIS — I129 Hypertensive chronic kidney disease with stage 1 through stage 4 chronic kidney disease, or unspecified chronic kidney disease: Secondary | ICD-10-CM | POA: Diagnosis not present

## 2020-01-06 DIAGNOSIS — N184 Chronic kidney disease, stage 4 (severe): Secondary | ICD-10-CM | POA: Diagnosis not present

## 2020-01-06 DIAGNOSIS — E785 Hyperlipidemia, unspecified: Secondary | ICD-10-CM | POA: Diagnosis not present

## 2020-01-06 DIAGNOSIS — I2510 Atherosclerotic heart disease of native coronary artery without angina pectoris: Secondary | ICD-10-CM | POA: Diagnosis not present

## 2020-01-06 DIAGNOSIS — K219 Gastro-esophageal reflux disease without esophagitis: Secondary | ICD-10-CM | POA: Diagnosis not present

## 2020-01-06 DIAGNOSIS — K4090 Unilateral inguinal hernia, without obstruction or gangrene, not specified as recurrent: Secondary | ICD-10-CM | POA: Diagnosis not present

## 2020-01-07 ENCOUNTER — Ambulatory Visit: Payer: Medicare HMO | Admitting: Urology

## 2020-01-08 DIAGNOSIS — E785 Hyperlipidemia, unspecified: Secondary | ICD-10-CM | POA: Diagnosis not present

## 2020-01-08 DIAGNOSIS — E1122 Type 2 diabetes mellitus with diabetic chronic kidney disease: Secondary | ICD-10-CM | POA: Diagnosis not present

## 2020-01-08 DIAGNOSIS — N184 Chronic kidney disease, stage 4 (severe): Secondary | ICD-10-CM | POA: Diagnosis not present

## 2020-01-08 DIAGNOSIS — D631 Anemia in chronic kidney disease: Secondary | ICD-10-CM | POA: Diagnosis not present

## 2020-01-08 DIAGNOSIS — K219 Gastro-esophageal reflux disease without esophagitis: Secondary | ICD-10-CM | POA: Diagnosis not present

## 2020-01-08 DIAGNOSIS — I2510 Atherosclerotic heart disease of native coronary artery without angina pectoris: Secondary | ICD-10-CM | POA: Diagnosis not present

## 2020-01-08 DIAGNOSIS — Z466 Encounter for fitting and adjustment of urinary device: Secondary | ICD-10-CM | POA: Diagnosis not present

## 2020-01-08 DIAGNOSIS — K4090 Unilateral inguinal hernia, without obstruction or gangrene, not specified as recurrent: Secondary | ICD-10-CM | POA: Diagnosis not present

## 2020-01-08 DIAGNOSIS — I129 Hypertensive chronic kidney disease with stage 1 through stage 4 chronic kidney disease, or unspecified chronic kidney disease: Secondary | ICD-10-CM | POA: Diagnosis not present

## 2020-01-12 DIAGNOSIS — D631 Anemia in chronic kidney disease: Secondary | ICD-10-CM | POA: Diagnosis not present

## 2020-01-12 DIAGNOSIS — Z466 Encounter for fitting and adjustment of urinary device: Secondary | ICD-10-CM | POA: Diagnosis not present

## 2020-01-12 DIAGNOSIS — I2510 Atherosclerotic heart disease of native coronary artery without angina pectoris: Secondary | ICD-10-CM | POA: Diagnosis not present

## 2020-01-12 DIAGNOSIS — E785 Hyperlipidemia, unspecified: Secondary | ICD-10-CM | POA: Diagnosis not present

## 2020-01-12 DIAGNOSIS — K219 Gastro-esophageal reflux disease without esophagitis: Secondary | ICD-10-CM | POA: Diagnosis not present

## 2020-01-12 DIAGNOSIS — E1122 Type 2 diabetes mellitus with diabetic chronic kidney disease: Secondary | ICD-10-CM | POA: Diagnosis not present

## 2020-01-12 DIAGNOSIS — I129 Hypertensive chronic kidney disease with stage 1 through stage 4 chronic kidney disease, or unspecified chronic kidney disease: Secondary | ICD-10-CM | POA: Diagnosis not present

## 2020-01-12 DIAGNOSIS — K4090 Unilateral inguinal hernia, without obstruction or gangrene, not specified as recurrent: Secondary | ICD-10-CM | POA: Diagnosis not present

## 2020-01-12 DIAGNOSIS — N184 Chronic kidney disease, stage 4 (severe): Secondary | ICD-10-CM | POA: Diagnosis not present

## 2020-01-12 NOTE — Progress Notes (Deleted)
[facility]  Physical Therapy Certification  Patient Details  Name: Jay Mathis MRN: [id number] Date of Birth: 06/16/1924 Medical Diagnosis: Active Problems:   UTI (urinary tract infection)  Visit Diagnosis: PT Visit Diagnosis: Unsteadiness on feet (R26.81), Other abnormalities of gait and mobility (R26.89), Difficulty in walking, not elsewhere classified (R26.2) PT Problem: Decreased strength, Decreased mobility, Decreased balance  Goals: Patient will transfer sit to/from stand: with modified independence Pt will Ambulate: > 125 feet, with least restrictive assistive device, with modified independence Pt will Go Up / Down Stairs: 1-2 stairs, with rail(s), with modified independence  Duration: Services will be provided through the following date: 08/23/19  Frequency: Min 2X/week  Amount: one treatment session per day unless otherwise indicated.    Certification Start Date:  [id number] Certification End Date: 08/23/19   PT Treatments/Interventions: DME instruction, Therapeutic activities, Gait training, Therapeutic exercise, Patient/family education, Stair training, Balance training, Functional mobility training, Neuromuscular re-education  Neldon Shepard H. Owens Shark, PT, DPT, NCS 01/12/20, 9:39 PM [phone]

## 2020-01-14 DIAGNOSIS — I2510 Atherosclerotic heart disease of native coronary artery without angina pectoris: Secondary | ICD-10-CM | POA: Diagnosis not present

## 2020-01-14 DIAGNOSIS — N184 Chronic kidney disease, stage 4 (severe): Secondary | ICD-10-CM | POA: Diagnosis not present

## 2020-01-14 DIAGNOSIS — Z466 Encounter for fitting and adjustment of urinary device: Secondary | ICD-10-CM | POA: Diagnosis not present

## 2020-01-14 DIAGNOSIS — E785 Hyperlipidemia, unspecified: Secondary | ICD-10-CM | POA: Diagnosis not present

## 2020-01-14 DIAGNOSIS — E1122 Type 2 diabetes mellitus with diabetic chronic kidney disease: Secondary | ICD-10-CM | POA: Diagnosis not present

## 2020-01-14 DIAGNOSIS — K219 Gastro-esophageal reflux disease without esophagitis: Secondary | ICD-10-CM | POA: Diagnosis not present

## 2020-01-14 DIAGNOSIS — D631 Anemia in chronic kidney disease: Secondary | ICD-10-CM | POA: Diagnosis not present

## 2020-01-14 DIAGNOSIS — I129 Hypertensive chronic kidney disease with stage 1 through stage 4 chronic kidney disease, or unspecified chronic kidney disease: Secondary | ICD-10-CM | POA: Diagnosis not present

## 2020-01-14 DIAGNOSIS — K4090 Unilateral inguinal hernia, without obstruction or gangrene, not specified as recurrent: Secondary | ICD-10-CM | POA: Diagnosis not present

## 2020-01-19 DIAGNOSIS — E1122 Type 2 diabetes mellitus with diabetic chronic kidney disease: Secondary | ICD-10-CM | POA: Diagnosis not present

## 2020-01-19 DIAGNOSIS — Z466 Encounter for fitting and adjustment of urinary device: Secondary | ICD-10-CM | POA: Diagnosis not present

## 2020-01-19 DIAGNOSIS — E785 Hyperlipidemia, unspecified: Secondary | ICD-10-CM | POA: Diagnosis not present

## 2020-01-19 DIAGNOSIS — I2510 Atherosclerotic heart disease of native coronary artery without angina pectoris: Secondary | ICD-10-CM | POA: Diagnosis not present

## 2020-01-19 DIAGNOSIS — K4090 Unilateral inguinal hernia, without obstruction or gangrene, not specified as recurrent: Secondary | ICD-10-CM | POA: Diagnosis not present

## 2020-01-19 DIAGNOSIS — I129 Hypertensive chronic kidney disease with stage 1 through stage 4 chronic kidney disease, or unspecified chronic kidney disease: Secondary | ICD-10-CM | POA: Diagnosis not present

## 2020-01-19 DIAGNOSIS — D631 Anemia in chronic kidney disease: Secondary | ICD-10-CM | POA: Diagnosis not present

## 2020-01-19 DIAGNOSIS — N184 Chronic kidney disease, stage 4 (severe): Secondary | ICD-10-CM | POA: Diagnosis not present

## 2020-01-19 DIAGNOSIS — K219 Gastro-esophageal reflux disease without esophagitis: Secondary | ICD-10-CM | POA: Diagnosis not present

## 2020-01-20 DIAGNOSIS — N184 Chronic kidney disease, stage 4 (severe): Secondary | ICD-10-CM | POA: Diagnosis not present

## 2020-01-20 DIAGNOSIS — K219 Gastro-esophageal reflux disease without esophagitis: Secondary | ICD-10-CM | POA: Diagnosis not present

## 2020-01-20 DIAGNOSIS — I129 Hypertensive chronic kidney disease with stage 1 through stage 4 chronic kidney disease, or unspecified chronic kidney disease: Secondary | ICD-10-CM | POA: Diagnosis not present

## 2020-01-20 DIAGNOSIS — K4090 Unilateral inguinal hernia, without obstruction or gangrene, not specified as recurrent: Secondary | ICD-10-CM | POA: Diagnosis not present

## 2020-01-20 DIAGNOSIS — Z466 Encounter for fitting and adjustment of urinary device: Secondary | ICD-10-CM | POA: Diagnosis not present

## 2020-01-20 DIAGNOSIS — E1122 Type 2 diabetes mellitus with diabetic chronic kidney disease: Secondary | ICD-10-CM | POA: Diagnosis not present

## 2020-01-20 DIAGNOSIS — E785 Hyperlipidemia, unspecified: Secondary | ICD-10-CM | POA: Diagnosis not present

## 2020-01-20 DIAGNOSIS — I2510 Atherosclerotic heart disease of native coronary artery without angina pectoris: Secondary | ICD-10-CM | POA: Diagnosis not present

## 2020-01-20 DIAGNOSIS — D631 Anemia in chronic kidney disease: Secondary | ICD-10-CM | POA: Diagnosis not present

## 2020-02-02 DIAGNOSIS — E785 Hyperlipidemia, unspecified: Secondary | ICD-10-CM | POA: Diagnosis not present

## 2020-02-02 DIAGNOSIS — K4090 Unilateral inguinal hernia, without obstruction or gangrene, not specified as recurrent: Secondary | ICD-10-CM | POA: Diagnosis not present

## 2020-02-02 DIAGNOSIS — N184 Chronic kidney disease, stage 4 (severe): Secondary | ICD-10-CM | POA: Diagnosis not present

## 2020-02-02 DIAGNOSIS — I2510 Atherosclerotic heart disease of native coronary artery without angina pectoris: Secondary | ICD-10-CM | POA: Diagnosis not present

## 2020-02-02 DIAGNOSIS — Z466 Encounter for fitting and adjustment of urinary device: Secondary | ICD-10-CM | POA: Diagnosis not present

## 2020-02-02 DIAGNOSIS — K219 Gastro-esophageal reflux disease without esophagitis: Secondary | ICD-10-CM | POA: Diagnosis not present

## 2020-02-02 DIAGNOSIS — E1122 Type 2 diabetes mellitus with diabetic chronic kidney disease: Secondary | ICD-10-CM | POA: Diagnosis not present

## 2020-02-02 DIAGNOSIS — D631 Anemia in chronic kidney disease: Secondary | ICD-10-CM | POA: Diagnosis not present

## 2020-02-02 DIAGNOSIS — I129 Hypertensive chronic kidney disease with stage 1 through stage 4 chronic kidney disease, or unspecified chronic kidney disease: Secondary | ICD-10-CM | POA: Diagnosis not present

## 2020-02-03 ENCOUNTER — Ambulatory Visit (INDEPENDENT_AMBULATORY_CARE_PROVIDER_SITE_OTHER): Payer: Medicare HMO | Admitting: Surgery

## 2020-02-03 ENCOUNTER — Encounter: Payer: Self-pay | Admitting: Surgery

## 2020-02-03 ENCOUNTER — Other Ambulatory Visit: Payer: Self-pay

## 2020-02-03 VITALS — BP 147/64 | HR 74 | Temp 97.7°F | Ht 69.0 in | Wt 143.4 lb

## 2020-02-03 DIAGNOSIS — I129 Hypertensive chronic kidney disease with stage 1 through stage 4 chronic kidney disease, or unspecified chronic kidney disease: Secondary | ICD-10-CM | POA: Diagnosis not present

## 2020-02-03 DIAGNOSIS — E1122 Type 2 diabetes mellitus with diabetic chronic kidney disease: Secondary | ICD-10-CM | POA: Diagnosis not present

## 2020-02-03 DIAGNOSIS — K4090 Unilateral inguinal hernia, without obstruction or gangrene, not specified as recurrent: Secondary | ICD-10-CM

## 2020-02-03 DIAGNOSIS — N184 Chronic kidney disease, stage 4 (severe): Secondary | ICD-10-CM | POA: Diagnosis not present

## 2020-02-03 DIAGNOSIS — I2510 Atherosclerotic heart disease of native coronary artery without angina pectoris: Secondary | ICD-10-CM | POA: Diagnosis not present

## 2020-02-03 DIAGNOSIS — E785 Hyperlipidemia, unspecified: Secondary | ICD-10-CM | POA: Diagnosis not present

## 2020-02-03 DIAGNOSIS — Z466 Encounter for fitting and adjustment of urinary device: Secondary | ICD-10-CM | POA: Diagnosis not present

## 2020-02-03 DIAGNOSIS — K219 Gastro-esophageal reflux disease without esophagitis: Secondary | ICD-10-CM | POA: Diagnosis not present

## 2020-02-03 DIAGNOSIS — D631 Anemia in chronic kidney disease: Secondary | ICD-10-CM | POA: Diagnosis not present

## 2020-02-03 NOTE — Patient Instructions (Addendum)
Dr.Pabon discussed with patient and patient's son the hernia repair surgery and the risk(s) of the surgery, and the recovery process given patient's age at today's visit. Patient will go home and discuss it with the family. Once they have made a decision to move forward with surgery, they will give our office a call.  Inguinal Hernia, Adult An inguinal hernia is when fat or your intestines push through a weak spot in a muscle where your leg meets your lower belly (groin). This causes a rounded lump (bulge). This kind of hernia could also be:  In your scrotum, if you are male.  In folds of skin around your vagina, if you are male. There are three types of inguinal hernias. These include:  Hernias that can be pushed back into the belly (are reducible). This type rarely causes pain.  Hernias that cannot be pushed back into the belly (are incarcerated).  Hernias that cannot be pushed back into the belly and lose their blood supply (are strangulated). This type needs emergency surgery. If you do not have symptoms, you may not need treatment. If you have symptoms or a large hernia, you may need surgery. Follow these instructions at home: Lifestyle  Do these things if told by your doctor so you do not have trouble pooping (constipation): ? Drink enough fluid to keep your pee (urine) pale yellow. ? Eat foods that have a lot of fiber. These include fresh fruits and vegetables, whole grains, and beans. ? Limit foods that are high in fat and processed sugars. These include foods that are fried or sweet. ? Take medicine for trouble pooping.  Avoid lifting heavy objects.  Avoid standing for long amounts of time.  Do not use any products that contain nicotine or tobacco. These include cigarettes and e-cigarettes. If you need help quitting, ask your doctor.  Stay at a healthy weight. General instructions  You may try to push your hernia in by very gently pressing on it when you are lying down.  Do not try to force the bulge back in if it will not push in easily.  Watch your hernia for any changes in shape, size, or color. Tell your doctor if you see any changes.  Take over-the-counter and prescription medicines only as told by your doctor.  Keep all follow-up visits as told by your doctor. This is important. Contact a doctor if:  You have a fever.  You have new symptoms.  Your symptoms get worse. Get help right away if:  The area where your leg meets your lower belly has: ? Pain that gets worse suddenly. ? A bulge that gets bigger suddenly, and it does not get smaller after that. ? A bulge that turns red or purple. ? A bulge that is painful when you touch it.  You are a man, and your scrotum: ? Suddenly feels painful. ? Suddenly changes in size.  You cannot push the hernia in by very gently pressing on it when you are lying down. Do not try to force the bulge back in if it will not push in easily.  You feel sick to your stomach (nauseous), and that feeling does not go away.  You throw up (vomit), and that keeps happening.  You have a fast heartbeat.  You cannot poop (have a bowel movement) or pass gas. These symptoms may be an emergency. Do not wait to see if the symptoms will go away. Get medical help right away. Call your local emergency services (911 in  the [place] Summary  An inguinal hernia is when fat or your intestines push through a weak spot in a muscle where your leg meets your lower belly (groin). This causes a rounded lump (bulge).  If you do not have symptoms, you may not need treatment. If you have symptoms or a large hernia, you may need surgery.  Avoid lifting heavy objects. Also avoid standing for long amounts of time.  Do not try to force the bulge back in if it will not push in easily. This information is not intended to replace advice given to you by your health care provider. Make sure you discuss any questions you have with your health care  provider. Document Revised: 12/28/2017 Document Reviewed: 08/28/2017 Elsevier Patient Education  [place].

## 2020-02-06 ENCOUNTER — Encounter: Payer: Self-pay | Admitting: Surgery

## 2020-02-06 NOTE — Progress Notes (Signed)
Outpatient Surgical Follow Up  02/06/2020  Jay Mathis is an 86 y.o. male.   Chief Complaint  Patient presents with  . Follow-up    follow up right inguinal hernia/ still having problems    HPI: Jay Mathis is a 86 year old well-known to our practice with a chronic right inguinal hernia.  He does significant comorbidities including coronary artery disease, prior CABG, aortic stenosis that is mild to moderate chronic kidney disease and anemia. He has recurrent bladder infections and has a chronic indwelling bladder catheter.  Recently admitted with urosepsis and now improved significantly.  I did see him more than 2 years ago and at that time he was thrombocytopenic and more anemic.  Apparently over the last couple years he has had some improvement on his overall status but continues to be anemic.  He simply was evaluated by cardiology who optimize him for potential intervention.  One of his main complaints was his urinary issues.  Was very clear with him and his son that the urinary issues were probably would not be resolved with hernia repair. He is able to walk with a walker around the house and he is able to make his own decisions.  His baseline creatinine is 2.38 his hemoglobin is around 8.4.  Past Medical History:  Diagnosis Date  . CAD (coronary artery disease)   . CKD (chronic kidney disease) stage 4, GFR 15-29 ml/min (HCC)   . Diabetes mellitus without complication ([place])   . GERD (gastroesophageal reflux disease)   . Hyperlipidemia   . Hypertension   . Iron deficiency anemia 07/30/2017  . Nephrolithiasis     Past Surgical History:  Procedure Laterality Date  . APPENDECTOMY     60 plus years ago  . CORONARY ANGIOPLASTY WITH STENT PLACEMENT    . CORONARY ARTERY BYPASS GRAFT      Family History  Problem Relation Age of Onset  . Prostate cancer Father   . Prostate cancer Brother     Social History:  reports that he has quit smoking. He has never used smokeless tobacco.  He reports that he does not drink alcohol or use drugs.  Allergies:  Allergies  Allergen Reactions  . Sulfa Antibiotics Rash    Medications reviewed.    ROS Full ROS performed and is otherwise negative other than what is stated in HPI   BP (!) 147/64   Pulse 74   Temp 97.7 F (36.5 C) (Temporal)   Ht 5\' 9"  (1.753 m)   Wt 143 lb 6.4 oz (65 kg)   SpO2 96%   BMI 21.18 kg/m   Physical Exam Vitals and nursing note reviewed. Exam conducted with a chaperone present.  Constitutional:      General: He is not in acute distress.    Appearance: He is normal weight.     Comments: Fragile, able to stand up on his own but takes his time  Eyes:     General: No scleral icterus.       Right eye: No discharge.        Left eye: No discharge.  Cardiovascular:     Rate and Rhythm: Normal rate and regular rhythm.     Heart sounds: Murmur present.  Pulmonary:     Effort: Pulmonary effort is normal. No respiratory distress.     Breath sounds: Normal breath sounds. No stridor. No wheezing.  Abdominal:     General: Abdomen is flat. There is no distension.     Palpations: There is  no mass.     Tenderness: There is no abdominal tenderness. There is no guarding or rebound.     Hernia: No hernia is present.  Musculoskeletal:     Cervical back: Normal range of motion and neck supple. No rigidity or tenderness.  Neurological:     General: No focal deficit present.     Mental Status: He is alert and oriented to person, place, and time.  Psychiatric:        Mood and Affect: Mood normal.        Behavior: Behavior normal.        Thought Content: Thought content normal.        Judgment: Judgment normal.    Assessment/Plan: 86 year old male with someone symptomatic right inguinal hernia in the setting of recurrent UTIs bladder issues and indwelling chronic urinary bladder catheter. Certainly not an ideal situation.  I have explained to him in detail that there is a good chance that even after  repairing his hernia his bladder issues will continue.  I have a very honest and transparent discussion with him and the son.  Expectations he certainly is a moderate risk for perioperative morbidity and mortality.  I encouraged him to talk about it and then decide after assuming all potential risk what is it that Jay Mathis wants to pursue it.  I encouraged him to think if this was very symptomatic with him and he could not live any longer to go ahead and pursue surgical intervention.  There is always a chance of doing expectant management if he so desires.  I will see him back in a few weeks after the will digest my discussion and at that time we will readdress potential for surgical intervention.  Please note that certainly does not require any emergent interventions at this time. He is interested in minimally invasive approach specifically robotic approach.  I do think that from a technical perspective he can be done robotically.  From a physiological perspective this is a more complicated issue and the decision will be ultimately after Jay Mathis.  Greater than 50% of the 40 minutes  visit was spent in counseling/coordination of care   Caroleen Hamman, MD [facility]

## 2020-02-10 DIAGNOSIS — N2581 Secondary hyperparathyroidism of renal origin: Secondary | ICD-10-CM | POA: Insufficient documentation

## 2020-02-10 DIAGNOSIS — E871 Hypo-osmolality and hyponatremia: Secondary | ICD-10-CM | POA: Insufficient documentation

## 2020-02-10 DIAGNOSIS — I129 Hypertensive chronic kidney disease with stage 1 through stage 4 chronic kidney disease, or unspecified chronic kidney disease: Secondary | ICD-10-CM | POA: Insufficient documentation

## 2020-02-10 DIAGNOSIS — E872 Acidosis, unspecified: Secondary | ICD-10-CM | POA: Insufficient documentation

## 2020-02-10 DIAGNOSIS — E875 Hyperkalemia: Secondary | ICD-10-CM | POA: Insufficient documentation

## 2020-02-10 DIAGNOSIS — R319 Hematuria, unspecified: Secondary | ICD-10-CM | POA: Insufficient documentation

## 2020-02-10 DIAGNOSIS — R809 Proteinuria, unspecified: Secondary | ICD-10-CM | POA: Insufficient documentation

## 2020-02-11 ENCOUNTER — Ambulatory Visit: Payer: Medicare HMO | Admitting: Urology

## 2020-02-11 ENCOUNTER — Other Ambulatory Visit: Payer: Self-pay

## 2020-02-11 ENCOUNTER — Encounter: Payer: Self-pay | Admitting: Urology

## 2020-02-11 VITALS — BP 144/65 | HR 69 | Ht 69.0 in | Wt 143.0 lb

## 2020-02-11 DIAGNOSIS — R339 Retention of urine, unspecified: Secondary | ICD-10-CM

## 2020-02-11 DIAGNOSIS — N401 Enlarged prostate with lower urinary tract symptoms: Secondary | ICD-10-CM

## 2020-02-11 DIAGNOSIS — N138 Other obstructive and reflux uropathy: Secondary | ICD-10-CM

## 2020-02-11 NOTE — Patient Instructions (Signed)

## 2020-02-11 NOTE — Progress Notes (Signed)
   02/11/2020 3:21 PM   Jay Mathis 06/15/1924 [id number]  Reason for visit: Urinary retention  HPI:  I saw Jay Mathis and his son back in urology clinic for discussion of bladder management options again.  He is a very comorbid 86-year-old male with CAD, history of CABG, stage IV CKD with baseline creatinine 2.8 (EGFR of 18 ) who was admitted in August 2020 with sepsis secondary to a UTI.  CT at that time showed bilateral hydronephrosis and a distended bladder with some bladder diverticulum.  On review of his prior history this was chronic from at least 2017.  His prostate is very enlarged at 96 g.  He has a long history of voiding symptoms, multiple recurrent UTIs, and multiple hospitalizations for sepsis from urinary source.  His bladder has been managed with a chronic indwelling Foley since August 2020 and he has overall done well since that time.  He is not had any UTIs or significant hematuria with the catheter in place.  We had previously discussed other possible options including CIC or HOLEP for his urinary retention, but with his frailty, he is okay continuing a chronic Foley with monthly changes.  I think this is certainly the best choice for him.  He would be high risk for incontinence after a HOLEP.  Continue bladder management with monthly Foley catheter changes RTC 2 weeks with PA for Foley change, then monthly Foley catheter changes RTC with me 6 months  I spent 20 total minutes on the day of the encounter including pre-visit review of the medical record, face-to-face time with the patient, and post visit ordering of labs/imaging/tests.  Brian C Sninsky, MD  [facility] [address], [address] [place], [place] [address] [phone]    

## 2020-02-17 ENCOUNTER — Ambulatory Visit: Payer: Medicare HMO | Admitting: Urology

## 2020-02-17 ENCOUNTER — Ambulatory Visit: Payer: Self-pay | Admitting: Surgery

## 2020-02-25 ENCOUNTER — Encounter: Payer: Self-pay | Admitting: Physician Assistant

## 2020-02-25 ENCOUNTER — Other Ambulatory Visit: Payer: Self-pay

## 2020-02-25 ENCOUNTER — Ambulatory Visit (INDEPENDENT_AMBULATORY_CARE_PROVIDER_SITE_OTHER): Payer: Medicare HMO | Admitting: Physician Assistant

## 2020-02-25 VITALS — BP 146/61 | HR 69 | Ht 69.0 in

## 2020-02-25 DIAGNOSIS — N472 Paraphimosis: Secondary | ICD-10-CM

## 2020-02-25 DIAGNOSIS — N3289 Other specified disorders of bladder: Secondary | ICD-10-CM | POA: Diagnosis not present

## 2020-02-25 DIAGNOSIS — R339 Retention of urine, unspecified: Secondary | ICD-10-CM | POA: Diagnosis not present

## 2020-02-25 NOTE — Patient Instructions (Addendum)
1. Stop Flomax. 2. Start daily Myrbetriq for management of bladder spasms. Call our office immediately if your foreskin becomes trapped behind the head of your penis again and you are not able to reduce it on your own. Paraphimosis Paraphimosis is a serious condition that happens when the fold of skin that stretches over the tip of the penis (foreskin) becomes stuck when it is pulled back. This condition blocks blood from flowing away from the penis tip, and that causes swelling that gets worse and worse. Paraphimosis needs to be treated right away. What are the causes? This condition may be caused by: Leaving the foreskin pulled back after a procedure, such as after the placement of a catheter. A foreskin that is tighter than normal. A foreskin that has been pulled back for too long. A forceful pulling back of the foreskin. Infection under the foreskin. Trauma to the area, such as a hard hit to the tip of the penis. Having sex or masturbating. Hair or clothing that gets wrapped around the penis. What increases the risk? You are more likely to develop this condition if you: Have not had all of your foreskin removed. Have had frequent urological procedures. Have poor hygiene. Need help with daily hygiene from a caregiver. What are the signs or symptoms? Symptoms of this condition include: A foreskin that cannot be returned to its normal position. Pain, often at the tip of the penis. Swelling of the penis. Trouble urinating. Skin that is red or bluish at the tip of the penis. How is this diagnosed? This condition may be diagnosed based on your symptoms and a physical exam. How is this treated? This condition may be treated with: A procedure in which your health care provider manually moves the foreskin back into position over the tip of the penis. Swelling may first be reduced by: Applying ice to the area. Wrapping a bandage tightly around the penis. Using needles to drain any pus or  blood that is causing the swelling. Applying a gauze soaked with certain medicines or solutions. Applying granulated sugar to the area. Injecting medicine into the foreskin to reduce swelling. Medicine to relieve pain. Medicine may be given by mouth, through an IV, or by an injection to the base of the penis (nerve block). A procedure in which a small incision is made in the tightened foreskin to free it and allow it to be pulled back into place (dorsal slit procedure). Surgery to remove the foreskin (circumcision). This may be done if the foreskin cannot be moved back into place. Most of the time, treatment can be done in a clinic or a health care provider's office. Follow these instructions at home: Lifestyle Follow instructions from your health care provider about avoiding sexual activity. Wear loose undergarments to avoid applying pressure to the area. General instructions Take over-the-counter and prescription medicines only as told by your health care provider. Apply any creams to the affected area only as told by your health care provider. Follow instructions from your health care provider about how to take care of your wound. Make sure you: Wash your hands with soap and water before and after you change your bandage (dressing) if you were given one. If soap and water are not available, use hand sanitizer. Ask when you should remove your dressing. Ask whether the area can get wet. Keep all follow-up visits as told by your health care provider. This is important. Contact a health care provider if: The treated area of skin does not  heal or it becomes more irritated, red, or bloody. Urination is difficult or painful. Pain in the penis continues, even after you take medicine for pain. You develop a fever. Summary Paraphimosis is a serious condition that happens when the fold of skin that stretches over the tip of the penis (foreskin) becomes stuck when it is pulled back. Paraphimosis  needs to be treated right away. Take over-the-counter and prescription medicines only as told by your health care provider. Apply any creams to the affected area only as told by your health care provider. Contact a health care provider if urination is difficult or painful, or if pain in the penis continues even after you take medicine for pain. Keep all follow-up visits as told by your health care provider. This is important. This information is not intended to replace advice given to you by your health care provider. Make sure you discuss any questions you have with your health care provider. Document Revised: 05/07/2018 Document Reviewed: 05/07/2018 Elsevier Patient Education  [place]. 3.

## 2020-02-25 NOTE — Progress Notes (Signed)
Cath Change/ Replacement  Patient is present today for a catheter change due to urinary retention.  54ml of water was removed from the balloon, a 89QJ silicone foley cath was removed without difficulty.  Patient was cleaned and prepped in a sterile fashion with betadine and 2% lidocaine jelly was instilled into the urethra. A 16 FR coud foley cath was replaced into the bladder no complications were noted Urine return was noted 58ml and urine was pale yellow in color. The balloon was filled with 5ml of sterile water. A leg bag was attached for drainage and attached to his anterior right thigh with a StatLock.  A night bag was also given to the patient and patient was given instruction on how to change from one bag to another. Patient was given proper instruction on catheter care.    Performed by: Debroah Loop, PA-C   Follow up: 1 month for catheter exchange.

## 2020-02-25 NOTE — Progress Notes (Signed)
02/25/2020 10:29 AM   Marsh Dolly Nobrega 1924-11-11 [id number]  CC: Leakage around Foley catheter, penile swelling  HPI: LESHAUN BIEBEL is a 86 y.o. comorbid male with chronic indwelling Foley catheter who presents today for monthly Foley catheter exchange and with additional concerns of urinary leakage around his Foley catheter as well as penile swelling.  Patient is accompanied today by his son, who contributes to HPI.  Patient reports he has been having some urinary leakage around his Foley catheter tubing.  He states the Foley catheter continues to drain well into the bag as well.  He denies lower abdominal pain associated with leakage episodes.  He wonders if upsizing his catheter would solve this problem.  Additionally, he notes swelling at the head of his penis for approximately 1 month.  He is not circumcised and states that his foreskin has been stuck behind the head of his penis for the duration of this time.  He states the condition was initially painful, however it has become less painful as some of the swelling has subsided.  Patient was obtaining monthly catheter exchanges at home with home health as recently as last month, however he would prefer to have this performed in clinic with [place] moving forward.  PMH: Past Medical History:  Diagnosis Date  . CAD (coronary artery disease)   . CKD (chronic kidney disease) stage 4, GFR 15-29 ml/min (HCC)   . Diabetes mellitus without complication ([place])   . GERD (gastroesophageal reflux disease)   . Hyperlipidemia   . Hypertension   . Iron deficiency anemia 07/30/2017  . Nephrolithiasis     Surgical History: Past Surgical History:  Procedure Laterality Date  . APPENDECTOMY     60 plus years ago  . CORONARY ANGIOPLASTY WITH STENT PLACEMENT    . CORONARY ARTERY BYPASS GRAFT      Home Medications:  Allergies as of 02/25/2020      Reactions   Sulfa Antibiotics Rash      Medication List       Accurate as of February 25, 2020 10:29 AM. If you have any questions, ask your nurse or doctor.        STOP taking these medications   tamsulosin 0.4 MG Caps capsule Commonly known as: FLOMAX Stopped by: Debroah Loop, PA-C     TAKE these medications   amLODipine 10 MG tablet Commonly known as: NORVASC Take 10 mg by mouth daily.   atorvastatin 10 MG tablet Commonly known as: LIPITOR Take 10 mg by mouth at bedtime.   carvedilol 3.125 MG tablet Commonly known as: COREG Take 3.125 mg 2 (two) times daily by mouth.   folic acid 1 MG tablet Commonly known as: FOLVITE Take 1 tablet (1 mg total) by mouth daily.   furosemide 40 MG tablet Commonly known as: LASIX Take 40 mg by mouth daily.   glimepiride 2 MG tablet Commonly known as: AMARYL Take 2 mg by mouth every morning.   pantoprazole 40 MG tablet Commonly known as: PROTONIX Take 40 mg by mouth 2 (two) times daily.   sodium bicarbonate 650 MG tablet Take 650 mg by mouth 2 (two) times daily.   vitamin B-12 500 MCG tablet Commonly known as: CYANOCOBALAMIN Take 1 tablet (500 mcg total) by mouth daily.       Allergies:  Allergies  Allergen Reactions  . Sulfa Antibiotics Rash    Family History: Family History  Problem Relation Age of Onset  . Prostate cancer Father   .  Prostate cancer Brother     Social History:   reports that he has quit smoking. He has never used smokeless tobacco. He reports that he does not drink alcohol or use drugs.  Physical Exam: BP (!) 146/61   Pulse 69   Ht 5\' 9"  (1.753 m)   BMI 21.12 kg/m   Constitutional:  Alert and oriented, no acute distress, nontoxic appearing HEENT: [place], AT Cardiovascular: No clubbing, cyanosis, or edema Respiratory: Normal respiratory effort, no increased work of breathing GU: Edematous foreskin trapped behind the glans penis.  Glans penis is pink with normal capillary refill.  Foley catheter in place draining clear, yellow urine. Skin: No rashes, bruises or suspicious  lesions Neurologic: Grossly intact, no focal deficits, moving all 4 extremities Psychiatric: Normal mood and affect  Assessment & Plan:   1. Urinary retention Patient presents to clinic today for monthly Foley catheter exchange.  Catheter exchange today, see separate procedure note for details.  Given that he plans to move forward with chronic Foley catheterization, I counseled him to discontinue Flomax at this time.  2. Bladder spasms Start trial of Myrbetriq 25 mg daily for management of bladder spasms; will reassess at next catheter exchange.  Samples provided.  3. Paraphimosis Patient noted to have a paraphimosis on physical exam today.  Ultimately, we were able to reduce the foreskin in clinic today.  Counseled the patient and his son extensively that this represents a urologic emergency and that if his foreskin retracts again and is unable to be reduced at home, he needs to contact our office immediately for further evaluation.  They expressed understanding.  Return in about 4 weeks (around 03/24/2020) for Catheter exchange.  Debroah Loop, PA-C  [facility] [address] [place], [place] [address] [phone]

## 2020-03-09 ENCOUNTER — Other Ambulatory Visit: Payer: Self-pay | Admitting: Oncology

## 2020-03-09 DIAGNOSIS — S92155B Nondisplaced avulsion fracture (chip fracture) of left talus, initial encounter for open fracture: Secondary | ICD-10-CM | POA: Diagnosis not present

## 2020-03-09 DIAGNOSIS — S8265XA Nondisplaced fracture of lateral malleolus of left fibula, initial encounter for closed fracture: Secondary | ICD-10-CM | POA: Diagnosis not present

## 2020-03-09 DIAGNOSIS — M25562 Pain in left knee: Secondary | ICD-10-CM | POA: Diagnosis not present

## 2020-03-09 DIAGNOSIS — M25572 Pain in left ankle and joints of left foot: Secondary | ICD-10-CM | POA: Diagnosis not present

## 2020-03-09 DIAGNOSIS — S92155A Nondisplaced avulsion fracture (chip fracture) of left talus, initial encounter for closed fracture: Secondary | ICD-10-CM | POA: Diagnosis not present

## 2020-03-09 DIAGNOSIS — S8002XA Contusion of left knee, initial encounter: Secondary | ICD-10-CM | POA: Diagnosis not present

## 2020-03-09 NOTE — Telephone Encounter (Signed)
He has not seen me for 1 year. Please see if he want to make follow u p appt. thanks

## 2020-03-11 NOTE — Telephone Encounter (Signed)
I have tried calling multiple times for 2 days but I get a message "call can not be completed at this time".  Will send denial to pharmacy requesting them to call the office.

## 2020-03-24 ENCOUNTER — Other Ambulatory Visit: Payer: Self-pay

## 2020-03-24 DIAGNOSIS — D509 Iron deficiency anemia, unspecified: Secondary | ICD-10-CM

## 2020-03-24 DIAGNOSIS — S92155B Nondisplaced avulsion fracture (chip fracture) of left talus, initial encounter for open fracture: Secondary | ICD-10-CM | POA: Diagnosis not present

## 2020-03-25 ENCOUNTER — Other Ambulatory Visit: Payer: Self-pay

## 2020-03-25 ENCOUNTER — Inpatient Hospital Stay: Payer: Medicare HMO

## 2020-03-25 ENCOUNTER — Inpatient Hospital Stay: Payer: Medicare HMO | Attending: Oncology | Admitting: Oncology

## 2020-03-25 ENCOUNTER — Encounter: Payer: Self-pay | Admitting: Oncology

## 2020-03-25 VITALS — BP 157/67 | HR 65 | Temp 97.0°F | Resp 18 | Wt 143.7 lb

## 2020-03-25 DIAGNOSIS — E785 Hyperlipidemia, unspecified: Secondary | ICD-10-CM | POA: Insufficient documentation

## 2020-03-25 DIAGNOSIS — M7989 Other specified soft tissue disorders: Secondary | ICD-10-CM | POA: Diagnosis not present

## 2020-03-25 DIAGNOSIS — Z79899 Other long term (current) drug therapy: Secondary | ICD-10-CM | POA: Insufficient documentation

## 2020-03-25 DIAGNOSIS — K4090 Unilateral inguinal hernia, without obstruction or gangrene, not specified as recurrent: Secondary | ICD-10-CM | POA: Insufficient documentation

## 2020-03-25 DIAGNOSIS — K219 Gastro-esophageal reflux disease without esophagitis: Secondary | ICD-10-CM | POA: Diagnosis not present

## 2020-03-25 DIAGNOSIS — Z87891 Personal history of nicotine dependence: Secondary | ICD-10-CM | POA: Diagnosis not present

## 2020-03-25 DIAGNOSIS — N184 Chronic kidney disease, stage 4 (severe): Secondary | ICD-10-CM | POA: Diagnosis not present

## 2020-03-25 DIAGNOSIS — D631 Anemia in chronic kidney disease: Secondary | ICD-10-CM | POA: Diagnosis not present

## 2020-03-25 DIAGNOSIS — D509 Iron deficiency anemia, unspecified: Secondary | ICD-10-CM

## 2020-03-25 DIAGNOSIS — I129 Hypertensive chronic kidney disease with stage 1 through stage 4 chronic kidney disease, or unspecified chronic kidney disease: Secondary | ICD-10-CM | POA: Insufficient documentation

## 2020-03-25 DIAGNOSIS — R531 Weakness: Secondary | ICD-10-CM | POA: Insufficient documentation

## 2020-03-25 DIAGNOSIS — I2510 Atherosclerotic heart disease of native coronary artery without angina pectoris: Secondary | ICD-10-CM | POA: Insufficient documentation

## 2020-03-25 LAB — CBC WITH DIFFERENTIAL/PLATELET
Abs Immature Granulocytes: 0.01 10*3/uL (ref 0.00–0.07)
Basophils Absolute: 0 10*3/uL (ref 0.0–0.1)
Basophils Relative: 1 %
Eosinophils Absolute: 0.5 10*3/uL (ref 0.0–0.5)
Eosinophils Relative: 7 %
HCT: 29.4 % — ABNORMAL LOW (ref 39.0–52.0)
Hemoglobin: 9.6 g/dL — ABNORMAL LOW (ref 13.0–17.0)
Immature Granulocytes: 0 %
Lymphocytes Relative: 27 %
Lymphs Abs: 1.8 10*3/uL (ref 0.7–4.0)
MCH: 27 pg (ref 26.0–34.0)
MCHC: 32.7 g/dL (ref 30.0–36.0)
MCV: 82.6 fL (ref 80.0–100.0)
Monocytes Absolute: 0.7 10*3/uL (ref 0.1–1.0)
Monocytes Relative: 11 %
Neutro Abs: 3.5 10*3/uL (ref 1.7–7.7)
Neutrophils Relative %: 54 %
Platelets: 230 10*3/uL (ref 150–400)
RBC: 3.56 MIL/uL — ABNORMAL LOW (ref 4.22–5.81)
RDW: 14.4 % (ref 11.5–15.5)
WBC: 6.6 10*3/uL (ref 4.0–10.5)
nRBC: 0 % (ref 0.0–0.2)

## 2020-03-25 LAB — COMPREHENSIVE METABOLIC PANEL
ALT: 12 U/L (ref 0–44)
AST: 15 U/L (ref 15–41)
Albumin: 3.7 g/dL (ref 3.5–5.0)
Alkaline Phosphatase: 87 U/L (ref 38–126)
Anion gap: 11 (ref 5–15)
BUN: 35 mg/dL — ABNORMAL HIGH (ref 8–23)
CO2: 28 mmol/L (ref 22–32)
Calcium: 9.1 mg/dL (ref 8.9–10.3)
Chloride: 93 mmol/L — ABNORMAL LOW (ref 98–111)
Creatinine, Ser: 2.47 mg/dL — ABNORMAL HIGH (ref 0.61–1.24)
GFR calc Af Amer: 25 mL/min — ABNORMAL LOW (ref >60)
GFR calc non Af Amer: 22 mL/min — ABNORMAL LOW (ref >60)
Glucose, Bld: 194 mg/dL — ABNORMAL HIGH (ref 70–99)
Potassium: 3.9 mmol/L (ref 3.5–5.1)
Sodium: 132 mmol/L — ABNORMAL LOW (ref 135–145)
Total Bilirubin: 0.6 mg/dL (ref 0.3–1.2)
Total Protein: 7.6 g/dL (ref 6.5–8.1)

## 2020-03-25 LAB — IRON AND TIBC
Iron: 73 ug/dL (ref 45–182)
Saturation Ratios: 24 % (ref 17.9–39.5)
TIBC: 300 ug/dL (ref 250–450)
UIBC: 227 ug/dL

## 2020-03-25 LAB — FERRITIN: Ferritin: 75 ng/mL (ref 24–336)

## 2020-03-25 NOTE — Progress Notes (Signed)
[facility] Follow Up visit  Patient Care Team: [name], MD as PCP - General (Internal Medicine)  REASON FOR VISIT Follow up for treatment of anemia due to CKD.   HISTORY OF PRESENTING ILLNESS: Jay Mathis 86 y.o. male with PMH listed as below who presents for follow up of his treatment of anemia.  Pertinent hematology history includes: Patient has recently established care with nephrologist Dr.Kulluru for CKD and was referred to [place] for anemia work up and treatment. Patient at that time reports feeling fatigue, weakness. Noted black stool. He has inguinal hernia for which he has seen surgery and was not recommended for intervention.  He was found to be functional iron deficiency and he has received 2 doses of Feraheme, tolerated well and four weekly dose of 1065mcg B12 shot.  Procrit was started on 08/15/2017, he received 40,000 units.  09/12/2017 Procrit 20,000 units # s/p 2 does of Feraheme and 4 weekly dose of Vitamin B12 shots. . Iron store is fully replete.  Interval History Patient presents for the management of anemia of chronic kidney disease. He was last seen by me on 12/31/2018.  He recently had a fall and acquired hairline fracture of left ankle. Chronic left lower extremity swelling.     Review of Systems  Constitutional: Negative for appetite change, chills, fatigue, fever and unexpected weight change.  HENT:   Negative for hearing loss and voice change.   Eyes: Negative for eye problems and icterus.  Respiratory: Negative for chest tightness, cough and shortness of breath.   Cardiovascular: Positive for leg swelling. Negative for chest pain.  Gastrointestinal: Negative for abdominal distention and abdominal pain.  Endocrine: Negative for hot flashes.  Genitourinary: Negative for difficulty urinating, dysuria and frequency.   Musculoskeletal: Negative for arthralgias.  Skin: Negative for itching and rash.  Neurological: Negative for  light-headedness and numbness.  Hematological: Negative for adenopathy. Does not bruise/bleed easily.  Psychiatric/Behavioral: Negative for confusion.    MEDICAL HISTORY: Past Medical History:  Diagnosis Date  . CAD (coronary artery disease)   . CKD (chronic kidney disease) stage 4, GFR 15-29 ml/min (HCC)   . Diabetes mellitus without complication ([place])   . GERD (gastroesophageal reflux disease)   . Hyperlipidemia   . Hypertension   . Iron deficiency anemia 07/30/2017  . Nephrolithiasis     SURGICAL HISTORY: Past Surgical History:  Procedure Laterality Date  . APPENDECTOMY     60 plus years ago  . CORONARY ANGIOPLASTY WITH STENT PLACEMENT    . CORONARY ARTERY BYPASS GRAFT      SOCIAL HISTORY: Social History   Socioeconomic History  . Marital status: Married    Spouse name: Not on file  . Number of children: Not on file  . Years of education: Not on file  . Highest education level: Not on file  Occupational History  . Not on file  Tobacco Use  . Smoking status: Former Research scientist (life sciences)  . Smokeless tobacco: Never Used  . Tobacco comment: quit about 60 years ago 2019 dated  Substance and Sexual Activity  . Alcohol use: No  . Drug use: No  . Sexual activity: Not Currently  Other Topics Concern  . Not on file  Social History Narrative  . Not on file   Social Determinants of Health   Financial Resource Strain:   . Difficulty of Paying Living Expenses:   Food Insecurity:   . Worried About Charity fundraiser in the Last Year:   .  Ran Out of Food in the Last Year:   Transportation Needs:   . Film/video editor (Medical):   Marland Kitchen Lack of Transportation (Non-Medical):   Physical Activity:   . Days of Exercise per Week:   . Minutes of Exercise per Session:   Stress:   . Feeling of Stress :   Social Connections:   . Frequency of Communication with Friends and Family:   . Frequency of Social Gatherings with Friends and Family:   . Attends Religious Services:   . Active  Member of Clubs or Organizations:   . Attends Archivist Meetings:   Marland Kitchen Marital Status:   Intimate Partner Violence:   . Fear of Current or Ex-Partner:   . Emotionally Abused:   Marland Kitchen Physically Abused:   . Sexually Abused:     FAMILY HISTORY Family History  Problem Relation Age of Onset  . Prostate cancer Father   . Prostate cancer Brother     ALLERGIES:  is allergic to sulfa antibiotics.  MEDICATIONS:  Current Outpatient Medications  Medication Sig Dispense Refill  . amLODipine (NORVASC) 10 MG tablet Take 10 mg by mouth daily.    Marland Kitchen atorvastatin (LIPITOR) 10 MG tablet Take 10 mg by mouth at bedtime.     . carvedilol (COREG) 3.125 MG tablet Take 3.125 mg 2 (two) times daily by mouth.    . folic acid (FOLVITE) 1 MG tablet Take 1 tablet (1 mg total) by mouth daily. 90 tablet 4  . furosemide (LASIX) 40 MG tablet Take 40 mg by mouth daily.     Marland Kitchen glimepiride (AMARYL) 2 MG tablet Take 2 mg by mouth every morning.     . pantoprazole (PROTONIX) 40 MG tablet Take 40 mg by mouth 2 (two) times daily.    . sodium bicarbonate 650 MG tablet Take 650 mg by mouth 2 (two) times daily.    . vitamin B-12 (CYANOCOBALAMIN) 500 MCG tablet Take 1 tablet (500 mcg total) by mouth daily. 90 tablet 3   No current facility-administered medications for this visit.    PHYSICAL EXAMINATION:  ECOG PERFORMANCE STATUS: 2 - Symptomatic, <50% confined to bed   Vitals:   03/25/20 1432  BP: (!) 157/67  Pulse: 65  Resp: 18  Temp: (!) 97 F (36.1 C)  SpO2: 100%    Filed Weights   03/25/20 1432  Weight: 143 lb 11.2 oz (65.2 kg)     Physical Exam  Constitutional: He is oriented to person, place, and time. No distress.  HENT:  Head: Normocephalic and atraumatic.  Nose: Nose normal.  Mouth/Throat: Oropharynx is clear and moist. No oropharyngeal exudate.  Eyes: Pupils are equal, round, and reactive to light. EOM are normal. Left eye exhibits no discharge. No scleral icterus.  Cardiovascular:  Normal rate and regular rhythm.  No murmur heard. Pulmonary/Chest: Effort normal. No respiratory distress. He has no rales. He exhibits no tenderness.  Abdominal: Soft. He exhibits no distension and no mass. There is no abdominal tenderness.  Musculoskeletal:        General: Edema present. Normal range of motion.     Cervical back: Normal range of motion and neck supple.  Neurological: He is alert and oriented to person, place, and time. No cranial nerve deficit. He exhibits normal muscle tone. Coordination normal.  Skin: Skin is warm and dry. He is not diaphoretic. No erythema.  Chronic lower extremity vascular insufficiency hyperpigmentation.  Psychiatric: Affect normal.      LABORATORY DATA: I have personally  reviewed the data as listed:  Appointment on 03/25/2020  Component Date Value Ref Range Status  . Iron 03/25/2020 73  45 - 182 ug/dL Final  . TIBC 03/25/2020 300  250 - 450 ug/dL Final  . Saturation Ratios 03/25/2020 24  17.9 - 39.5 % Final  . UIBC 03/25/2020 227  ug/dL Final   Performed at [facility], [address] [place], [place] [address]  . Ferritin 03/25/2020 75  24 - 336 ng/mL Final   Performed at [facility], [address] [place], [place] [address]  . Sodium 03/25/2020 132* 135 - 145 mmol/L Final  . Potassium 03/25/2020 3.9  3.5 - 5.1 mmol/L Final  . Chloride 03/25/2020 93* 98 - 111 mmol/L Final  . CO2 03/25/2020 28  22 - 32 mmol/L Final  . Glucose, Bld 03/25/2020 194* 70 - 99 mg/dL Final   Glucose reference range applies only to samples taken after fasting for at least 8 hours.  . BUN 03/25/2020 35* 8 - 23 mg/dL Final  . Creatinine, Ser 03/25/2020 2.47* 0.61 - 1.24 mg/dL Final  . Calcium 03/25/2020 9.1  8.9 - 10.3 mg/dL Final  . Total Protein 03/25/2020 7.6  6.5 - 8.1 g/dL Final  . Albumin 03/25/2020 3.7  3.5 - 5.0 g/dL Final  . AST 03/25/2020 15  15 - 41 U/L Final  . ALT 03/25/2020 12  0 - 44 U/L Final  . Alkaline Phosphatase  03/25/2020 87  38 - 126 U/L Final  . Total Bilirubin 03/25/2020 0.6  0.3 - 1.2 mg/dL Final  . GFR calc non Af Amer 03/25/2020 22* >60 mL/min Final  . GFR calc Af Amer 03/25/2020 25* >60 mL/min Final  . Anion gap 03/25/2020 11  5 - 15 Final   Performed at [facility], [address] [place], [place] [address]  . WBC 03/25/2020 6.6  4.0 - 10.5 K/uL Final  . RBC 03/25/2020 3.56* 4.22 - 5.81 MIL/uL Final  . Hemoglobin 03/25/2020 9.6* 13.0 - 17.0 g/dL Final  . HCT 03/25/2020 29.4* 39.0 - 52.0 % Final  . MCV 03/25/2020 82.6  80.0 - 100.0 fL Final  . MCH 03/25/2020 27.0  26.0 - 34.0 pg Final  . MCHC 03/25/2020 32.7  30.0 - 36.0 g/dL Final  . RDW 03/25/2020 14.4  11.5 - 15.5 % Final  . Platelets 03/25/2020 230  150 - 400 K/uL Final  . nRBC 03/25/2020 0.0  0.0 - 0.2 % Final  . Neutrophils Relative % 03/25/2020 54  % Final  . Neutro Abs 03/25/2020 3.5  1.7 - 7.7 K/uL Final  . Lymphocytes Relative 03/25/2020 27  % Final  . Lymphs Abs 03/25/2020 1.8  0.7 - 4.0 K/uL Final  . Monocytes Relative 03/25/2020 11  % Final  . Monocytes Absolute 03/25/2020 0.7  0.1 - 1.0 K/uL Final  . Eosinophils Relative 03/25/2020 7  % Final  . Eosinophils Absolute 03/25/2020 0.5  0.0 - 0.5 K/uL Final  . Basophils Relative 03/25/2020 1  % Final  . Basophils Absolute 03/25/2020 0.0  0.0 - 0.1 K/uL Final  . Immature Granulocytes 03/25/2020 0  % Final  . Abs Immature Granulocytes 03/25/2020 0.01  0.00 - 0.07 K/uL Final   Performed at [facility], [address] [place], [place] [address]   [facility] results.  07/08/2017 Iron 81, TIBC 298, Saturation 27%, Ferritin 57 Iron/TIBC/Ferritin/ %Sat    Component Value Date/Time   IRON 73 03/25/2020 1358   TIBC 300 03/25/2020 1358  FERRITIN 75 03/25/2020 1358   IRONPCTSAT 24 03/25/2020 1358   Tested negative for anti parietal antibody or intrinsic factor antibody. He is tested negative for occult blood in stool.   RADIOGRAPHIC STUDIES: I have personally  reviewed the radiological images as listed and agree with the findings in the report 06/10/2017 CT abdomen pelvis IMPRESSION: Wide neck small bowel containing indirect right inguinal hernia. Mild stranding of the herniated mesenteric without definite evidence of incarceration. Atrophic appearance of the kidneys. Multiple urinary bladder diverticulae.  ASSESSMENT/PLAN  1. Iron deficiency anemia, unspecified iron deficiency anemia type   2. Anemia in stage 4 chronic kidney disease ([place])    Labs are reviewed and discussed with patient. Hemoglobin has been stable, at 9.6,  Iron panel showed ferritin level of 75, iron saturation 24.  Hold erythropoietin replacement therapy given recent hairline fracture and immbilization.  Repeat blood work in 6 weeks and re-evaluation.     Orders Placed This Encounter  Procedures  . Vitamin B12    Standing Status:   Future    Standing Expiration Date:   05/09/2020  . CBC with Differential/Platelet    Standing Status:   Future    Standing Expiration Date:   05/09/2020  . Ferritin    Standing Status:   Future    Standing Expiration Date:   05/09/2020  . Iron and TIBC    Standing Status:   Future    Standing Expiration Date:   05/09/2020   Earlie Server, MD, PhD Hematology Oncology [facility] Pager- [phone] 03/25/20

## 2020-03-25 NOTE — Progress Notes (Signed)
Pt in with son for follow up, states been since "before covid" that he's been in.  Pt fell 3 weeks ago and has hair line fracture to left ankle.  Pt seeing ortho and states "healing well per MD".  Son states pt has become more sluggish over time.

## 2020-03-28 ENCOUNTER — Other Ambulatory Visit: Payer: Self-pay | Admitting: Physician Assistant

## 2020-03-28 ENCOUNTER — Ambulatory Visit (INDEPENDENT_AMBULATORY_CARE_PROVIDER_SITE_OTHER): Payer: Medicare HMO | Admitting: Physician Assistant

## 2020-03-28 ENCOUNTER — Other Ambulatory Visit: Payer: Self-pay

## 2020-03-28 VITALS — BP 155/57 | HR 69 | Ht 71.0 in | Wt 144.0 lb

## 2020-03-28 DIAGNOSIS — R339 Retention of urine, unspecified: Secondary | ICD-10-CM | POA: Diagnosis not present

## 2020-03-28 MED ORDER — MIRABEGRON ER 25 MG PO TB24: 25.0000 mg | ORAL_TABLET | Freq: Every day | ORAL | 11 refills | Status: DC

## 2020-03-28 NOTE — Progress Notes (Signed)
Cath Change/ Replacement  Patient is present today for a catheter change due to urinary retention.  33ml of water was removed from the balloon, a 16FR coud foley cath was removed without difficulty.  Patient was cleaned and prepped in a sterile fashion with betadine and 2% lidocaine was instilled into the urethra. A 16 FR coud foley cath was replaced into the bladder no complications were noted Urine return was noted 37ml and urine was clear in color. The balloon was filled with 55ml of sterile water. A leg bag was attached for drainage and secured to his anterior left leg with a StatLock.  A night bag was also given to the patient and patient was given instruction on how to change from one bag to another. Patient was given proper instruction on catheter care.    Performed by: Debroah Loop, PA-C   Follow up: Return in about 4 weeks (around 04/25/2020) for Catheter exchange.

## 2020-03-28 NOTE — Progress Notes (Signed)
Patient reports improvement in urinary leakage around his Foley catheter on Myrbetriq 25 mg daily.  Represcribed today.

## 2020-03-30 ENCOUNTER — Other Ambulatory Visit: Payer: Self-pay | Admitting: Oncology

## 2020-04-21 ENCOUNTER — Encounter: Payer: Self-pay | Admitting: *Deleted

## 2020-04-25 ENCOUNTER — Other Ambulatory Visit: Payer: Self-pay

## 2020-04-25 ENCOUNTER — Ambulatory Visit (INDEPENDENT_AMBULATORY_CARE_PROVIDER_SITE_OTHER): Payer: Medicare HMO | Admitting: Physician Assistant

## 2020-04-25 DIAGNOSIS — R339 Retention of urine, unspecified: Secondary | ICD-10-CM | POA: Diagnosis not present

## 2020-04-25 NOTE — Progress Notes (Signed)
Cath Change/ Replacement  Patient is present today for a catheter change due to urinary retention.  29ml of water was removed from the balloon, a 16FR coude foley cath was removed without difficulty.  Patient was cleaned and prepped in a sterile fashion with betadine and 2% lidocaine jelly was instilled into the urethra.  The foreskin was not reduced secondary to edema. A 16 FR coude foley cath was replaced into the bladder no complications were noted Urine return was noted 46ml and urine was clear in color. The balloon was filled with 65ml of sterile water. A leg bag was attached for drainage and secured to his anterior right thigh with a StatLock.  A night bag was also given to the patient and patient was given instruction on how to change from one bag to another. Patient was given proper instruction on catheter care.    Performed by: Debroah Loop, PA-C   Follow up: Return in about 4 weeks (around 05/23/2020) for Catheter exchange.

## 2020-05-04 ENCOUNTER — Inpatient Hospital Stay: Payer: Medicare HMO | Attending: Oncology

## 2020-05-04 ENCOUNTER — Other Ambulatory Visit: Payer: Self-pay

## 2020-05-04 DIAGNOSIS — D631 Anemia in chronic kidney disease: Secondary | ICD-10-CM | POA: Insufficient documentation

## 2020-05-04 DIAGNOSIS — I129 Hypertensive chronic kidney disease with stage 1 through stage 4 chronic kidney disease, or unspecified chronic kidney disease: Secondary | ICD-10-CM | POA: Insufficient documentation

## 2020-05-04 DIAGNOSIS — E785 Hyperlipidemia, unspecified: Secondary | ICD-10-CM | POA: Insufficient documentation

## 2020-05-04 DIAGNOSIS — K219 Gastro-esophageal reflux disease without esophagitis: Secondary | ICD-10-CM | POA: Diagnosis not present

## 2020-05-04 DIAGNOSIS — M7989 Other specified soft tissue disorders: Secondary | ICD-10-CM | POA: Insufficient documentation

## 2020-05-04 DIAGNOSIS — R5383 Other fatigue: Secondary | ICD-10-CM | POA: Insufficient documentation

## 2020-05-04 DIAGNOSIS — Z79899 Other long term (current) drug therapy: Secondary | ICD-10-CM | POA: Insufficient documentation

## 2020-05-04 DIAGNOSIS — I2510 Atherosclerotic heart disease of native coronary artery without angina pectoris: Secondary | ICD-10-CM | POA: Insufficient documentation

## 2020-05-04 DIAGNOSIS — Z7984 Long term (current) use of oral hypoglycemic drugs: Secondary | ICD-10-CM | POA: Diagnosis not present

## 2020-05-04 DIAGNOSIS — D509 Iron deficiency anemia, unspecified: Secondary | ICD-10-CM | POA: Diagnosis not present

## 2020-05-04 DIAGNOSIS — N184 Chronic kidney disease, stage 4 (severe): Secondary | ICD-10-CM | POA: Diagnosis not present

## 2020-05-04 DIAGNOSIS — Z87891 Personal history of nicotine dependence: Secondary | ICD-10-CM | POA: Diagnosis not present

## 2020-05-04 DIAGNOSIS — E119 Type 2 diabetes mellitus without complications: Secondary | ICD-10-CM | POA: Insufficient documentation

## 2020-05-04 DIAGNOSIS — K4090 Unilateral inguinal hernia, without obstruction or gangrene, not specified as recurrent: Secondary | ICD-10-CM | POA: Insufficient documentation

## 2020-05-04 LAB — CBC WITH DIFFERENTIAL/PLATELET
Abs Immature Granulocytes: 0.01 10*3/uL (ref 0.00–0.07)
Basophils Absolute: 0 10*3/uL (ref 0.0–0.1)
Basophils Relative: 1 %
Eosinophils Absolute: 0.4 10*3/uL (ref 0.0–0.5)
Eosinophils Relative: 6 %
HCT: 30.5 % — ABNORMAL LOW (ref 39.0–52.0)
Hemoglobin: 10.2 g/dL — ABNORMAL LOW (ref 13.0–17.0)
Immature Granulocytes: 0 %
Lymphocytes Relative: 28 %
Lymphs Abs: 1.7 10*3/uL (ref 0.7–4.0)
MCH: 27.6 pg (ref 26.0–34.0)
MCHC: 33.4 g/dL (ref 30.0–36.0)
MCV: 82.4 fL (ref 80.0–100.0)
Monocytes Absolute: 0.7 10*3/uL (ref 0.1–1.0)
Monocytes Relative: 11 %
Neutro Abs: 3.3 10*3/uL (ref 1.7–7.7)
Neutrophils Relative %: 54 %
Platelets: 188 10*3/uL (ref 150–400)
RBC: 3.7 MIL/uL — ABNORMAL LOW (ref 4.22–5.81)
RDW: 13.2 % (ref 11.5–15.5)
WBC: 6.1 10*3/uL (ref 4.0–10.5)
nRBC: 0 % (ref 0.0–0.2)

## 2020-05-04 LAB — VITAMIN B12: Vitamin B-12: 963 pg/mL — ABNORMAL HIGH (ref 180–914)

## 2020-05-05 LAB — IRON AND TIBC
Iron: 61 ug/dL (ref 45–182)
Saturation Ratios: 19 % (ref 17.9–39.5)
TIBC: 318 ug/dL (ref 250–450)
UIBC: 257 ug/dL

## 2020-05-05 LAB — FERRITIN: Ferritin: 78 ng/mL (ref 24–336)

## 2020-05-06 ENCOUNTER — Inpatient Hospital Stay (HOSPITAL_BASED_OUTPATIENT_CLINIC_OR_DEPARTMENT_OTHER): Payer: Medicare HMO | Admitting: Oncology

## 2020-05-06 ENCOUNTER — Inpatient Hospital Stay: Payer: Medicare HMO

## 2020-05-06 ENCOUNTER — Other Ambulatory Visit: Payer: Self-pay

## 2020-05-06 ENCOUNTER — Encounter: Payer: Self-pay | Admitting: Oncology

## 2020-05-06 VITALS — BP 152/53 | HR 60 | Temp 97.7°F | Resp 18 | Wt 143.2 lb

## 2020-05-06 DIAGNOSIS — M7989 Other specified soft tissue disorders: Secondary | ICD-10-CM | POA: Diagnosis not present

## 2020-05-06 DIAGNOSIS — N184 Chronic kidney disease, stage 4 (severe): Secondary | ICD-10-CM | POA: Diagnosis not present

## 2020-05-06 DIAGNOSIS — D631 Anemia in chronic kidney disease: Secondary | ICD-10-CM

## 2020-05-06 DIAGNOSIS — D509 Iron deficiency anemia, unspecified: Secondary | ICD-10-CM | POA: Diagnosis not present

## 2020-05-06 DIAGNOSIS — R5383 Other fatigue: Secondary | ICD-10-CM | POA: Diagnosis not present

## 2020-05-06 DIAGNOSIS — E119 Type 2 diabetes mellitus without complications: Secondary | ICD-10-CM | POA: Diagnosis not present

## 2020-05-06 DIAGNOSIS — I2510 Atherosclerotic heart disease of native coronary artery without angina pectoris: Secondary | ICD-10-CM | POA: Diagnosis not present

## 2020-05-06 DIAGNOSIS — K4090 Unilateral inguinal hernia, without obstruction or gangrene, not specified as recurrent: Secondary | ICD-10-CM | POA: Diagnosis not present

## 2020-05-06 DIAGNOSIS — I129 Hypertensive chronic kidney disease with stage 1 through stage 4 chronic kidney disease, or unspecified chronic kidney disease: Secondary | ICD-10-CM | POA: Diagnosis not present

## 2020-05-06 NOTE — Progress Notes (Signed)
Pt here for follow up. No new concerns voiced.   

## 2020-05-06 NOTE — Progress Notes (Signed)
[facility] Follow Up visit  Patient Care Team: [name], MD as PCP - General (Internal Medicine)  REASON FOR VISIT Follow up for treatment of anemia due to CKD.   HISTORY OF PRESENTING ILLNESS: Jay Mathis 86 y.o. male with PMH listed as below who presents for follow up of his treatment of anemia.  Pertinent hematology history includes: Patient has recently established care with nephrologist Dr.Kulluru for CKD and was referred to [place] for anemia work up and treatment. Patient at that time reports feeling fatigue, weakness. Noted black stool. He has inguinal hernia for which he has seen surgery and was not recommended for intervention.  He was found to be functional iron deficiency and he has received 2 doses of Feraheme, tolerated well and four weekly dose of 1039mcg B12 shot.  Procrit was started on 08/15/2017, he received 40,000 units.  09/12/2017 Procrit 20,000 units # s/p 2 does of Feraheme and 4 weekly dose of Vitamin B12 shots. . Iron store is fully replete.  # 03/25/2020 he recently had a fall and acquired hairline fracture of left ankle. Interval History Patient presents for the management of anemia of chronic kidney disease. Patient reports no new complaints.  He has been eating well recently.  Denies any fatigue.  Chronic left lower extremity swelling.     Review of Systems  Constitutional: Positive for fatigue. Negative for appetite change, chills, fever and unexpected weight change.  HENT:   Negative for hearing loss and voice change.   Eyes: Negative for eye problems and icterus.  Respiratory: Negative for chest tightness, cough and shortness of breath.   Cardiovascular: Positive for leg swelling. Negative for chest pain.  Gastrointestinal: Negative for abdominal distention and abdominal pain.  Endocrine: Negative for hot flashes.  Genitourinary: Negative for difficulty urinating, dysuria and frequency.   Musculoskeletal: Negative for  arthralgias.  Skin: Negative for itching and rash.  Neurological: Negative for light-headedness and numbness.  Hematological: Negative for adenopathy. Does not bruise/bleed easily.  Psychiatric/Behavioral: Negative for confusion.    MEDICAL HISTORY: Past Medical History:  Diagnosis Date  . CAD (coronary artery disease)   . CKD (chronic kidney disease) stage 4, GFR 15-29 ml/min (HCC)   . Diabetes mellitus without complication ([place])   . GERD (gastroesophageal reflux disease)   . Hyperlipidemia   . Hypertension   . Iron deficiency anemia 07/30/2017  . Nephrolithiasis     SURGICAL HISTORY: Past Surgical History:  Procedure Laterality Date  . APPENDECTOMY     60 plus years ago  . CORONARY ANGIOPLASTY WITH STENT PLACEMENT    . CORONARY ARTERY BYPASS GRAFT      SOCIAL HISTORY: Social History   Socioeconomic History  . Marital status: Married    Spouse name: Not on file  . Number of children: Not on file  . Years of education: Not on file  . Highest education level: Not on file  Occupational History  . Not on file  Tobacco Use  . Smoking status: Former Research scientist (life sciences)  . Smokeless tobacco: Never Used  . Tobacco comment: quit about 60 years ago 2019 dated  Substance and Sexual Activity  . Alcohol use: No  . Drug use: No  . Sexual activity: Not Currently  Other Topics Concern  . Not on file  Social History Narrative  . Not on file   Social Determinants of Health   Financial Resource Strain:   . Difficulty of Paying Living Expenses:   Food Insecurity:   .  Worried About Charity fundraiser in the Last Year:   . Arboriculturist in the Last Year:   Transportation Needs:   . Film/video editor (Medical):   Marland Kitchen Lack of Transportation (Non-Medical):   Physical Activity:   . Days of Exercise per Week:   . Minutes of Exercise per Session:   Stress:   . Feeling of Stress :   Social Connections:   . Frequency of Communication with Friends and Family:   . Frequency of Social  Gatherings with Friends and Family:   . Attends Religious Services:   . Active Member of Clubs or Organizations:   . Attends Archivist Meetings:   Marland Kitchen Marital Status:   Intimate Partner Violence:   . Fear of Current or Ex-Partner:   . Emotionally Abused:   Marland Kitchen Physically Abused:   . Sexually Abused:     FAMILY HISTORY Family History  Problem Relation Age of Onset  . Prostate cancer Father   . Prostate cancer Brother     ALLERGIES:  is allergic to sulfa antibiotics.  MEDICATIONS:  Current Outpatient Medications  Medication Sig Dispense Refill  . amLODipine (NORVASC) 10 MG tablet Take 10 mg by mouth daily.    Marland Kitchen atorvastatin (LIPITOR) 10 MG tablet Take 10 mg by mouth at bedtime.     . carvedilol (COREG) 3.125 MG tablet Take 3.125 mg 2 (two) times daily by mouth.    . ergocalciferol (VITAMIN D2) 1.25 MG (50000 UT) capsule ergocalciferol (vitamin D2) 1,250 mcg (50,000 unit) capsule  TAKE 1 CAPSULE BY MOUTH ONE TIME PER WEEK    . folic acid (FOLVITE) 1 MG tablet TAKE 1 TABLET BY MOUTH EVERY DAY 90 tablet 4  . furosemide (LASIX) 40 MG tablet Take 40 mg by mouth daily.     Marland Kitchen glimepiride (AMARYL) 2 MG tablet Take 2 mg by mouth every morning.     . mirabegron ER (MYRBETRIQ) 25 MG TB24 tablet Take 1 tablet (25 mg total) by mouth daily. 30 tablet 11  . pantoprazole (PROTONIX) 40 MG tablet Take 40 mg by mouth 2 (two) times daily.    . sodium bicarbonate 650 MG tablet Take 650 mg by mouth 2 (two) times daily.    . vitamin B-12 (CYANOCOBALAMIN) 500 MCG tablet Take 1 tablet (500 mcg total) by mouth daily. 90 tablet 3   No current facility-administered medications for this visit.    PHYSICAL EXAMINATION:  ECOG PERFORMANCE STATUS: 2 - Symptomatic, <50% confined to bed   Vitals:   05/06/20 1341  BP: (!) 152/53  Pulse: 60  Resp: 18  Temp: 97.7 F (36.5 C)    Filed Weights   05/06/20 1341  Weight: 143 lb 3.2 oz (65 kg)     Physical Exam  Constitutional: He is oriented  to person, place, and time. No distress.  HENT:  Head: Normocephalic and atraumatic.  Nose: Nose normal.  Mouth/Throat: Oropharynx is clear and moist. No oropharyngeal exudate.  Eyes: Pupils are equal, round, and reactive to light. EOM are normal. Left eye exhibits no discharge. No scleral icterus.  Cardiovascular: Normal rate and regular rhythm.  Murmur heard. Pulmonary/Chest: Effort normal. No respiratory distress. He has no rales. He exhibits no tenderness.  Abdominal: Soft. He exhibits no distension and no mass. There is no abdominal tenderness.  Musculoskeletal:        General: Edema present. Normal range of motion.     Cervical back: Normal range of motion and neck supple.  Neurological: He is alert and oriented to person, place, and time. No cranial nerve deficit. He exhibits normal muscle tone. Coordination normal.  Skin: Skin is warm and dry. He is not diaphoretic. No erythema.  Chronic lower extremity vascular insufficiency hyperpigmentation.  Psychiatric: Affect normal.      LABORATORY DATA: I have personally reviewed the data as listed:  Appointment on 05/04/2020  Component Date Value Ref Range Status  . Iron 05/04/2020 61  45 - 182 ug/dL Final  . TIBC 05/04/2020 318  250 - 450 ug/dL Final  . Saturation Ratios 05/04/2020 19  17.9 - 39.5 % Final  . UIBC 05/04/2020 257  ug/dL Final   Performed at [facility], [address] [place], [place] [address]  . Ferritin 05/04/2020 78  24 - 336 ng/mL Final   Performed at [facility], [address] [place], [place] [address]  . WBC 05/04/2020 6.1  4.0 - 10.5 K/uL Final  . RBC 05/04/2020 3.70* 4.22 - 5.81 MIL/uL Final  . Hemoglobin 05/04/2020 10.2* 13.0 - 17.0 g/dL Final  . HCT 05/04/2020 30.5* 39.0 - 52.0 % Final  . MCV 05/04/2020 82.4  80.0 - 100.0 fL Final  . MCH 05/04/2020 27.6  26.0 - 34.0 pg Final  . MCHC 05/04/2020 33.4  30.0 - 36.0 g/dL Final  . RDW 05/04/2020 13.2  11.5 - 15.5 % Final  .  Platelets 05/04/2020 188  150 - 400 K/uL Final  . nRBC 05/04/2020 0.0  0.0 - 0.2 % Final  . Neutrophils Relative % 05/04/2020 54  % Final  . Neutro Abs 05/04/2020 3.3  1.7 - 7.7 K/uL Final  . Lymphocytes Relative 05/04/2020 28  % Final  . Lymphs Abs 05/04/2020 1.7  0.7 - 4.0 K/uL Final  . Monocytes Relative 05/04/2020 11  % Final  . Monocytes Absolute 05/04/2020 0.7  0.1 - 1.0 K/uL Final  . Eosinophils Relative 05/04/2020 6  % Final  . Eosinophils Absolute 05/04/2020 0.4  0.0 - 0.5 K/uL Final  . Basophils Relative 05/04/2020 1  % Final  . Basophils Absolute 05/04/2020 0.0  0.0 - 0.1 K/uL Final  . Immature Granulocytes 05/04/2020 0  % Final  . Abs Immature Granulocytes 05/04/2020 0.01  0.00 - 0.07 K/uL Final   Performed at [facility], [address], [place], [place] [address]  . Vitamin B-12 05/04/2020 963* 180 - 914 pg/mL Final   Comment: (NOTE) This assay is not validated for testing neonatal or myeloproliferative syndrome specimens for Vitamin B12 levels. Performed at [facility], [place] [address] [place], [place] [address]    [facility] results.  07/08/2017 Iron 81, TIBC 298, Saturation 27%, Ferritin 57 Iron/TIBC/Ferritin/ %Sat    Component Value Date/Time   IRON 61 05/04/2020 1327   TIBC 318 05/04/2020 1327   FERRITIN 78 05/04/2020 1327   IRONPCTSAT 19 05/04/2020 1327   Tested negative for anti parietal antibody or intrinsic factor antibody. He is tested negative for occult blood in stool.   RADIOGRAPHIC STUDIES: I have personally reviewed the radiological images as listed and agree with the findings in the report No results found.   ASSESSMENT/PLAN  1. Iron deficiency anemia, unspecified iron deficiency anemia type   2. Anemia in stage 4 chronic kidney disease ([place])    #Labs reviewed and discussed with patient.  His hemoglobin has improved to 10.2, this is an acceptable level in the context of chronic kidney disease.  I will hold off Retacrit  today. Iron  panel showed stable ferritin level at 78, iron saturation 19.  Recommend patient to take multivitamin. Vitamin B12 level is adequate at 963.  I advised patient to follow-up taking vitamin 12- 500 MCG daily.  I will repeat lab at the next visit. Patient can follow-up with me in 3 months.  I asked patient/family member to call me if he feels weaker and we can do blood work earlier.  Orders Placed This Encounter  Procedures  . CBC with Differential/Platelet    Standing Status:   Future    Standing Expiration Date:   05/06/2021  . Ferritin    Standing Status:   Future    Standing Expiration Date:   05/06/2021  . Iron and TIBC    Standing Status:   Future    Standing Expiration Date:   05/06/2021  . Vitamin B12    Standing Status:   Future    Standing Expiration Date:   05/06/2021   Earlie Server, MD, PhD Hematology Oncology [facility] [place] at [facility] Pager- [phone] 05/06/20

## 2020-05-16 ENCOUNTER — Other Ambulatory Visit: Payer: Self-pay | Admitting: Internal Medicine

## 2020-05-16 DIAGNOSIS — Z1331 Encounter for screening for depression: Secondary | ICD-10-CM

## 2020-05-30 ENCOUNTER — Other Ambulatory Visit: Payer: Self-pay

## 2020-05-30 ENCOUNTER — Ambulatory Visit: Payer: Medicare HMO | Admitting: Physician Assistant

## 2020-05-30 DIAGNOSIS — R339 Retention of urine, unspecified: Secondary | ICD-10-CM | POA: Diagnosis not present

## 2020-05-30 NOTE — Progress Notes (Signed)
Cath Change/ Replacement  Patient is present today for a catheter change due to urinary retention.  20ml of water was removed from the balloon and a 16FR coude foley cath was removed without difficulty.  Patient was cleaned and prepped in a sterile fashion with betadine and 2% lidocaine jelly was instilled into the urethra. Foreskin unable to be retracted due to residual edema from recent paraphimosis. A 16 FR coude foley cath was replaced into the bladder no complications were noted Urine return was noted 17ml and urine was yellow in color. The balloon was filled with 63ml of sterile water. A leg bag was attached for drainage and secured to his left thigh with a StatLock. An additional leg bag and leg strap were also given to the patient and patient was given instruction on how to change from one bag to another. Patient was given proper instruction on catheter care.    Performed by: Debroah Loop, PA-C   Follow up: Return in about 4 weeks (around 06/27/2020) for Catheter exchange.

## 2020-06-04 ENCOUNTER — Other Ambulatory Visit: Payer: Self-pay | Admitting: Internal Medicine

## 2020-06-04 DIAGNOSIS — Z1331 Encounter for screening for depression: Secondary | ICD-10-CM

## 2020-06-27 ENCOUNTER — Encounter: Payer: Self-pay | Admitting: Physician Assistant

## 2020-06-27 ENCOUNTER — Other Ambulatory Visit: Payer: Self-pay

## 2020-06-27 ENCOUNTER — Ambulatory Visit (INDEPENDENT_AMBULATORY_CARE_PROVIDER_SITE_OTHER): Payer: Medicare HMO | Admitting: Physician Assistant

## 2020-06-27 VITALS — BP 153/64 | HR 60 | Ht 71.0 in | Wt 142.0 lb

## 2020-06-27 DIAGNOSIS — R339 Retention of urine, unspecified: Secondary | ICD-10-CM

## 2020-06-27 NOTE — Progress Notes (Signed)
Cath Change/ Replacement  Patient is present today for a catheter change due to urinary retention.  61ml of water was removed from the balloon, a 16FR coude foley cath was removed without difficulty.  Patient was cleaned and prepped in a sterile fashion with betadine and 2% lidocaine jelly was instilled into the urethra. A 16 FR foley cath was replaced into the bladder no complications were noted Urine return was noted 69ml and urine was yellow in color. The balloon was filled with 94ml of sterile water. A leg bag was attached for drainage.  Patient declined additional drainage bags.  Foreskin was noted to be reduced at the end of the procedure.    Performed by: Debroah Loop, PA-C   Follow up: Return in about 4 weeks (around 07/25/2020) for Catheter exchange.

## 2020-07-28 ENCOUNTER — Ambulatory Visit: Payer: Medicare HMO | Admitting: Physician Assistant

## 2020-07-28 ENCOUNTER — Other Ambulatory Visit: Payer: Self-pay

## 2020-07-28 DIAGNOSIS — R339 Retention of urine, unspecified: Secondary | ICD-10-CM | POA: Diagnosis not present

## 2020-07-28 NOTE — Progress Notes (Signed)
Cath Change/ Replacement  Patient is present today for a catheter change due to urinary retention.  70ml of water was removed from the balloon, a 16FR foley cath was removed without difficulty.  Patient was cleaned and prepped in a sterile fashion with betadine and 2% lidocaine jelly was instilled into the urethra. A 16 FR coud foley cath was replaced into the bladder no complications were noted Urine return was noted 30ml and urine was yellow in color. The balloon was filled with 48ml of sterile water. A leg bag was attached for drainage and attached to his right leg with a StatLock.  A night bag was also given to the patient and patient was given instruction on how to change from one bag to another. Patient was given proper instruction on catheter care.    Performed by: Debroah Loop, PA-C   Follow up: Return in about 4 weeks (around 08/25/2020) for Catheter exchange.

## 2020-08-03 ENCOUNTER — Inpatient Hospital Stay: Payer: Medicare HMO | Attending: Oncology

## 2020-08-03 ENCOUNTER — Other Ambulatory Visit: Payer: Self-pay

## 2020-08-03 DIAGNOSIS — K4090 Unilateral inguinal hernia, without obstruction or gangrene, not specified as recurrent: Secondary | ICD-10-CM | POA: Diagnosis not present

## 2020-08-03 DIAGNOSIS — M7989 Other specified soft tissue disorders: Secondary | ICD-10-CM | POA: Insufficient documentation

## 2020-08-03 DIAGNOSIS — Z79899 Other long term (current) drug therapy: Secondary | ICD-10-CM | POA: Diagnosis not present

## 2020-08-03 DIAGNOSIS — Z7984 Long term (current) use of oral hypoglycemic drugs: Secondary | ICD-10-CM | POA: Diagnosis not present

## 2020-08-03 DIAGNOSIS — D631 Anemia in chronic kidney disease: Secondary | ICD-10-CM | POA: Diagnosis not present

## 2020-08-03 DIAGNOSIS — Z87442 Personal history of urinary calculi: Secondary | ICD-10-CM | POA: Insufficient documentation

## 2020-08-03 DIAGNOSIS — K219 Gastro-esophageal reflux disease without esophagitis: Secondary | ICD-10-CM | POA: Insufficient documentation

## 2020-08-03 DIAGNOSIS — D509 Iron deficiency anemia, unspecified: Secondary | ICD-10-CM | POA: Diagnosis not present

## 2020-08-03 DIAGNOSIS — E119 Type 2 diabetes mellitus without complications: Secondary | ICD-10-CM | POA: Diagnosis not present

## 2020-08-03 DIAGNOSIS — I129 Hypertensive chronic kidney disease with stage 1 through stage 4 chronic kidney disease, or unspecified chronic kidney disease: Secondary | ICD-10-CM | POA: Insufficient documentation

## 2020-08-03 DIAGNOSIS — E785 Hyperlipidemia, unspecified: Secondary | ICD-10-CM | POA: Insufficient documentation

## 2020-08-03 DIAGNOSIS — N184 Chronic kidney disease, stage 4 (severe): Secondary | ICD-10-CM | POA: Diagnosis not present

## 2020-08-03 DIAGNOSIS — E538 Deficiency of other specified B group vitamins: Secondary | ICD-10-CM | POA: Insufficient documentation

## 2020-08-03 DIAGNOSIS — Z87891 Personal history of nicotine dependence: Secondary | ICD-10-CM | POA: Diagnosis not present

## 2020-08-03 LAB — CBC WITH DIFFERENTIAL/PLATELET
Abs Immature Granulocytes: 0.02 10*3/uL (ref 0.00–0.07)
Basophils Absolute: 0 10*3/uL (ref 0.0–0.1)
Basophils Relative: 0 %
Eosinophils Absolute: 0.4 10*3/uL (ref 0.0–0.5)
Eosinophils Relative: 6 %
HCT: 30.6 % — ABNORMAL LOW (ref 39.0–52.0)
Hemoglobin: 10.4 g/dL — ABNORMAL LOW (ref 13.0–17.0)
Immature Granulocytes: 0 %
Lymphocytes Relative: 31 %
Lymphs Abs: 2.3 10*3/uL (ref 0.7–4.0)
MCH: 27.4 pg (ref 26.0–34.0)
MCHC: 34 g/dL (ref 30.0–36.0)
MCV: 80.5 fL (ref 80.0–100.0)
Monocytes Absolute: 0.9 10*3/uL (ref 0.1–1.0)
Monocytes Relative: 12 %
Neutro Abs: 3.9 10*3/uL (ref 1.7–7.7)
Neutrophils Relative %: 51 %
Platelets: 199 10*3/uL (ref 150–400)
RBC: 3.8 MIL/uL — ABNORMAL LOW (ref 4.22–5.81)
RDW: 13.2 % (ref 11.5–15.5)
WBC: 7.6 10*3/uL (ref 4.0–10.5)
nRBC: 0 % (ref 0.0–0.2)

## 2020-08-03 LAB — VITAMIN B12: Vitamin B-12: 388 pg/mL (ref 180–914)

## 2020-08-03 LAB — IRON AND TIBC
Iron: 112 ug/dL (ref 45–182)
Saturation Ratios: 39 % (ref 17.9–39.5)
TIBC: 284 ug/dL (ref 250–450)
UIBC: 172 ug/dL

## 2020-08-03 LAB — FERRITIN: Ferritin: 62 ng/mL (ref 24–336)

## 2020-08-05 ENCOUNTER — Inpatient Hospital Stay: Payer: Medicare HMO

## 2020-08-05 ENCOUNTER — Inpatient Hospital Stay (HOSPITAL_BASED_OUTPATIENT_CLINIC_OR_DEPARTMENT_OTHER): Payer: Medicare HMO | Admitting: Oncology

## 2020-08-05 ENCOUNTER — Other Ambulatory Visit: Payer: Self-pay

## 2020-08-05 ENCOUNTER — Encounter: Payer: Self-pay | Admitting: Oncology

## 2020-08-05 VITALS — BP 165/69 | HR 65 | Temp 97.0°F | Resp 16 | Wt 141.5 lb

## 2020-08-05 DIAGNOSIS — Z79899 Other long term (current) drug therapy: Secondary | ICD-10-CM | POA: Diagnosis not present

## 2020-08-05 DIAGNOSIS — M7989 Other specified soft tissue disorders: Secondary | ICD-10-CM | POA: Diagnosis not present

## 2020-08-05 DIAGNOSIS — K4090 Unilateral inguinal hernia, without obstruction or gangrene, not specified as recurrent: Secondary | ICD-10-CM | POA: Diagnosis not present

## 2020-08-05 DIAGNOSIS — E538 Deficiency of other specified B group vitamins: Secondary | ICD-10-CM | POA: Diagnosis not present

## 2020-08-05 DIAGNOSIS — D631 Anemia in chronic kidney disease: Secondary | ICD-10-CM

## 2020-08-05 DIAGNOSIS — I129 Hypertensive chronic kidney disease with stage 1 through stage 4 chronic kidney disease, or unspecified chronic kidney disease: Secondary | ICD-10-CM | POA: Diagnosis not present

## 2020-08-05 DIAGNOSIS — D509 Iron deficiency anemia, unspecified: Secondary | ICD-10-CM | POA: Diagnosis not present

## 2020-08-05 DIAGNOSIS — E119 Type 2 diabetes mellitus without complications: Secondary | ICD-10-CM | POA: Diagnosis not present

## 2020-08-05 DIAGNOSIS — N184 Chronic kidney disease, stage 4 (severe): Secondary | ICD-10-CM | POA: Diagnosis not present

## 2020-08-05 MED ORDER — CYANOCOBALAMIN 500 MCG PO TABS: 500.0000 ug | ORAL_TABLET | Freq: Every day | ORAL | 1 refills | Status: DC

## 2020-08-05 NOTE — Progress Notes (Signed)
[facility] Follow Up visit  Patient Care Team: [name], MD as PCP - General (Internal Medicine) Earlie Server, MD as Consulting Physician (Oncology)  REASON FOR VISIT Follow up for treatment of anemia due to CKD.   HISTORY OF PRESENTING ILLNESS: Jay Mathis 86 y.o. male with PMH listed as below who presents for follow up of his treatment of anemia.  Pertinent hematology history includes: Patient has recently established care with nephrologist Dr.Kulluru for CKD and was referred to [place] for anemia work up and treatment. Patient at that time reports feeling fatigue, weakness. Noted black stool. He has inguinal hernia for which he has seen surgery and was not recommended for intervention.  He was found to be functional iron deficiency and he has received 2 doses of Feraheme, tolerated well and four weekly dose of 1031mcg B12 shot.  Procrit was started on 08/15/2017, he received 40,000 units.  09/12/2017 Procrit 20,000 units # s/p 2 does of Feraheme and 4 weekly dose of Vitamin B12 shots. . Iron store is fully replete.  # 03/25/2020 he recently had a fall and acquired hairline fracture of left ankle. Interval History Patient presents for the management of anemia of chronic kidney disease. Patient is accompanied by son. He reports feeling well.  No fatigue.  No new complaints.  Chronic lower extremity swelling unchanged.    Review of Systems  Constitutional: Negative for appetite change, fatigue and unexpected weight change.  Respiratory: Negative for cough and shortness of breath.   Cardiovascular: Positive for leg swelling.  Gastrointestinal: Negative for abdominal distention and abdominal pain.  Genitourinary: Negative for dysuria.   Musculoskeletal: Negative for flank pain.  Skin: Negative for itching and rash.  Neurological: Negative for headaches.  Hematological: Negative for adenopathy.  Psychiatric/Behavioral: Negative for confusion.    MEDICAL  HISTORY: Past Medical History:  Diagnosis Date  . CAD (coronary artery disease)   . CKD (chronic kidney disease) stage 4, GFR 15-29 ml/min (HCC)   . Diabetes mellitus without complication ([place])   . GERD (gastroesophageal reflux disease)   . Hyperlipidemia   . Hypertension   . Iron deficiency anemia 07/30/2017  . Nephrolithiasis     SURGICAL HISTORY: Past Surgical History:  Procedure Laterality Date  . APPENDECTOMY     60 plus years ago  . CORONARY ANGIOPLASTY WITH STENT PLACEMENT    . CORONARY ARTERY BYPASS GRAFT      SOCIAL HISTORY: Social History   Socioeconomic History  . Marital status: Married    Spouse name: Not on file  . Number of children: Not on file  . Years of education: Not on file  . Highest education level: Not on file  Occupational History  . Not on file  Tobacco Use  . Smoking status: Former Research scientist (life sciences)  . Smokeless tobacco: Never Used  . Tobacco comment: quit about 60 years ago 2019 dated  Vaping Use  . Vaping Use: Never used  Substance and Sexual Activity  . Alcohol use: No  . Drug use: No  . Sexual activity: Not Currently  Other Topics Concern  . Not on file  Social History Narrative  . Not on file   Social Determinants of Health   Financial Resource Strain:   . Difficulty of Paying Living Expenses: Not on file  Food Insecurity:   . Worried About Charity fundraiser in the Last Year: Not on file  . Ran Out of Food in the Last Year: Not on file  Transportation  Needs:   . Lack of Transportation (Medical): Not on file  . Lack of Transportation (Non-Medical): Not on file  Physical Activity:   . Days of Exercise per Week: Not on file  . Minutes of Exercise per Session: Not on file  Stress:   . Feeling of Stress : Not on file  Social Connections:   . Frequency of Communication with Friends and Family: Not on file  . Frequency of Social Gatherings with Friends and Family: Not on file  . Attends Religious Services: Not on file  . Active Member  of Clubs or Organizations: Not on file  . Attends Archivist Meetings: Not on file  . Marital Status: Not on file  Intimate Partner Violence:   . Fear of Current or Ex-Partner: Not on file  . Emotionally Abused: Not on file  . Physically Abused: Not on file  . Sexually Abused: Not on file    FAMILY HISTORY Family History  Problem Relation Age of Onset  . Prostate cancer Father   . Prostate cancer Brother     ALLERGIES:  is allergic to sulfa antibiotics.  MEDICATIONS:  Current Outpatient Medications  Medication Sig Dispense Refill  . amLODipine (NORVASC) 10 MG tablet Take 10 mg by mouth daily.    Marland Kitchen atorvastatin (LIPITOR) 10 MG tablet Take 10 mg by mouth at bedtime.     . carvedilol (COREG) 3.125 MG tablet Take 3.125 mg 2 (two) times daily by mouth.    . folic acid (FOLVITE) 1 MG tablet TAKE 1 TABLET BY MOUTH EVERY DAY 90 tablet 4  . furosemide (LASIX) 40 MG tablet Take 40 mg by mouth daily.     Marland Kitchen glimepiride (AMARYL) 2 MG tablet TAKE 1 TABLET BY MOUTH EVERY DAY 90 tablet 1  . mirabegron ER (MYRBETRIQ) 25 MG TB24 tablet Take 1 tablet (25 mg total) by mouth daily. 30 tablet 11  . pantoprazole (PROTONIX) 40 MG tablet Take 40 mg by mouth 2 (two) times daily.    . sodium bicarbonate 650 MG tablet TAKE 1 TABLET BY MOUTH TWICE A DAY 180 tablet 3  . vitamin B-12 (CYANOCOBALAMIN) 500 MCG tablet Take 1 tablet (500 mcg total) by mouth daily. 90 tablet 1  . ergocalciferol (VITAMIN D2) 1.25 MG (50000 UT) capsule ergocalciferol (vitamin D2) 1,250 mcg (50,000 unit) capsule  TAKE 1 CAPSULE BY MOUTH ONE TIME PER WEEK (Patient not taking: Reported on 08/05/2020)    . SHINGRIX injection  (Patient not taking: Reported on 08/05/2020)     No current facility-administered medications for this visit.    PHYSICAL EXAMINATION:  ECOG PERFORMANCE STATUS: 2 - Symptomatic, <50% confined to bed   Vitals:   08/05/20 1008  BP: (!) 165/69  Pulse: 65  Resp: 16  Temp: (!) 97 F (36.1 C)     Filed Weights   08/05/20 1008  Weight: 141 lb 8 oz (64.2 kg)     Physical Exam Constitutional:      General: He is not in acute distress.    Appearance: He is not diaphoretic.  HENT:     Head: Normocephalic and atraumatic.     Nose: Nose normal.     Mouth/Throat:     Pharynx: No oropharyngeal exudate.  Eyes:     General: No scleral icterus.       Left eye: No discharge.     Pupils: Pupils are equal, round, and reactive to light.  Cardiovascular:     Rate and Rhythm: Normal rate and  regular rhythm.     Heart sounds: Murmur heard.   Pulmonary:     Effort: Pulmonary effort is normal. No respiratory distress.     Breath sounds: No rales.  Chest:     Chest wall: No tenderness.  Abdominal:     General: There is no distension.     Palpations: Abdomen is soft. There is no mass.     Tenderness: There is no abdominal tenderness.  Musculoskeletal:        General: Normal range of motion.     Cervical back: Normal range of motion and neck supple.  Skin:    General: Skin is warm and dry.     Findings: No erythema.     Comments: Chronic lower extremity vascular insufficiency hyperpigmentation.  Neurological:     Mental Status: He is alert and oriented to person, place, and time.     Cranial Nerves: No cranial nerve deficit.     Motor: No abnormal muscle tone.     Coordination: Coordination normal.  Psychiatric:        Mood and Affect: Affect normal.       LABORATORY DATA: I have personally reviewed the data as listed:  Appointment on 08/03/2020  Component Date Value Ref Range Status  . Iron 08/03/2020 112  45 - 182 ug/dL Final  . TIBC 08/03/2020 284  250 - 450 ug/dL Final  . Saturation Ratios 08/03/2020 39  17.9 - 39.5 % Final  . UIBC 08/03/2020 172  ug/dL Final   Performed at [facility], [address] [place], [place] [address]  . Vitamin B-12 08/03/2020 388  180 - 914 pg/mL Final   Comment: (NOTE) This assay is not validated for testing neonatal  or myeloproliferative syndrome specimens for Vitamin B12 levels. Performed at [facility], [place] [address] [place], [place] [address]   . Ferritin 08/03/2020 62  24 - 336 ng/mL Final   Performed at [facility], [address] [place], [place] [address]  . WBC 08/03/2020 7.6  4.0 - 10.5 K/uL Final  . RBC 08/03/2020 3.80* 4.22 - 5.81 MIL/uL Final  . Hemoglobin 08/03/2020 10.4* 13.0 - 17.0 g/dL Final  . HCT 08/03/2020 30.6* 39 - 52 % Final  . MCV 08/03/2020 80.5  80.0 - 100.0 fL Final  . MCH 08/03/2020 27.4  26.0 - 34.0 pg Final  . MCHC 08/03/2020 34.0  30.0 - 36.0 g/dL Final  . RDW 08/03/2020 13.2  11.5 - 15.5 % Final  . Platelets 08/03/2020 199  150 - 400 K/uL Final  . nRBC 08/03/2020 0.0  0.0 - 0.2 % Final  . Neutrophils Relative % 08/03/2020 51  % Final  . Neutro Abs 08/03/2020 3.9  1.7 - 7.7 K/uL Final  . Lymphocytes Relative 08/03/2020 31  % Final  . Lymphs Abs 08/03/2020 2.3  0.7 - 4.0 K/uL Final  . Monocytes Relative 08/03/2020 12  % Final  . Monocytes Absolute 08/03/2020 0.9  0 - 1 K/uL Final  . Eosinophils Relative 08/03/2020 6  % Final  . Eosinophils Absolute 08/03/2020 0.4  0 - 0 K/uL Final  . Basophils Relative 08/03/2020 0  % Final  . Basophils Absolute 08/03/2020 0.0  0 - 0 K/uL Final  . Immature Granulocytes 08/03/2020 0  % Final  . Abs Immature Granulocytes 08/03/2020 0.02  0.00 - 0.07 K/uL Final   Performed at [facility], [address], [place], [place] [address]   [facility] results.  07/08/2017  Iron 81, TIBC 298, Saturation 27%, Ferritin 57 Iron/TIBC/Ferritin/ %Sat    Component Value Date/Time   IRON 112 08/03/2020 1058   TIBC 284 08/03/2020 1058   FERRITIN 62 08/03/2020 1058   IRONPCTSAT 39 08/03/2020 1058   Tested negative for anti parietal antibody or intrinsic factor antibody. He is tested negative for occult blood in stool.   RADIOGRAPHIC STUDIES: I have personally reviewed the radiological images as listed and agree with the  findings in the report No results found.   ASSESSMENT/PLAN  1. B12 deficiency   2. CKD (chronic kidney disease) stage 4, GFR 15-29 ml/min (HCC)   3. Anemia in stage 4 chronic kidney disease (HCC)    #Anemia due to CKD.  Labs are reviewed and discussed with patient and son.  Hemoglobin has been stable.  I recommend patient to take over the counter iron supplementation, 3-4 days per week.  Hemoglobin is above 10, no need for Retacrit today.   History of vitamin B12 deficiency.  B12 level is normal but has trended down.  Recommend patient to resume oral B12 544mcg daily.   Patient can follow-up with me in 4 months.   Given the COVID 19 pandemic, will do virtual visit at next visit. Patient is instructed to call us if he wants to switch to in person visit.   Orders Placed This Encounter  Procedures  . CBC with Differential/Platelet    Standing Status:   Future    Standing Expiration Date:   08/05/2021  . Comprehensive metabolic panel    Standing Status:   Future    Standing Expiration Date:   08/05/2021  . Ferritin    Standing Status:   Future    Standing Expiration Date:   08/05/2021  . Iron and TIBC    Standing Status:   Future    Standing Expiration Date:   08/05/2021  . Vitamin B12    Standing Status:   Future    Standing Expiration Date:   08/05/2021   Earlie Server, MD, PhD Hematology Oncology [facility] Pager- [phone] 08/05/20

## 2020-08-05 NOTE — Progress Notes (Signed)
Patient denies new problems/concerns today.   °

## 2020-08-12 ENCOUNTER — Other Ambulatory Visit: Payer: Self-pay

## 2020-08-12 ENCOUNTER — Encounter: Payer: Self-pay | Admitting: Internal Medicine

## 2020-08-12 ENCOUNTER — Ambulatory Visit (INDEPENDENT_AMBULATORY_CARE_PROVIDER_SITE_OTHER): Payer: Medicare HMO | Admitting: Internal Medicine

## 2020-08-12 VITALS — BP 148/84 | HR 67 | Ht 68.0 in | Wt 142.3 lb

## 2020-08-12 DIAGNOSIS — I10 Essential (primary) hypertension: Secondary | ICD-10-CM | POA: Diagnosis not present

## 2020-08-12 DIAGNOSIS — D631 Anemia in chronic kidney disease: Secondary | ICD-10-CM | POA: Diagnosis not present

## 2020-08-12 DIAGNOSIS — N184 Chronic kidney disease, stage 4 (severe): Secondary | ICD-10-CM

## 2020-08-12 DIAGNOSIS — E1122 Type 2 diabetes mellitus with diabetic chronic kidney disease: Secondary | ICD-10-CM

## 2020-08-12 DIAGNOSIS — Z1331 Encounter for screening for depression: Secondary | ICD-10-CM | POA: Diagnosis not present

## 2020-08-12 MED ORDER — CARVEDILOL 3.125 MG PO TABS
3.1250 mg | ORAL_TABLET | Freq: Two times a day (BID) | ORAL | 6 refills | Status: DC
Start: 2020-08-12 — End: 2021-02-03

## 2020-08-12 MED ORDER — FUROSEMIDE 40 MG PO TABS
40.0000 mg | ORAL_TABLET | Freq: Every day | ORAL | 6 refills | Status: DC
Start: 2020-08-12 — End: 2020-12-22

## 2020-08-12 MED ORDER — AMLODIPINE BESYLATE 10 MG PO TABS
10.0000 mg | ORAL_TABLET | Freq: Every day | ORAL | 6 refills | Status: DC
Start: 2020-08-12 — End: 2021-03-06

## 2020-08-12 MED ORDER — PANTOPRAZOLE SODIUM 40 MG PO TBEC: 40.0000 mg | DELAYED_RELEASE_TABLET | Freq: Two times a day (BID) | ORAL | 6 refills | Status: DC

## 2020-08-12 MED ORDER — GLIMEPIRIDE 2 MG PO TABS: 2.0000 mg | ORAL_TABLET | Freq: Every day | ORAL | 6 refills | Status: DC

## 2020-08-12 MED ORDER — ATORVASTATIN CALCIUM 10 MG PO TABS
10.0000 mg | ORAL_TABLET | Freq: Every day | ORAL | 6 refills | Status: DC
Start: 2020-08-12 — End: 2021-02-27

## 2020-08-12 MED ORDER — MIRABEGRON ER 25 MG PO TB24
25.0000 mg | ORAL_TABLET | Freq: Every day | ORAL | 11 refills | Status: DC
Start: 2020-08-12 — End: 2021-09-04

## 2020-08-12 NOTE — Progress Notes (Signed)
Established Patient Office Visit  SUBJECTIVE:  Subjective  Patient ID: Jay Mathis, male    DOB: 04/10/1924  Age: 86 y.o. MRN: [id number]  CC:  Chief Complaint  Patient presents with  . Diabetes    blood sugar 130  . Hypertension    HPI Jay Mathis is a 86 y.o. male presenting today for a general follow up for his diabetes and hypertension.  He is followed by Dr. Tasia Catchings for his chronic anemia secondary to stage 4 CKD and Vitamin B12 deficiency. He was last seen on 08/05/2020 and did not need Retacrit at that time as his Hgb was above 10.   He has been feeling well overall. He uses a walker to get around at home.   He is not vaccinated against COVID19. He does not intent to get it.    Past Medical History:  Diagnosis Date  . CAD (coronary artery disease)   . CKD (chronic kidney disease) stage 4, GFR 15-29 ml/min (HCC)   . Diabetes mellitus without complication ([place])   . GERD (gastroesophageal reflux disease)   . Hyperlipidemia   . Hypertension   . Iron deficiency anemia 07/30/2017  . Nephrolithiasis     Past Surgical History:  Procedure Laterality Date  . APPENDECTOMY     60 plus years ago  . CORONARY ANGIOPLASTY WITH STENT PLACEMENT    . CORONARY ARTERY BYPASS GRAFT      Family History  Problem Relation Age of Onset  . Prostate cancer Father   . Prostate cancer Brother     Social History   Socioeconomic History  . Marital status: Married    Spouse name: Not on file  . Number of children: Not on file  . Years of education: Not on file  . Highest education level: Not on file  Occupational History  . Not on file  Tobacco Use  . Smoking status: Former Research scientist (life sciences)  . Smokeless tobacco: Never Used  . Tobacco comment: quit about 60 years ago 2019 dated  Vaping Use  . Vaping Use: Never used  Substance and Sexual Activity  . Alcohol use: No  . Drug use: No  . Sexual activity: Not Currently  Other Topics Concern  . Not on file  Social History Narrative    . Not on file   Social Determinants of Health   Financial Resource Strain:   . Difficulty of Paying Living Expenses: Not on file  Food Insecurity:   . Worried About Charity fundraiser in the Last Year: Not on file  . Ran Out of Food in the Last Year: Not on file  Transportation Needs:   . Lack of Transportation (Medical): Not on file  . Lack of Transportation (Non-Medical): Not on file  Physical Activity:   . Days of Exercise per Week: Not on file  . Minutes of Exercise per Session: Not on file  Stress:   . Feeling of Stress : Not on file  Social Connections:   . Frequency of Communication with Friends and Family: Not on file  . Frequency of Social Gatherings with Friends and Family: Not on file  . Attends Religious Services: Not on file  . Active Member of Clubs or Organizations: Not on file  . Attends Archivist Meetings: Not on file  . Marital Status: Not on file  Intimate Partner Violence:   . Fear of Current or Ex-Partner: Not on file  . Emotionally Abused: Not on file  . Physically Abused:  Not on file  . Sexually Abused: Not on file     Current Outpatient Medications:  .  amLODipine (NORVASC) 10 MG tablet, Take 1 tablet (10 mg total) by mouth daily., Disp: 30 tablet, Rfl: 6 .  atorvastatin (LIPITOR) 10 MG tablet, Take 1 tablet (10 mg total) by mouth at bedtime., Disp: 30 tablet, Rfl: 6 .  carvedilol (COREG) 3.125 MG tablet, Take 1 tablet (3.125 mg total) by mouth 2 (two) times daily., Disp: 60 tablet, Rfl: 6 .  ergocalciferol (VITAMIN D2) 1.25 MG (50000 UT) capsule, ergocalciferol (vitamin D2) 1,250 mcg (50,000 unit) capsule  TAKE 1 CAPSULE BY MOUTH ONE TIME PER WEEK, Disp: , Rfl:  .  folic acid (FOLVITE) 1 MG tablet, TAKE 1 TABLET BY MOUTH EVERY DAY, Disp: 90 tablet, Rfl: 4 .  furosemide (LASIX) 40 MG tablet, Take 1 tablet (40 mg total) by mouth daily., Disp: 30 tablet, Rfl: 6 .  glimepiride (AMARYL) 2 MG tablet, Take 1 tablet (2 mg total) by mouth daily.,  Disp: 30 tablet, Rfl: 6 .  mirabegron ER (MYRBETRIQ) 25 MG TB24 tablet, Take 1 tablet (25 mg total) by mouth daily., Disp: 30 tablet, Rfl: 11 .  pantoprazole (PROTONIX) 40 MG tablet, Take 1 tablet (40 mg total) by mouth 2 (two) times daily., Disp: 60 tablet, Rfl: 6 .  SHINGRIX injection, , Disp: , Rfl:  .  sodium bicarbonate 650 MG tablet, TAKE 1 TABLET BY MOUTH TWICE A DAY, Disp: 180 tablet, Rfl: 3 .  vitamin B-12 (CYANOCOBALAMIN) 500 MCG tablet, Take 1 tablet (500 mcg total) by mouth daily., Disp: 90 tablet, Rfl: 1   Allergies  Allergen Reactions  . Sulfa Antibiotics Rash    ROS Review of Systems  Constitutional: Negative.   HENT: Negative.   Eyes: Negative.   Respiratory: Negative.   Cardiovascular: Negative.   Gastrointestinal: Negative.   Endocrine: Negative.   Genitourinary: Negative.        Wears catheter all the time  Musculoskeletal: Negative.   Skin: Negative.   Allergic/Immunologic: Negative.   Neurological: Negative.   Hematological: Negative.   Psychiatric/Behavioral: Negative.   All other systems reviewed and are negative.    OBJECTIVE:    Physical Exam Vitals reviewed.  Constitutional:      Appearance: Normal appearance.  HENT:     Mouth/Throat:     Mouth: Mucous membranes are moist.  Eyes:     Pupils: Pupils are equal, round, and reactive to light.  Neck:     Vascular: No carotid bruit.  Cardiovascular:     Rate and Rhythm: Normal rate and regular rhythm.     Pulses: Normal pulses.          Carotid pulses are 2+ on the left side with bruit.    Heart sounds: Murmur heard.  Systolic murmur is present with a grade of 2/6.   Pulmonary:     Effort: Pulmonary effort is normal.     Breath sounds: Normal breath sounds.  Abdominal:     General: Bowel sounds are normal.     Palpations: Abdomen is soft. There is no hepatomegaly, splenomegaly or mass.     Tenderness: There is no abdominal tenderness.     Hernia: No hernia is present.  Musculoskeletal:       Cervical back: Neck supple.     Right lower leg: No edema.     Left lower leg: No edema.  Skin:    Coloration: Skin is pale.     Findings:  No rash.  Neurological:     Mental Status: He is alert and oriented to person, place, and time.     Motor: Weakness present.     Gait: Gait abnormal.  Psychiatric:        Mood and Affect: Mood normal.        Behavior: Behavior normal.     BP (!) 148/84   Pulse 67   Ht 5\' 8"  (1.727 m)   Wt 142 lb 4.8 oz (64.5 kg)   BMI 21.64 kg/m  Wt Readings from Last 3 Encounters:  08/12/20 142 lb 4.8 oz (64.5 kg)  08/05/20 141 lb 8 oz (64.2 kg)  06/27/20 142 lb (64.4 kg)    Health Maintenance Due  Topic Date Due  . FOOT EXAM  Never done  . OPHTHALMOLOGY EXAM  Never done  . URINE MICROALBUMIN  Never done  . COVID-19 Vaccine (1) Never done  . TETANUS/TDAP  Never done  . PNA vac Low Risk Adult (1 of 2 - PCV13) Never done  . HEMOGLOBIN A1C  02/07/2020    There are no preventive care reminders to display for this patient.  CBC Latest Ref Rng & Units 08/03/2020 05/04/2020 03/25/2020  WBC 4.0 - 10.5 K/uL 7.6 6.1 6.6  Hemoglobin 13.0 - 17.0 g/dL 10.4(L) 10.2(L) 9.6(L)  Hematocrit 39 - 52 % 30.6(L) 30.5(L) 29.4(L)  Platelets 150 - 400 K/uL 199 188 230   CMP Latest Ref Rng & Units 03/25/2020 08/10/2019 08/09/2019  Glucose 70 - 99 mg/dL 194(H) 157(H) 189(H)  BUN 8 - 23 mg/dL 35(H) 41(H) 47(H)  Creatinine 0.61 - 1.24 mg/dL 2.47(H) 2.38(H) 2.69(H)  Sodium 135 - 145 mmol/L 132(L) 136 133(L)  Potassium 3.5 - 5.1 mmol/L 3.9 3.7 3.7  Chloride 98 - 111 mmol/L 93(L) 98 96(L)  CO2 22 - 32 mmol/L 28 29 27   Calcium 8.9 - 10.3 mg/dL 9.1 8.5(L) 8.5(L)  Total Protein 6.5 - 8.1 g/dL 7.6 - -  Total Bilirubin 0.3 - 1.2 mg/dL 0.6 - -  Alkaline Phos 38 - 126 U/L 87 - -  AST 15 - 41 U/L 15 - -  ALT 0 - 44 U/L 12 - -    Lab Results  Component Value Date   TSH 11.834 (H) 07/22/2017   Lab Results  Component Value Date   ALBUMIN 3.7 03/25/2020   ANIONGAP 11  03/25/2020   No results found for: CHOL, HDL, LDLCALC, CHOLHDL No results found for: TRIG Lab Results  Component Value Date   HGBA1C 8.0 (H) 08/07/2019   HGBA1C 7.4 (H) 08/05/2016      ASSESSMENT & PLAN:   Problem List Items Addressed This Visit      Cardiovascular and Mediastinum   Essential hypertension    HTN is stable on Coreg.       Relevant Medications   amLODipine (NORVASC) 10 MG tablet   atorvastatin (LIPITOR) 10 MG tablet   carvedilol (COREG) 3.125 MG tablet   furosemide (LASIX) 40 MG tablet     Endocrine   Diabetes ([place]) - Primary    Diabetes stable on amaryl.       Relevant Medications   atorvastatin (LIPITOR) 10 MG tablet   glimepiride (AMARYL) 2 MG tablet     Genitourinary   Anemia in stage 4 chronic kidney disease (HCC)    Pt has chronic anemia, which is stable. He is being followed by hematology. He did not require Retacrit at his last visit.  Other   Encounter for screening for depression    Pt is not depressed clinically. He is not anxious or suicidal. His behavior is normal. He is alert and cooperative in all three spheres.       Relevant Medications   glimepiride (AMARYL) 2 MG tablet    Other Visit Diagnoses    Screening for depression       Relevant Medications   glimepiride (AMARYL) 2 MG tablet      Meds ordered this encounter  Medications  . amLODipine (NORVASC) 10 MG tablet    Sig: Take 1 tablet (10 mg total) by mouth daily.    Dispense:  30 tablet    Refill:  6  . atorvastatin (LIPITOR) 10 MG tablet    Sig: Take 1 tablet (10 mg total) by mouth at bedtime.    Dispense:  30 tablet    Refill:  6  . carvedilol (COREG) 3.125 MG tablet    Sig: Take 1 tablet (3.125 mg total) by mouth 2 (two) times daily.    Dispense:  60 tablet    Refill:  6  . furosemide (LASIX) 40 MG tablet    Sig: Take 1 tablet (40 mg total) by mouth daily.    Dispense:  30 tablet    Refill:  6  . glimepiride (AMARYL) 2 MG tablet    Sig: Take 1  tablet (2 mg total) by mouth daily.    Dispense:  30 tablet    Refill:  6  . mirabegron ER (MYRBETRIQ) 25 MG TB24 tablet    Sig: Take 1 tablet (25 mg total) by mouth daily.    Dispense:  30 tablet    Refill:  11  . pantoprazole (PROTONIX) 40 MG tablet    Sig: Take 1 tablet (40 mg total) by mouth 2 (two) times daily.    Dispense:  60 tablet    Refill:  6    Follow-up: No follow-ups on file.    Dr. Jane Canary [facility] [address], [place], [place] [address]   By signing my name below, I, [employer], attest that this documentation has been prepared under the direction and in the presence of [name], MD. Electronically Signed: [name], MD 08/12/20, 9:34 AM   I personally performed the services described in this documentation, which was SCRIBED in my presence. The recorded information has been reviewed and considered accurate. It has been edited as necessary during review. [name], MD

## 2020-08-12 NOTE — Assessment & Plan Note (Signed)
Pt is not depressed clinically. He is not anxious or suicidal. His behavior is normal. He is alert and cooperative in all three spheres.

## 2020-08-12 NOTE — Assessment & Plan Note (Signed)
Pt has chronic anemia, which is stable. He is being followed by hematology. He did not require Retacrit at his last visit.

## 2020-08-12 NOTE — Assessment & Plan Note (Signed)
Diabetes stable on amaryl.

## 2020-08-12 NOTE — Assessment & Plan Note (Signed)
HTN is stable on Coreg.

## 2020-08-24 ENCOUNTER — Ambulatory Visit: Payer: Medicare HMO | Admitting: Urology

## 2020-08-24 ENCOUNTER — Encounter: Payer: Self-pay | Admitting: Urology

## 2020-08-24 ENCOUNTER — Other Ambulatory Visit: Payer: Self-pay

## 2020-08-24 VITALS — BP 158/69 | HR 67 | Ht 70.0 in | Wt 146.0 lb

## 2020-08-24 DIAGNOSIS — R339 Retention of urine, unspecified: Secondary | ICD-10-CM | POA: Diagnosis not present

## 2020-08-24 NOTE — Progress Notes (Signed)
08/24/2020 9:11 AM   Jay Mathis 07/10/1924 [id number]  Reason for visit: Follow up urinary retention  HPI: I saw Jay Mathis and his son back in urology clinic for discussion of bladder management options again. He is a very comorbid 86-year-old male with CAD, history of CABG, stage IV CKD with baseline creatinine 2.8 (EGFR of 18 )who was admitted in August 2020 with sepsis secondary to a UTI. CT at that time showed bilateral hydronephrosis and a distended bladder with some bladder diverticulum. On review of his prior history this was chronic from at least 2017. His prostate is very enlarged at 96 g. He has a long history of voiding symptoms, multiple recurrent UTIs, and multiple hospitalizations for sepsis from urinary source.  His bladder has been managed with a chronic indwelling Foley since August 2020 and he has overall done well since that time.  He is not had any UTIs or significant hematuria with the catheter in place, and no hospitalizations for UTI or sepsis.  We again reviewed bladder management options including intermittent catheterization or HOLEP.  I think he would be at very high risk for complications, and certainly persistent incontinence after HOLEP.  He is tolerating the catheter well, and is in agreement to continue with monthly Foley changes.  His 16 French coud catheter was changed today easily by nursing.  I reduced the foreskin over the catheter.  Continue bladder management with monthly Foley catheter changes RTC with me 6 months   Jay C Sninsky, MD  [facility] [address], [address] [place], [place] [address] [phone]    

## 2020-08-24 NOTE — Progress Notes (Signed)
Patient ID: Jay Mathis, male   DOB: 26-Apr-1924, 86 y.o.   MRN: [id number] Cath Change/ Replacement  Patient is present today for a catheter change due to urinary retention.  28ml of water was removed from the balloon, a 16FR foley cath was removed with out difficulty.  Patient was cleaned and prepped in a sterile fashion with betadine. A 16 FR foley cath was replaced into the bladder no complications were noted Urine return was noted  52ml and urine was yellow in color. The balloon was filled with 31ml of sterile water. A leg bag was attached for drainage.  A night bag was also given to the patient and patient was given instruction on how to change from one bag to another. Patient was given proper instruction on catheter care.    Performed by: Edwin Dada CMA, Darrick Grinder, CMA  Follow up: 34mth for cath exchange

## 2020-08-24 NOTE — Patient Instructions (Signed)
Indwelling Urinary Catheter Care, Adult An indwelling urinary catheter is a thin tube that is put into your bladder. The tube helps to drain pee (urine) out of your body. The tube goes in through your urethra. Your urethra is where pee comes out of your body. Your pee will come out through the catheter, then it will go into a bag (drainage bag). Take good care of your catheter so it will work well. How to wear your catheter and bag Supplies needed  Sticky tape (adhesive tape) or a leg strap.  Alcohol wipe or soap and water (if you use tape).  A clean towel (if you use tape).  Large overnight bag.  Smaller bag (leg bag). Wearing your catheter Attach your catheter to your leg with tape or a leg strap.  Make sure the catheter is not pulled tight.  If a leg strap gets wet, take it off and put on a dry strap.  If you use tape to hold the bag on your leg: 1. Use an alcohol wipe or soap and water to wash your skin where the tape made it sticky before. 2. Use a clean towel to pat-dry that skin. 3. Use new tape to make the bag stay on your leg. Wearing your bags You should have been given a large overnight bag.  You may wear the overnight bag in the day or night.  Always have the overnight bag lower than your bladder.  Do not let the bag touch the floor.  Before you go to sleep, put a clean plastic bag in a wastebasket. Then hang the overnight bag inside the wastebasket. You should also have a smaller leg bag that fits under your clothes.  Always wear the leg bag below your knee.  Do not wear your leg bag at night. How to care for your skin and catheter Supplies needed  A clean washcloth.  Water and mild soap.  A clean towel. Caring for your skin and catheter      Clean the skin around your catheter every day: 1. Wash your hands with soap and water. 2. Wet a clean washcloth in warm water and mild soap. 3. Clean the skin around your urethra.  If you are  male:  Gently spread the folds of skin around your vagina (labia).  With the washcloth in your other hand, wipe the inner side of your labia on each side. Wipe from front to back.  If you are male:  Pull back any skin that covers the end of your penis (foreskin).  With the washcloth in your other hand, wipe your penis in small circles. Start wiping at the tip of your penis, then move away from the catheter.  Move the foreskin back in place, if needed. 4. With your free hand, hold the catheter close to where it goes into your body.  Keep holding the catheter during cleaning so it does not get pulled out. 5. With the washcloth in your other hand, clean the catheter.  Only wipe downward on the catheter.  Do not wipe upward toward your body. Doing this may push germs into your urethra and cause infection. 6. Use a clean towel to pat-dry the catheter and the skin around it. Make sure to wipe off all soap. 7. Wash your hands with soap and water.  Shower every day. Do not take baths.  Do not use cream, ointment, or lotion on the area where the catheter goes into your body, unless your doctor tells you   to.  Do not use powders, sprays, or lotions on your genital area.  Check your skin around the catheter every day for signs of infection. Check for: ? Redness, swelling, or pain. ? Fluid or blood. ? Warmth. ? Pus or a bad smell. How to empty the bag Supplies needed  Rubbing alcohol.  Gauze pad or cotton ball.  Tape or a leg strap. Emptying the bag Pour the pee out of your bag when it is ?- full, or at least 2-3 times a day. Do this for your overnight bag and your leg bag. 1. Wash your hands with soap and water. 2. Separate (detach) the bag from your leg. 3. Hold the bag over the toilet or a clean pail. Keep the bag lower than your hips and bladder. This is so the pee (urine) does not go back into the tube. 4. Open the pour spout. It is at the bottom of the bag. 5. Empty the  pee into the toilet or pail. Do not let the pour spout touch any surface. 6. Put rubbing alcohol on a gauze pad or cotton ball. 7. Use the gauze pad or cotton ball to clean the pour spout. 8. Close the pour spout. 9. Attach the bag to your leg with tape or a leg strap. 10. Wash your hands with soap and water. Follow instructions for cleaning the drainage bag:  From the product maker.  As told by your doctor. How to change the bag Supplies needed  Alcohol wipes.  A clean bag.  Tape or a leg strap. Changing the bag Replace your bag when it starts to leak, smell bad, or look dirty. 1. Wash your hands with soap and water. 2. Separate the dirty bag from your leg. 3. Pinch the catheter with your fingers so that pee does not spill out. 4. Separate the catheter tube from the bag tube where these tubes connect (at the connection valve). Do not let the tubes touch any surface. 5. Clean the end of the catheter tube with an alcohol wipe. Use a different alcohol wipe to clean the end of the bag tube. 6. Connect the catheter tube to the tube of the clean bag. 7. Attach the clean bag to your leg with tape or a leg strap. Do not make the bag tight on your leg. 8. Wash your hands with soap and water. General rules   Never pull on your catheter. Never try to take it out. Doing that can hurt you.  Always wash your hands before and after you touch your catheter or bag. Use a mild, fragrance-free soap. If you do not have soap and water, use hand sanitizer.  Always make sure there are no twists or bends (kinks) in the catheter tube.  Always make sure there are no leaks in the catheter or bag.  Drink enough fluid to keep your pee pale yellow.  Do not take baths, swim, or use a hot tub.  If you are male, wipe from front to back after you poop (have a bowel movement). Contact a doctor if:  Your pee is cloudy.  Your pee smells worse than usual.  Your catheter gets clogged.  Your catheter  leaks.  Your bladder feels full. Get help right away if:  You have redness, swelling, or pain where the catheter goes into your body.  You have fluid, blood, pus, or a bad smell coming from the area where the catheter goes into your body.  Your skin feels warm where   the catheter goes into your body.  You have a fever.  You have pain in your: ? Belly (abdomen). ? Legs. ? Lower back. ? Bladder.  You see blood in the catheter.  Your pee is pink or red.  You feel sick to your stomach (nauseous).  You throw up (vomit).  You have chills.  Your pee is not draining into the bag.  Your catheter gets pulled out. Summary  An indwelling urinary catheter is a thin tube that is placed into the bladder to help drain pee (urine) out of the body.  The catheter is placed into the part of the body that drains pee from the bladder (urethra).  Taking good care of your catheter will keep it working properly and help prevent problems.  Always wash your hands before and after touching your catheter or bag.  Never pull on your catheter or try to take it out. This information is not intended to replace advice given to you by your health care provider. Make sure you discuss any questions you have with your health care provider. Document Revised: 03/20/2019 Document Reviewed: 07/12/2017 Elsevier Patient Education  2020 [employer] Inc[employer]  

## 2020-08-29 ENCOUNTER — Ambulatory Visit: Payer: Self-pay | Admitting: Physician Assistant

## 2020-09-21 ENCOUNTER — Ambulatory Visit (INDEPENDENT_AMBULATORY_CARE_PROVIDER_SITE_OTHER): Payer: Medicare HMO | Admitting: Internal Medicine

## 2020-09-21 ENCOUNTER — Other Ambulatory Visit: Payer: Self-pay

## 2020-09-21 ENCOUNTER — Encounter: Payer: Self-pay | Admitting: Internal Medicine

## 2020-09-21 VITALS — BP 113/88 | HR 79 | Ht 72.0 in | Wt 143.0 lb

## 2020-09-21 DIAGNOSIS — I10 Essential (primary) hypertension: Secondary | ICD-10-CM | POA: Diagnosis not present

## 2020-09-21 DIAGNOSIS — E038 Other specified hypothyroidism: Secondary | ICD-10-CM

## 2020-09-21 DIAGNOSIS — I2510 Atherosclerotic heart disease of native coronary artery without angina pectoris: Secondary | ICD-10-CM

## 2020-09-21 DIAGNOSIS — N184 Chronic kidney disease, stage 4 (severe): Secondary | ICD-10-CM | POA: Diagnosis not present

## 2020-09-21 DIAGNOSIS — D631 Anemia in chronic kidney disease: Secondary | ICD-10-CM

## 2020-09-21 DIAGNOSIS — E063 Autoimmune thyroiditis: Secondary | ICD-10-CM | POA: Diagnosis not present

## 2020-09-21 DIAGNOSIS — L02619 Cutaneous abscess of unspecified foot: Secondary | ICD-10-CM | POA: Diagnosis not present

## 2020-09-21 DIAGNOSIS — L03119 Cellulitis of unspecified part of limb: Secondary | ICD-10-CM | POA: Diagnosis not present

## 2020-09-21 MED ORDER — LEVOTHYROXINE SODIUM 50 MCG PO TABS: 50.0000 ug | ORAL_TABLET | Freq: Every day | ORAL | 3 refills | Status: DC

## 2020-09-21 MED ORDER — CEPHALEXIN 250 MG PO CAPS: 250.0000 mg | ORAL_CAPSULE | Freq: Four times a day (QID) | ORAL | 1 refills | Status: AC

## 2020-09-21 NOTE — Assessment & Plan Note (Signed)
Blood pressure is stable there is no evidence of hypotension.  Patient gait is unsteady due to neuro degenerative disease.  His memory is good.  He also has an indwelling Foley catheter.

## 2020-09-21 NOTE — Progress Notes (Signed)
Established Patient Office Visit  SUBJECTIVE:  Subjective  Patient ID: Jay Mathis, male    DOB: 10/29/1924  Age: 86 y.o. MRN: [id number]  CC:  Chief Complaint  Patient presents with   Cellulitis    patient has swelling and oozing of legs     HPI Jay Mathis is a 86 y.o. male presenting today for an evaluation of swelling and oozing of legs.  He is seen by a home health nurse once a week. He has developed some dermatitis and cellulitis on his left leg and was told by the nurse to come in and get some medication to treat it.   He utilizes a walker at home. He does not have to worry about getting up to go to the bathroom at night because of his urinary cath.    Past Medical History:  Diagnosis Date   CAD (coronary artery disease)    CKD (chronic kidney disease) stage 4, GFR 15-29 ml/min (HCC)    Diabetes mellitus without complication (HCC)    GERD (gastroesophageal reflux disease)    Hyperlipidemia    Hypertension    Iron deficiency anemia 07/30/2017   Nephrolithiasis     Past Surgical History:  Procedure Laterality Date   APPENDECTOMY     60 plus years ago   CORONARY ANGIOPLASTY WITH STENT PLACEMENT     CORONARY ARTERY BYPASS GRAFT      Family History  Problem Relation Age of Onset   Prostate cancer Father    Prostate cancer Brother     Social History   Socioeconomic History   Marital status: Married    Spouse name: Not on file   Number of children: Not on file   Years of education: Not on file   Highest education level: Not on file  Occupational History   Not on file  Tobacco Use   Smoking status: Former Smoker   Smokeless tobacco: Never Used   Tobacco comment: quit about 60 years ago 2019 dated  Vaping Use   Vaping Use: Never used  Substance and Sexual Activity   Alcohol use: No   Drug use: No   Sexual activity: Not Currently  Other Topics Concern   Not on file  Social History Narrative   Not on file    Social Determinants of Health   Financial Resource Strain:    Difficulty of Paying Living Expenses: Not on file  Food Insecurity:    Worried About Charity fundraiser in the Last Year: Not on file   [employer] of Food in the Last Year: Not on file  Transportation Needs:    Lack of Transportation (Medical): Not on file   Lack of Transportation (Non-Medical): Not on file  Physical Activity:    Days of Exercise per Week: Not on file   Minutes of Exercise per Session: Not on file  Stress:    Feeling of Stress : Not on file  Social Connections:    Frequency of Communication with Friends and Family: Not on file   Frequency of Social Gatherings with Friends and Family: Not on file   Attends Religious Services: Not on file   Active Member of Clubs or Organizations: Not on file   Attends Archivist Meetings: Not on file   Marital Status: Not on file  Intimate Partner Violence:    Fear of Current or Ex-Partner: Not on file   Emotionally Abused: Not on file   Physically Abused: Not on file  Sexually Abused: Not on file     Current Outpatient Medications:    amLODipine (NORVASC) 10 MG tablet, Take 1 tablet (10 mg total) by mouth daily., Disp: 30 tablet, Rfl: 6   atorvastatin (LIPITOR) 10 MG tablet, Take 1 tablet (10 mg total) by mouth at bedtime., Disp: 30 tablet, Rfl: 6   carvedilol (COREG) 3.125 MG tablet, Take 1 tablet (3.125 mg total) by mouth 2 (two) times daily., Disp: 60 tablet, Rfl: 6   ergocalciferol (VITAMIN D2) 1.25 MG (50000 UT) capsule, ergocalciferol (vitamin D2) 1,250 mcg (50,000 unit) capsule  TAKE 1 CAPSULE BY MOUTH ONE TIME PER WEEK, Disp: , Rfl:    folic acid (FOLVITE) 1 MG tablet, TAKE 1 TABLET BY MOUTH EVERY DAY, Disp: 90 tablet, Rfl: 4   furosemide (LASIX) 40 MG tablet, Take 1 tablet (40 mg total) by mouth daily., Disp: 30 tablet, Rfl: 6   glimepiride (AMARYL) 2 MG tablet, Take 1 tablet (2 mg total) by mouth daily., Disp: 30 tablet,  Rfl: 6   mirabegron ER (MYRBETRIQ) 25 MG TB24 tablet, Take 1 tablet (25 mg total) by mouth daily., Disp: 30 tablet, Rfl: 11   pantoprazole (PROTONIX) 40 MG tablet, Take 1 tablet (40 mg total) by mouth 2 (two) times daily., Disp: 60 tablet, Rfl: 6   sodium bicarbonate 650 MG tablet, TAKE 1 TABLET BY MOUTH TWICE A DAY, Disp: 180 tablet, Rfl: 3   vitamin B-12 (CYANOCOBALAMIN) 500 MCG tablet, Take 1 tablet (500 mcg total) by mouth daily., Disp: 90 tablet, Rfl: 1   cephALEXin (KEFLEX) 250 MG capsule, Take 1 capsule (250 mg total) by mouth 4 (four) times daily for 5 days., Disp: 20 capsule, Rfl: 1   Allergies  Allergen Reactions   Sulfa Antibiotics Rash    ROS Review of Systems  Constitutional: Negative.   HENT: Positive for hearing loss.   Eyes: Negative.   Respiratory: Negative.  Negative for cough.   Cardiovascular: Negative.  Negative for chest pain.  Gastrointestinal: Negative.  Negative for constipation.  Endocrine: Negative.   Genitourinary: Negative.        Urinary cath. in place  Musculoskeletal: Negative.   Skin: Positive for color change (L foot & leg) and wound (L foot).  Allergic/Immunologic: Negative.   Neurological: Negative.   Hematological: Negative.   Psychiatric/Behavioral: Negative.   All other systems reviewed and are negative.    OBJECTIVE:    Physical Exam Vitals reviewed.  Constitutional:      Appearance: Normal appearance. He is normal weight.     Interventions: Face mask in place.     Comments: Ambulates with a cane  HENT:     Right Ear: Decreased hearing noted.     Left Ear: Decreased hearing (hearing aid in place) noted.     Mouth/Throat:     Mouth: Mucous membranes are moist.  Eyes:     Pupils: Pupils are equal, round, and reactive to light.  Neck:     Vascular: No carotid bruit.  Cardiovascular:     Rate and Rhythm: Normal rate and regular rhythm.     Pulses: Normal pulses.     Heart sounds: Normal heart sounds.  Pulmonary:      Effort: Pulmonary effort is normal.     Breath sounds: Normal breath sounds.  Abdominal:     General: Bowel sounds are normal.     Palpations: Abdomen is soft. There is no hepatomegaly, splenomegaly or mass.     Tenderness: There is no abdominal tenderness.  Hernia: No hernia is present.  Musculoskeletal:     Cervical back: Neck supple.     Right lower leg: No edema.     Left lower leg: No edema.  Feet:     Comments: Cellulitis on L foot Skin:    Findings: No rash.     Comments: Cellulitis on L foot. Dermatitis on R & L leg.   Neurological:     Mental Status: He is alert and oriented to person, place, and time.     Motor: No weakness.  Psychiatric:        Mood and Affect: Mood and affect normal.        Behavior: Behavior normal.        Cognition and Memory: Cognition and memory normal.     BP 113/88    Pulse 79    Ht 6' (1.829 m)    Wt 143 lb (64.9 kg)    BMI 19.39 kg/m  Wt Readings from Last 3 Encounters:  09/21/20 143 lb (64.9 kg)  08/24/20 146 lb (66.2 kg)  08/12/20 142 lb 4.8 oz (64.5 kg)    Health Maintenance Due  Topic Date Due   FOOT EXAM  Never done   OPHTHALMOLOGY EXAM  Never done   URINE MICROALBUMIN  Never done   COVID-19 Vaccine (1) Never done   TETANUS/TDAP  Never done   PNA vac Low Risk Adult (1 of 2 - PCV13) Never done   HEMOGLOBIN A1C  02/07/2020    There are no preventive care reminders to display for this patient.  CBC Latest Ref Rng & Units 08/03/2020 05/04/2020 03/25/2020  WBC 4.0 - 10.5 K/uL 7.6 6.1 6.6  Hemoglobin 13.0 - 17.0 g/dL 10.4(L) 10.2(L) 9.6(L)  Hematocrit 39 - 52 % 30.6(L) 30.5(L) 29.4(L)  Platelets 150 - 400 K/uL 199 188 230   CMP Latest Ref Rng & Units 03/25/2020 08/10/2019 08/09/2019  Glucose 70 - 99 mg/dL 194(H) 157(H) 189(H)  BUN 8 - 23 mg/dL 35(H) 41(H) 47(H)  Creatinine 0.61 - 1.24 mg/dL 2.47(H) 2.38(H) 2.69(H)  Sodium 135 - 145 mmol/L 132(L) 136 133(L)  Potassium 3.5 - 5.1 mmol/L 3.9 3.7 3.7  Chloride 98 - 111  mmol/L 93(L) 98 96(L)  CO2 22 - 32 mmol/L 28 29 27   Calcium 8.9 - 10.3 mg/dL 9.1 8.5(L) 8.5(L)  Total Protein 6.5 - 8.1 g/dL 7.6 - -  Total Bilirubin 0.3 - 1.2 mg/dL 0.6 - -  Alkaline Phos 38 - 126 U/L 87 - -  AST 15 - 41 U/L 15 - -  ALT 0 - 44 U/L 12 - -    Lab Results  Component Value Date   TSH 11.834 (H) 07/22/2017   Lab Results  Component Value Date   ALBUMIN 3.7 03/25/2020   ANIONGAP 11 03/25/2020   No results found for: CHOL, HDL, LDLCALC, CHOLHDL No results found for: TRIG Lab Results  Component Value Date   HGBA1C 8.0 (H) 08/07/2019   HGBA1C 7.4 (H) 08/05/2016      ASSESSMENT & PLAN:   Problem List Items Addressed This Visit      Cardiovascular and Mediastinum   CAD (coronary artery disease) (Chronic)    Patient denies chest pain or shortness of breath.      Essential hypertension    Blood pressure is stable there is no evidence of hypotension.  Patient gait is unsteady due to neuro degenerative disease.  His memory is good.  He also has an indwelling Foley catheter.  Genitourinary   CKD (chronic kidney disease) stage 4, GFR 15-29 ml/min (HCC)    CKD is stable at the present time.  Patient was advised to not use any Advil.      Anemia in stage 4 chronic kidney disease ([place])    Patient hemoglobin is normal.  There is no signs or symptoms of GI bleed.        Other   Cellulitis and abscess of foot, except toes - Primary    On examination of the left foot patient was found to have serpiginous ulcer on the dorsum of the left foot.  He also has evidence of dermatitis of the left leg.  I started the patient on Keflex 250 mg p.o. 4 times a day for 5 days.  In the meantime will make an appointment for him to be seen by vascular specialist.  It is to be noted that patient has a history of coronary artery disease.  Pedal pulses are very feeble.      Relevant Medications   cephALEXin (KEFLEX) 250 MG capsule      Meds ordered this encounter    Medications   cephALEXin (KEFLEX) 250 MG capsule    Sig: Take 1 capsule (250 mg total) by mouth 4 (four) times daily for 5 days.    Dispense:  20 capsule    Refill:  1    Follow-up: No follow-ups on file.    [name], MD [facility] [address], [place], [place] [address]   By signing my name below, I, [employer], attest that this documentation has been prepared under the direction and in the presence of Dr. [name] Electronically Signed: [name], MD 09/21/20, 10:28 AM  I personally performed the services described in this documentation, which was SCRIBED in my presence. The recorded information has been reviewed and considered accurate. It has been edited as necessary during review. [name], MD

## 2020-09-21 NOTE — Assessment & Plan Note (Signed)
On examination of the left foot patient was found to have serpiginous ulcer on the dorsum of the left foot.  He also has evidence of dermatitis of the left leg.  I started the patient on Keflex 250 mg p.o. 4 times a day for 5 days.  In the meantime will make an appointment for him to be seen by vascular specialist.  It is to be noted that patient has a history of coronary artery disease.  Pedal pulses are very feeble.

## 2020-09-21 NOTE — Assessment & Plan Note (Signed)
Patient denies chest pain or shortness of breath

## 2020-09-21 NOTE — Assessment & Plan Note (Signed)
Patient hemoglobin is normal.  There is no signs or symptoms of GI bleed.

## 2020-09-21 NOTE — Assessment & Plan Note (Signed)
CKD is stable at the present time.  Patient was advised to not use any Advil.

## 2020-09-23 ENCOUNTER — Other Ambulatory Visit: Payer: Self-pay

## 2020-09-23 ENCOUNTER — Encounter: Payer: Self-pay | Admitting: Physician Assistant

## 2020-09-23 ENCOUNTER — Ambulatory Visit (INDEPENDENT_AMBULATORY_CARE_PROVIDER_SITE_OTHER): Payer: Medicare HMO | Admitting: Physician Assistant

## 2020-09-23 VITALS — BP 150/61 | HR 61 | Ht 70.0 in | Wt 140.0 lb

## 2020-09-23 DIAGNOSIS — R339 Retention of urine, unspecified: Secondary | ICD-10-CM | POA: Diagnosis not present

## 2020-09-23 NOTE — Progress Notes (Signed)
Cath Change/ Replacement  Patient is present today for a catheter change due to urinary retention.  31ml of water was removed from the balloon, a 16FR coude foley cath was removed without difficulty.  Patient was cleaned and prepped in a sterile fashion with betadine. A 16 FR coude foley cath was replaced into the bladder no complications were noted Urine return was noted 27ml and urine was yellow in color. The balloon was filled with 70ml of sterile water. A leg bag was attached for drainage.  A night bag was also given to the patient.    Performed by: Debroah Loop, PA-C   Follow up: Return in about 4 weeks (around 10/21/2020) for Catheter exchange.

## 2020-10-03 ENCOUNTER — Ambulatory Visit: Payer: Medicare HMO | Admitting: Internal Medicine

## 2020-10-21 ENCOUNTER — Ambulatory Visit (INDEPENDENT_AMBULATORY_CARE_PROVIDER_SITE_OTHER): Payer: Medicare HMO | Admitting: Physician Assistant

## 2020-10-21 ENCOUNTER — Encounter: Payer: Self-pay | Admitting: Physician Assistant

## 2020-10-21 ENCOUNTER — Other Ambulatory Visit: Payer: Self-pay

## 2020-10-21 VITALS — BP 161/64 | HR 70

## 2020-10-21 DIAGNOSIS — R339 Retention of urine, unspecified: Secondary | ICD-10-CM

## 2020-10-21 NOTE — Progress Notes (Signed)
Cath Change/ Replacement  Patient is present today for a catheter change due to urinary retention.  38ml of water was removed from the balloon, a 16FR coude foley cath was removed without difficulty.  Patient was cleaned and prepped in a sterile fashion with betadine. A 16 FR coude foley cath was replaced into the bladder no complications were noted Urine return was noted 46ml and urine was yellow in color. The balloon was filled with 61ml of sterile water. A leg bag was attached for drainage.  Patient tolerated well.    Performed by: Debroah Loop, PA-C   Follow up: Return in about 4 weeks (around 11/18/2020) for Catheter exchange.

## 2020-11-21 ENCOUNTER — Encounter: Payer: Self-pay | Admitting: Physician Assistant

## 2020-11-21 ENCOUNTER — Ambulatory Visit: Payer: Medicare HMO | Admitting: Physician Assistant

## 2020-11-21 ENCOUNTER — Other Ambulatory Visit: Payer: Self-pay

## 2020-11-21 VITALS — BP 169/63 | HR 67 | Ht 70.0 in

## 2020-11-21 DIAGNOSIS — R339 Retention of urine, unspecified: Secondary | ICD-10-CM | POA: Diagnosis not present

## 2020-11-21 NOTE — Progress Notes (Signed)
Cath Change/ Replacement  Patient is present today for a catheter change due to urinary retention.  45ml of water was removed from the balloon, a 16FR coude foley cath was removed without difficulty.  The foreskin was retracted, patient was cleaned and prepped in a sterile fashion with betadine, and 2% lidocaine jelly was instilled into the urethra. A 16 FR coude foley cath was replaced into the bladder no complications were noted Urine return was noted 47ml and urine was yellow in color. The balloon was filled with 45ml of sterile water and the foreskin was reduced. A leg bag was attached for drainage. Patient tolerated well.    Performed by: Debroah Loop, PA-C   Follow up: Return in about 1 month (around 12/22/2020) for Cath exchange.

## 2020-11-22 ENCOUNTER — Ambulatory Visit: Payer: Medicare HMO | Admitting: Cardiology

## 2020-12-05 ENCOUNTER — Inpatient Hospital Stay: Payer: Medicare HMO

## 2020-12-07 ENCOUNTER — Inpatient Hospital Stay: Payer: Medicare HMO | Admitting: Oncology

## 2020-12-19 ENCOUNTER — Inpatient Hospital Stay: Payer: Medicare HMO | Attending: Oncology

## 2020-12-19 DIAGNOSIS — I129 Hypertensive chronic kidney disease with stage 1 through stage 4 chronic kidney disease, or unspecified chronic kidney disease: Secondary | ICD-10-CM | POA: Insufficient documentation

## 2020-12-19 DIAGNOSIS — N184 Chronic kidney disease, stage 4 (severe): Secondary | ICD-10-CM | POA: Insufficient documentation

## 2020-12-19 DIAGNOSIS — D631 Anemia in chronic kidney disease: Secondary | ICD-10-CM | POA: Diagnosis not present

## 2020-12-19 LAB — COMPREHENSIVE METABOLIC PANEL
ALT: 10 U/L (ref 0–44)
AST: 14 U/L — ABNORMAL LOW (ref 15–41)
Albumin: 3.9 g/dL (ref 3.5–5.0)
Alkaline Phosphatase: 74 U/L (ref 38–126)
Anion gap: 11 (ref 5–15)
BUN: 35 mg/dL — ABNORMAL HIGH (ref 8–23)
CO2: 26 mmol/L (ref 22–32)
Calcium: 9.3 mg/dL (ref 8.9–10.3)
Chloride: 92 mmol/L — ABNORMAL LOW (ref 98–111)
Creatinine, Ser: 2.54 mg/dL — ABNORMAL HIGH (ref 0.61–1.24)
GFR, Estimated: 23 mL/min — ABNORMAL LOW (ref >60)
Glucose, Bld: 217 mg/dL — ABNORMAL HIGH (ref 70–99)
Potassium: 4.2 mmol/L (ref 3.5–5.1)
Sodium: 129 mmol/L — ABNORMAL LOW (ref 135–145)
Total Bilirubin: 0.6 mg/dL (ref 0.3–1.2)
Total Protein: 7.8 g/dL (ref 6.5–8.1)

## 2020-12-19 LAB — CBC WITH DIFFERENTIAL/PLATELET
Abs Immature Granulocytes: 0.03 10*3/uL (ref 0.00–0.07)
Basophils Absolute: 0 10*3/uL (ref 0.0–0.1)
Basophils Relative: 0 %
Eosinophils Absolute: 0.4 10*3/uL (ref 0.0–0.5)
Eosinophils Relative: 5 %
HCT: 28.9 % — ABNORMAL LOW (ref 39.0–52.0)
Hemoglobin: 10 g/dL — ABNORMAL LOW (ref 13.0–17.0)
Immature Granulocytes: 0 %
Lymphocytes Relative: 26 %
Lymphs Abs: 2 10*3/uL (ref 0.7–4.0)
MCH: 27.8 pg (ref 26.0–34.0)
MCHC: 34.6 g/dL (ref 30.0–36.0)
MCV: 80.3 fL (ref 80.0–100.0)
Monocytes Absolute: 0.8 10*3/uL (ref 0.1–1.0)
Monocytes Relative: 10 %
Neutro Abs: 4.5 10*3/uL (ref 1.7–7.7)
Neutrophils Relative %: 59 %
Platelets: 233 10*3/uL (ref 150–400)
RBC: 3.6 MIL/uL — ABNORMAL LOW (ref 4.22–5.81)
RDW: 13.1 % (ref 11.5–15.5)
WBC: 7.8 10*3/uL (ref 4.0–10.5)
nRBC: 0 % (ref 0.0–0.2)

## 2020-12-19 LAB — FERRITIN: Ferritin: 53 ng/mL (ref 24–336)

## 2020-12-19 LAB — IRON AND TIBC
Iron: 67 ug/dL (ref 45–182)
Saturation Ratios: 20 % (ref 17.9–39.5)
TIBC: 342 ug/dL (ref 250–450)
UIBC: 275 ug/dL

## 2020-12-19 LAB — VITAMIN B12: Vitamin B-12: 843 pg/mL (ref 180–914)

## 2020-12-22 ENCOUNTER — Other Ambulatory Visit: Payer: Self-pay

## 2020-12-22 ENCOUNTER — Encounter: Payer: Self-pay | Admitting: Physician Assistant

## 2020-12-22 ENCOUNTER — Ambulatory Visit (INDEPENDENT_AMBULATORY_CARE_PROVIDER_SITE_OTHER): Payer: Medicare HMO | Admitting: Physician Assistant

## 2020-12-22 VITALS — BP 128/87

## 2020-12-22 DIAGNOSIS — R339 Retention of urine, unspecified: Secondary | ICD-10-CM | POA: Diagnosis not present

## 2020-12-22 NOTE — Progress Notes (Signed)
Cath Change/ Replacement  Patient is present today for a catheter change due to urinary retention.  23ml of water was removed from the balloon, a 16FR coude foley cath was removed without difficulty.  Patient was cleaned and prepped in a sterile fashion with betadine and 2% lidocaine jelly was instilled into the urethra. A 16 FR coude foley cath was replaced into the bladder no complications were noted Urine return was noted 65ml and urine was yellow in color. The balloon was filled with 51ml of sterile water. A leg bag was attached for drainage. Patient tolerated well.  Performed by: Debroah Loop, PA-C   Follow up: Return in about 4 weeks (around 01/19/2021) for Catheter exchange.

## 2020-12-23 ENCOUNTER — Encounter: Payer: Self-pay | Admitting: Oncology

## 2020-12-23 ENCOUNTER — Inpatient Hospital Stay (HOSPITAL_BASED_OUTPATIENT_CLINIC_OR_DEPARTMENT_OTHER): Payer: Medicare HMO | Admitting: Oncology

## 2020-12-23 DIAGNOSIS — D631 Anemia in chronic kidney disease: Secondary | ICD-10-CM | POA: Diagnosis not present

## 2020-12-23 DIAGNOSIS — E871 Hypo-osmolality and hyponatremia: Secondary | ICD-10-CM

## 2020-12-23 DIAGNOSIS — N184 Chronic kidney disease, stage 4 (severe): Secondary | ICD-10-CM

## 2020-12-23 NOTE — Progress Notes (Signed)
HEMATOLOGY-ONCOLOGY TeleHEALTH VISIT PROGRESS NOTE  I connected with [name] on 12/23/20  @ at  2:30 PM EST by video enabled telemedicine visit and verified that I am speaking with the correct person using two identifiers. I discussed the limitations, risks, security and privacy concerns of performing an evaluation and management service by telemedicine and the availability of in-person appointments. The patient expressed understanding and agreed to proceed.   Other persons participating in the visit and their role in the encounter:  Son [name] who helps him to set up virtual visit.  Patient's location: Home  Provider's location: office Chief Complaint: Anemia   INTERVAL HISTORY Jay Mathis is a 87 y.o. male who has above history reviewed by me today presents for follow up visit for management of anemia Problems and complaints are listed below:  He reports feeling well. No new complaints.   Review of Systems  Constitutional: Positive for fatigue. Negative for appetite change, chills, fever and unexpected weight change.  HENT:   Negative for hearing loss and voice change.   Eyes: Negative for eye problems and icterus.  Respiratory: Negative for chest tightness, cough and shortness of breath.   Cardiovascular: Negative for chest pain and leg swelling.  Gastrointestinal: Negative for abdominal distention and abdominal pain.  Endocrine: Negative for hot flashes.  Genitourinary: Negative for difficulty urinating, dysuria and frequency.   Musculoskeletal: Negative for arthralgias.  Skin: Negative for itching and rash.  Neurological: Negative for light-headedness and numbness.  Hematological: Negative for adenopathy. Does not bruise/bleed easily.  Psychiatric/Behavioral: Negative for confusion.    Past Medical History:  Diagnosis Date  . CAD (coronary artery disease)   . CKD (chronic kidney disease) stage 4, GFR 15-29 ml/min (HCC)   . Diabetes mellitus without complication  ([place])   . GERD (gastroesophageal reflux disease)   . Hyperlipidemia   . Hypertension   . Iron deficiency anemia 07/30/2017  . Nephrolithiasis    Past Surgical History:  Procedure Laterality Date  . APPENDECTOMY     60 plus years ago  . CORONARY ANGIOPLASTY WITH STENT PLACEMENT    . CORONARY ARTERY BYPASS GRAFT      Family History  Problem Relation Age of Onset  . Prostate cancer Father   . Prostate cancer Brother     Social History   Socioeconomic History  . Marital status: Married    Spouse name: Not on file  . Number of children: Not on file  . Years of education: Not on file  . Highest education level: Not on file  Occupational History  . Not on file  Tobacco Use  . Smoking status: Former Research scientist (life sciences)  . Smokeless tobacco: Never Used  . Tobacco comment: quit about 60 years ago 2019 dated  Vaping Use  . Vaping Use: Never used  Substance and Sexual Activity  . Alcohol use: No  . Drug use: No  . Sexual activity: Not Currently  Other Topics Concern  . Not on file  Social History Narrative  . Not on file   Social Determinants of Health   Financial Resource Strain: Not on file  Food Insecurity: Not on file  Transportation Needs: Not on file  Physical Activity: Not on file  Stress: Not on file  Social Connections: Not on file  Intimate Partner Violence: Not on file    Current Outpatient Medications on File Prior to Visit  Medication Sig Dispense Refill  . amLODipine (NORVASC) 10 MG tablet Take 1 tablet (10 mg total) by  mouth daily. 30 tablet 6  . atorvastatin (LIPITOR) 10 MG tablet Take 1 tablet (10 mg total) by mouth at bedtime. 30 tablet 6  . carvedilol (COREG) 3.125 MG tablet Take 1 tablet (3.125 mg total) by mouth 2 (two) times daily. 60 tablet 6  . folic acid (FOLVITE) 1 MG tablet TAKE 1 TABLET BY MOUTH EVERY DAY 90 tablet 4  . glimepiride (AMARYL) 2 MG tablet Take 1 tablet (2 mg total) by mouth daily. 30 tablet 6  . mirabegron ER (MYRBETRIQ) 25 MG TB24 tablet  Take 1 tablet (25 mg total) by mouth daily. 30 tablet 11  . pantoprazole (PROTONIX) 40 MG tablet Take 1 tablet (40 mg total) by mouth 2 (two) times daily. 60 tablet 6  . sodium bicarbonate 650 MG tablet TAKE 1 TABLET BY MOUTH TWICE A DAY 180 tablet 3  . vitamin B-12 (CYANOCOBALAMIN) 500 MCG tablet Take 1 tablet (500 mcg total) by mouth daily. 90 tablet 1   No current facility-administered medications on file prior to visit.    Allergies  Allergen Reactions  . Sulfa Antibiotics Rash       Observations/Objective: Today's Vitals   12/23/20 1441  PainSc: 0-No pain   There is no height or weight on file to calculate BMI.  Physical Exam  CBC    Component Value Date/Time   WBC 7.8 12/19/2020 1111   RBC 3.60 (L) 12/19/2020 1111   HGB 10.0 (L) 12/19/2020 1111   HCT 28.9 (L) 12/19/2020 1111   PLT 233 12/19/2020 1111   MCV 80.3 12/19/2020 1111   MCH 27.8 12/19/2020 1111   MCHC 34.6 12/19/2020 1111   RDW 13.1 12/19/2020 1111   LYMPHSABS 2.0 12/19/2020 1111   MONOABS 0.8 12/19/2020 1111   EOSABS 0.4 12/19/2020 1111   BASOSABS 0.0 12/19/2020 1111    CMP     Component Value Date/Time   NA 129 (L) 12/19/2020 1111   K 4.2 12/19/2020 1111   CL 92 (L) 12/19/2020 1111   CO2 26 12/19/2020 1111   GLUCOSE 217 (H) 12/19/2020 1111   BUN 35 (H) 12/19/2020 1111   CREATININE 2.54 (H) 12/19/2020 1111   CALCIUM 9.3 12/19/2020 1111   PROT 7.8 12/19/2020 1111   ALBUMIN 3.9 12/19/2020 1111   AST 14 (L) 12/19/2020 1111   ALT 10 12/19/2020 1111   ALKPHOS 74 12/19/2020 1111   BILITOT 0.6 12/19/2020 1111   GFRNONAA 23 (L) 12/19/2020 1111   GFRAA 25 (L) 03/25/2020 1358     Assessment and Plan: 1. Anemia in stage 4 chronic kidney disease ([place])   2. CKD (chronic kidney disease) stage 4, GFR 15-29 ml/min (HCC)   3. Hyponatremia     Labs are reviewed and discussed with patient. Hb is 10.  Recommend to repeat H&H in 6 weeks +/- Retacrit. If Hb is <10  CKD, creatinine is stable.   Hyponatremia, Na is 129, monitor, encourage oral hydration If persistently low, consider chest imaging. Former smoker.    Follow Up Instructions: 3 months.    I discussed the assessment and treatment plan with the patient. The patient was provided an opportunity to ask questions and all were answered. The patient agreed with the plan and demonstrated an understanding of the instructions.  The patient was advised to call back or seek an in-person evaluation if the symptoms worsen or if the condition fails to improve as anticipated.    Earlie Server, MD 12/23/2020 10:36 PM

## 2021-01-11 ENCOUNTER — Other Ambulatory Visit: Payer: Self-pay | Admitting: Internal Medicine

## 2021-01-24 ENCOUNTER — Other Ambulatory Visit: Payer: Self-pay

## 2021-01-24 ENCOUNTER — Ambulatory Visit (INDEPENDENT_AMBULATORY_CARE_PROVIDER_SITE_OTHER): Payer: Medicare HMO | Admitting: Physician Assistant

## 2021-01-24 DIAGNOSIS — R339 Retention of urine, unspecified: Secondary | ICD-10-CM

## 2021-01-24 NOTE — Progress Notes (Signed)
Cath Change/ Replacement  Patient is present today for a catheter change due to urinary retention.  64m of water was removed from the balloon, a 16FR coude foley cath was removed without difficulty.  Patient was cleaned and prepped in a sterile fashion with betadine. A 16 FR coude foley cath was replaced into the bladder no complications were noted Urine return was noted 329mand urine was clear in color. The balloon was filled with 1061mf sterile water. A leg bag was attached for drainage.  Patient tolerated well.   Performed by: SamDebroah LoopA-C   Follow up: Return in about 4 weeks (around 02/21/2021) for Catheter exchange.

## 2021-02-01 ENCOUNTER — Telehealth: Payer: Self-pay | Admitting: Oncology

## 2021-02-01 NOTE — Telephone Encounter (Signed)
It is recommended he keep lab/inj appointments but it seems they prefer to cancel.  Please give them a call to see if they want to r/s the appts.

## 2021-02-01 NOTE — Telephone Encounter (Signed)
Please Advise

## 2021-02-01 NOTE — Telephone Encounter (Signed)
FYI.... I spoke with pts son [name] he stated that due to having  other appts sched he needed to have the 02/03/21 lab/Inj appt R/S 1 week out. Pts 02/03/21 Appts for lab/Retacrit has been R/S to 02/10/21

## 2021-02-01 NOTE — Telephone Encounter (Signed)
Son would like to cancel the labs and Retacrit inj appt on 2/25. He stated to call him back if you needed too. I have not removed the encounter.

## 2021-02-03 ENCOUNTER — Other Ambulatory Visit: Payer: Self-pay | Admitting: Internal Medicine

## 2021-02-03 ENCOUNTER — Inpatient Hospital Stay: Payer: Medicare HMO

## 2021-02-10 ENCOUNTER — Inpatient Hospital Stay: Payer: Medicare HMO | Attending: Oncology

## 2021-02-10 ENCOUNTER — Other Ambulatory Visit: Payer: Self-pay

## 2021-02-10 ENCOUNTER — Inpatient Hospital Stay: Payer: Medicare HMO

## 2021-02-10 DIAGNOSIS — N184 Chronic kidney disease, stage 4 (severe): Secondary | ICD-10-CM | POA: Diagnosis not present

## 2021-02-10 DIAGNOSIS — D631 Anemia in chronic kidney disease: Secondary | ICD-10-CM | POA: Insufficient documentation

## 2021-02-10 DIAGNOSIS — I129 Hypertensive chronic kidney disease with stage 1 through stage 4 chronic kidney disease, or unspecified chronic kidney disease: Secondary | ICD-10-CM | POA: Insufficient documentation

## 2021-02-10 LAB — HEMOGLOBIN AND HEMATOCRIT, BLOOD
HCT: 32.4 % — ABNORMAL LOW (ref 39.0–52.0)
Hemoglobin: 10.7 g/dL — ABNORMAL LOW (ref 13.0–17.0)

## 2021-02-22 ENCOUNTER — Encounter: Payer: Self-pay | Admitting: Urology

## 2021-02-22 ENCOUNTER — Ambulatory Visit: Payer: Medicare HMO | Admitting: Urology

## 2021-02-22 ENCOUNTER — Other Ambulatory Visit: Payer: Self-pay

## 2021-02-22 VITALS — BP 157/63 | HR 67 | Ht 70.0 in

## 2021-02-22 DIAGNOSIS — R339 Retention of urine, unspecified: Secondary | ICD-10-CM | POA: Diagnosis not present

## 2021-02-22 DIAGNOSIS — N138 Other obstructive and reflux uropathy: Secondary | ICD-10-CM

## 2021-02-22 DIAGNOSIS — N401 Enlarged prostate with lower urinary tract symptoms: Secondary | ICD-10-CM | POA: Diagnosis not present

## 2021-02-22 NOTE — Progress Notes (Signed)
Cath Change/ Replacement  Patient is present today for a catheter change due to urinary retention.  47m of water was removed from the balloon, a 16FR Coude foley cath was removed with out difficulty.  Patient was cleaned and prepped in a sterile fashion with betadine. A 16FR Coude foley cath was replaced into the bladder no complications were noted. Urine return was noted 521mand urine was pale yellow in color. The balloon was filled with 1034mf sterile water. A leg bag was attached for drainage.  A night bag was also given to the patient and patient was given instruction on how to change from one bag to another. Patient was given proper instruction on catheter care.    Performed by: OkiGordy ClementMA   Follow up: RTC in 1 month for cath change.

## 2021-02-22 NOTE — Progress Notes (Signed)
   02/22/2021 4:27 PM   [name] 1924/01/10 [id number]  Reason for visit: Follow up BPH with urinary retention, CKD  HPI: I saw Mr. Standing and his son back in urology clinic for discussion of bladder management options again. He is a very comorbid 87 year old male with CAD, history of CABG, stage IV CKD with baseline creatinine 2.8 (EGFR of 18 )who was admitted in August 2020 with sepsis secondary to a UTI. CT at that time showed bilateral hydronephrosis and a distended bladder with some bladder diverticulum. On review of his prior history this was chronic from at least 2017. His prostate is very enlarged at 96 g. He has a long history of voiding symptoms, multiple recurrent UTIs, and multiple hospitalizations for sepsis from urinary source.  His bladder has been managed with a chronic indwelling Foley since August 2020 and he has overall done well since that time. He is not had any UTIs or significant hematuria with the catheter in place, and no hospitalizations for UTI or sepsis.  We again reviewed bladder management options including intermittent catheterization or HOLEP.  I think he would be at very high risk for complications, and certainly persistent incontinence after HOLEP.  He is tolerating the catheter well, and is in agreement to continue with monthly Foley changes.  His 62 French coud catheter was changed today easily by nursing.  Continue monthly follow-up with PA for nurse visit for Foley change Yearly visit with me to check in  Billey [place], [place] [address], [place], [place] [address] [phone]

## 2021-02-23 ENCOUNTER — Ambulatory Visit: Payer: Self-pay | Admitting: Urology

## 2021-02-26 ENCOUNTER — Other Ambulatory Visit: Payer: Self-pay | Admitting: Internal Medicine

## 2021-03-05 ENCOUNTER — Other Ambulatory Visit: Payer: Self-pay | Admitting: Internal Medicine

## 2021-03-05 DIAGNOSIS — Z1331 Encounter for screening for depression: Secondary | ICD-10-CM

## 2021-03-08 ENCOUNTER — Other Ambulatory Visit: Payer: Self-pay | Admitting: Internal Medicine

## 2021-03-20 ENCOUNTER — Telehealth: Payer: Self-pay | Admitting: Oncology

## 2021-03-20 NOTE — Telephone Encounter (Signed)
Patient's son called and wanted to cancel his father's appts on 4/15. When asked to reschedule he declined and stated that he would call back sometime in "June or July".

## 2021-03-22 ENCOUNTER — Other Ambulatory Visit: Payer: Self-pay | Admitting: Oncology

## 2021-03-22 DIAGNOSIS — N184 Chronic kidney disease, stage 4 (severe): Secondary | ICD-10-CM

## 2021-03-22 DIAGNOSIS — D631 Anemia in chronic kidney disease: Secondary | ICD-10-CM

## 2021-03-22 MED ORDER — CYANOCOBALAMIN 500 MCG PO TABS: 500.0000 ug | ORAL_TABLET | Freq: Every day | ORAL | 1 refills | Status: DC

## 2021-03-23 ENCOUNTER — Other Ambulatory Visit: Payer: Self-pay

## 2021-03-23 ENCOUNTER — Ambulatory Visit (INDEPENDENT_AMBULATORY_CARE_PROVIDER_SITE_OTHER): Payer: Medicare HMO | Admitting: Physician Assistant

## 2021-03-23 DIAGNOSIS — R339 Retention of urine, unspecified: Secondary | ICD-10-CM

## 2021-03-23 NOTE — Progress Notes (Signed)
Cath Change/ Replacement  Patient is present today for a catheter change due to urinary retention.  The foreskin was retracted, 52m of water was removed from the balloon, a 16FR coude foley cath was removed without difficulty and 2% lidocaine jelly was instilled into the urethra.  Patient was cleaned and prepped in a sterile fashion with betadine. A 16 FR coude foley cath was replaced into the bladder no complications were noted Urine return was noted 253mand urine was clear in color. The balloon was filled with 1062mf sterile water and the foreskin was reduced. A leg bag was attached for drainage.      Performed by: SamDebroah LoopA-C   Follow up: Return in about 4 weeks (around 04/20/2021) for Catheter exchange.

## 2021-03-24 ENCOUNTER — Ambulatory Visit: Payer: Medicare HMO | Admitting: Oncology

## 2021-03-24 ENCOUNTER — Other Ambulatory Visit: Payer: Medicare HMO

## 2021-03-24 ENCOUNTER — Ambulatory Visit: Payer: Medicare HMO

## 2021-04-20 ENCOUNTER — Ambulatory Visit: Payer: Medicare HMO | Admitting: Physician Assistant

## 2021-04-20 ENCOUNTER — Other Ambulatory Visit: Payer: Self-pay

## 2021-04-20 DIAGNOSIS — R339 Retention of urine, unspecified: Secondary | ICD-10-CM | POA: Diagnosis not present

## 2021-04-20 NOTE — Progress Notes (Signed)
Cath Change/ Replacement  Patient is present today for a catheter change due to urinary retention.  42m of water was removed from the balloon, a 16FR coude foley cath was removed without difficulty.  Patient was cleaned and prepped in a sterile fashion with betadine and 2% lidocaine jelly was instilled into the urethra. A 16 FR coude foley cath was replaced into the bladder no complications were noted Urine return was noted 231mand urine was clear in color. The balloon was filled with 1016mf sterile water. A leg bag was attached for drainage.  A night bag was also given to the patient.    Performed by: SamDebroah LoopA-C   Follow up: Return in about 4 weeks (around 05/18/2021) for Catheter exchange.

## 2021-04-27 ENCOUNTER — Other Ambulatory Visit: Payer: Self-pay | Admitting: Oncology

## 2021-05-18 ENCOUNTER — Ambulatory Visit (INDEPENDENT_AMBULATORY_CARE_PROVIDER_SITE_OTHER): Payer: Medicare HMO | Admitting: Physician Assistant

## 2021-05-18 ENCOUNTER — Other Ambulatory Visit: Payer: Self-pay

## 2021-05-18 DIAGNOSIS — R339 Retention of urine, unspecified: Secondary | ICD-10-CM | POA: Diagnosis not present

## 2021-05-18 NOTE — Progress Notes (Signed)
Cath Change/ Replacement  Patient is present today for a catheter change due to urinary retention.  42m of water was removed from the balloon, a 16FR coude foley cath was removed without difficulty.  Patient was cleaned and prepped in a sterile fashion with betadine and 2% lidocaine jelly was instilled into the urethra. A 16 FR coude foley cath was replaced into the bladder no complications were noted Urine return was noted 256mand urine was clear in color. The balloon was filled with 108mf sterile water. A leg bag was attached for drainage.  Patient tolerated well.    Performed by: SamDebroah LoopA-C   Follow up: Return in about 4 weeks (around 06/15/2021) for Catheter exchange.

## 2021-06-08 ENCOUNTER — Other Ambulatory Visit: Payer: Self-pay | Admitting: Internal Medicine

## 2021-06-08 DIAGNOSIS — Z1331 Encounter for screening for depression: Secondary | ICD-10-CM

## 2021-06-16 ENCOUNTER — Ambulatory Visit (INDEPENDENT_AMBULATORY_CARE_PROVIDER_SITE_OTHER): Payer: Medicare HMO | Admitting: Physician Assistant

## 2021-06-16 ENCOUNTER — Other Ambulatory Visit: Payer: Self-pay

## 2021-06-16 DIAGNOSIS — R339 Retention of urine, unspecified: Secondary | ICD-10-CM

## 2021-06-16 NOTE — Progress Notes (Signed)
Cath Change/ Replacement  Patient is present today for a catheter change due to urinary retention.  20m of water was removed from the balloon, a 16FR coude foley cath was removed without difficulty.  Patient was cleaned and prepped in a sterile fashion with betadine and 2% lidocaine jelly was instilled into the urethra. A 16 FR coude foley cath was replaced into the bladder no complications were noted Urine return was noted 170mand urine was clear in color. The balloon was filled with 1078mf sterile water. A leg bag was attached for drainage.  A night bag was also given to the patient.  Patient tolerated well.  Performed by: SamDebroah LoopA-C   Follow up: Return in about 4 weeks (around 07/14/2021) for Catheter exchange.

## 2021-07-14 ENCOUNTER — Ambulatory Visit: Payer: Medicare HMO | Admitting: Physician Assistant

## 2021-07-14 ENCOUNTER — Other Ambulatory Visit: Payer: Self-pay

## 2021-07-14 DIAGNOSIS — R339 Retention of urine, unspecified: Secondary | ICD-10-CM | POA: Diagnosis not present

## 2021-07-14 NOTE — Progress Notes (Signed)
Cath Change/ Replacement  Patient is present today for a catheter change due to urinary retention.  32m of water was removed from the balloon, a 16FR coude foley cath was removed without difficulty.  Patient was cleaned and prepped in a sterile fashion with betadine and 2% lidocaine jelly was instilled into the urethra. A 16 FR coude foley cath was replaced into the bladder no complications were noted Urine return was noted 328mand urine was clear in color. The balloon was filled with 1025mf sterile water. A leg bag was attached for drainage.  Patient tolerated well.    Performed by: SamDebroah LoopA-C   Follow up: Return in about 4 weeks (around 08/11/2021) for Catheter exchange.

## 2021-07-19 ENCOUNTER — Other Ambulatory Visit: Payer: Self-pay

## 2021-07-19 ENCOUNTER — Ambulatory Visit (INDEPENDENT_AMBULATORY_CARE_PROVIDER_SITE_OTHER): Payer: Medicare HMO | Admitting: Internal Medicine

## 2021-07-19 ENCOUNTER — Encounter: Payer: Self-pay | Admitting: Internal Medicine

## 2021-07-19 VITALS — BP 120/80

## 2021-07-19 DIAGNOSIS — I2510 Atherosclerotic heart disease of native coronary artery without angina pectoris: Secondary | ICD-10-CM | POA: Diagnosis not present

## 2021-07-19 DIAGNOSIS — L03116 Cellulitis of left lower limb: Secondary | ICD-10-CM

## 2021-07-19 DIAGNOSIS — D631 Anemia in chronic kidney disease: Secondary | ICD-10-CM

## 2021-07-19 DIAGNOSIS — I10 Essential (primary) hypertension: Secondary | ICD-10-CM | POA: Diagnosis not present

## 2021-07-19 DIAGNOSIS — N184 Chronic kidney disease, stage 4 (severe): Secondary | ICD-10-CM

## 2021-07-19 DIAGNOSIS — K219 Gastro-esophageal reflux disease without esophagitis: Secondary | ICD-10-CM

## 2021-07-19 DIAGNOSIS — E1122 Type 2 diabetes mellitus with diabetic chronic kidney disease: Secondary | ICD-10-CM | POA: Diagnosis not present

## 2021-07-19 MED ORDER — CEPHALEXIN 250 MG PO CAPS: 250.0000 mg | ORAL_CAPSULE | Freq: Three times a day (TID) | ORAL | 0 refills | Status: DC

## 2021-07-19 NOTE — Assessment & Plan Note (Signed)
Stable at the present time. 

## 2021-07-19 NOTE — Assessment & Plan Note (Signed)
We will check CBC and CMP

## 2021-07-19 NOTE — Assessment & Plan Note (Signed)

## 2021-07-19 NOTE — Assessment & Plan Note (Signed)
We will start the patient on Keflex 250 mg p.o. 3 times a day for 7 days I will see him back in 3 to 4 days.  He was advised to go to the hospital if pain get worse or swelling increases, we will get a CBC and complete metabolic panel today

## 2021-07-19 NOTE — Progress Notes (Signed)
Established Patient Office Visit  Subjective:  Patient ID: Jay Mathis, male    DOB: [birth year]  Age: 87 y.o. MRN: [id number]  CC:  Chief Complaint  Patient presents with   Cellulitis    Left leg     HPI  Jay Mathis presents for pain and swelling of the left leg.  Past Medical History:  Diagnosis Date   CAD (coronary artery disease)    CKD (chronic kidney disease) stage 4, GFR 15-29 ml/min (HCC)    Diabetes mellitus without complication (HCC)    GERD (gastroesophageal reflux disease)    Hyperlipidemia    Hypertension    Iron deficiency anemia 07/30/2017   Nephrolithiasis     Past Surgical History:  Procedure Laterality Date   APPENDECTOMY     60 plus years ago   CORONARY ANGIOPLASTY WITH STENT PLACEMENT     CORONARY ARTERY BYPASS GRAFT      Family History  Problem Relation Age of Onset   Prostate cancer Father    Prostate cancer Brother     Social History   Socioeconomic History   Marital status: Married    Spouse name: Not on file   Number of children: Not on file   Years of education: Not on file   Highest education level: Not on file  Occupational History   Not on file  Tobacco Use   Smoking status: Former   Smokeless tobacco: Never   Tobacco comments:    quit about 60 years ago 2019 dated  Vaping Use   Vaping Use: Never used  Substance and Sexual Activity   Alcohol use: No   Drug use: No   Sexual activity: Not Currently  Other Topics Concern   Not on file  Social History Narrative   Not on file   Social Determinants of Health   Financial Resource Strain: Not on file  Food Insecurity: Not on file  Transportation Needs: Not on file  Physical Activity: Not on file  Stress: Not on file  Social Connections: Not on file  Intimate Partner Violence: Not on file     Current Outpatient Medications:    cephALEXin (KEFLEX) 250 MG capsule, Take 1 capsule (250 mg total) by mouth 3 (three) times daily., Disp: 21 capsule, Rfl: 0    amLODipine (NORVASC) 10 MG tablet, TAKE 1 TABLET BY MOUTH EVERY DAY, Disp: 90 tablet, Rfl: 2   atorvastatin (LIPITOR) 10 MG tablet, TAKE 1 TABLET BY MOUTH EVERYDAY AT BEDTIME, Disp: 90 tablet, Rfl: 2   carvedilol (COREG) 3.125 MG tablet, TAKE 1 TABLET (3.125 MG TOTAL) BY MOUTH 2 (TWO) TIMES DAILY., Disp: 180 tablet, Rfl: 2   folic acid (FOLVITE) 1 MG tablet, TAKE 1 TABLET BY MOUTH EVERY DAY, Disp: 90 tablet, Rfl: 4   furosemide (LASIX) 40 MG tablet, TAKE 1 TABLET BY MOUTH EVERY DAY, Disp: 90 tablet, Rfl: 2   glimepiride (AMARYL) 2 MG tablet, TAKE 1 TABLET BY MOUTH EVERY DAY, Disp: 90 tablet, Rfl: 2   mirabegron ER (MYRBETRIQ) 25 MG TB24 tablet, Take 1 tablet (25 mg total) by mouth daily., Disp: 30 tablet, Rfl: 11   pantoprazole (PROTONIX) 40 MG tablet, TAKE 1 TABLET BY MOUTH TWICE A DAY, Disp: 180 tablet, Rfl: 2   sodium bicarbonate 650 MG tablet, TAKE 1 TABLET BY MOUTH TWICE A DAY, Disp: 180 tablet, Rfl: 3   vitamin B-12 (CYANOCOBALAMIN) 500 MCG tablet, Take 1 tablet (500 mcg total) by mouth daily., Disp: 90 tablet, Rfl: 1  Allergies  Allergen Reactions   Sulfa Antibiotics Rash    ROS Review of Systems  Constitutional: Negative.   HENT: Negative.    Eyes: Negative.  Negative for discharge.  Respiratory: Negative.  Negative for chest tightness.   Cardiovascular: Negative.   Gastrointestinal: Negative.   Musculoskeletal: Negative.        Pain and swelling of the left leg.  There is no calf tenderness.  Left foot is warm, Homans' sign is negative  Skin: Negative.   All other systems reviewed and are negative.    Objective:    Physical Exam Musculoskeletal:       Legs:     Comments: Swelling and pain of the left leg.  Left foot is warm peripheral pulses are weakly palpable    There were no vitals taken for this visit. Wt Readings from Last 3 Encounters:  09/23/20 140 lb (63.5 kg)  09/21/20 143 lb (64.9 kg)  08/24/20 146 lb (66.2 kg)     Health Maintenance Due  Topic Date  Due   COVID-19 Vaccine (1) Never done   FOOT EXAM  Never done   OPHTHALMOLOGY EXAM  Never done   URINE MICROALBUMIN  Never done   TETANUS/TDAP  Never done   Zoster Vaccines- Shingrix (1 of 2) Never done   PNA vac Low Risk Adult (1 of 2 - PCV13) Never done   HEMOGLOBIN A1C  02/07/2020    There are no preventive care reminders to display for this patient.  Lab Results  Component Value Date   TSH 11.834 (H) 07/22/2017   Lab Results  Component Value Date   WBC 7.8 12/19/2020   HGB 10.7 (L) 02/10/2021   HCT 32.4 (L) 02/10/2021   MCV 80.3 12/19/2020   PLT 233 12/19/2020   Lab Results  Component Value Date   NA 129 (L) 12/19/2020   K 4.2 12/19/2020   CO2 26 12/19/2020   GLUCOSE 217 (H) 12/19/2020   BUN 35 (H) 12/19/2020   CREATININE 2.54 (H) 12/19/2020   BILITOT 0.6 12/19/2020   ALKPHOS 74 12/19/2020   AST 14 (L) 12/19/2020   ALT 10 12/19/2020   PROT 7.8 12/19/2020   ALBUMIN 3.9 12/19/2020   CALCIUM 9.3 12/19/2020   ANIONGAP 11 12/19/2020   No results found for: CHOL No results found for: HDL No results found for: LDLCALC No results found for: TRIG No results found for: CHOLHDL Lab Results  Component Value Date   HGBA1C 8.0 (H) 08/07/2019      Assessment & Plan:   Problem List Items Addressed This Visit       Cardiovascular and Mediastinum   CAD (coronary artery disease) (Chronic)   Essential hypertension - Primary     Digestive   GERD (gastroesophageal reflux disease)    Stable at the present time         Endocrine   Diabetes ([place])    - The patient's blood sugar is labile on med. - The patient will continue the current treatment regimen.  - I encouraged the patient to regularly check blood sugar.  - I encouraged the patient to monitor diet. I encouraged the patient to eat low-carb and low-sugar to help prevent blood sugar spikes.  - I encouraged the patient to continue following their prescribed treatment plan for diabetes - I informed the patient  to get help if blood sugar drops below '54mg'$ /dL, or if suddenly have trouble thinking clearly or breathing.  Patient was advised to buy a book  on diabetes from a local bookstore or from [place]  Patient should read 2 chapters every day to keep the motivation going, this is in addition to some of the materials we provided them from the office.  There are other resources on the Internet like YouTube and wilkipedia to get an education on the diabetes         Genitourinary   Anemia in stage 4 chronic kidney disease (HCC)    We will check CBC and CMP         Other   Cellulitis of left lower extremity    We will start the patient on Keflex 250 mg p.o. 3 times a day for 7 days I will see him back in 3 to 4 days.  He was advised to go to the hospital if pain get worse or swelling increases, we will get a CBC and complete metabolic panel today       Relevant Medications   cephALEXin (KEFLEX) 250 MG capsule    Meds ordered this encounter  Medications   cephALEXin (KEFLEX) 250 MG capsule    Sig: Take 1 capsule (250 mg total) by mouth 3 (three) times daily.    Dispense:  21 capsule    Refill:  0    Follow-up: No follow-ups on file.    [name], MD

## 2021-07-20 LAB — CBC WITH DIFFERENTIAL/PLATELET
Absolute Monocytes: 746 cells/uL (ref 200–950)
Basophils Absolute: 28 cells/uL (ref 0–200)
Basophils Relative: 0.4 %
Eosinophils Absolute: 298 cells/uL (ref 15–500)
Eosinophils Relative: 4.2 %
HCT: 32.2 % — ABNORMAL LOW (ref 38.5–50.0)
Hemoglobin: 10.7 g/dL — ABNORMAL LOW (ref 13.2–17.1)
Lymphs Abs: 2187 cells/uL (ref 850–3900)
MCH: 27 pg (ref 27.0–33.0)
MCHC: 33.2 g/dL (ref 32.0–36.0)
MCV: 81.3 fL (ref 80.0–100.0)
MPV: 11.2 fL (ref 7.5–12.5)
Monocytes Relative: 10.5 %
Neutro Abs: 3841 cells/uL (ref 1500–7800)
Neutrophils Relative %: 54.1 %
Platelets: 250 10*3/uL (ref 140–400)
RBC: 3.96 10*6/uL — ABNORMAL LOW (ref 4.20–5.80)
RDW: 13.6 % (ref 11.0–15.0)
Total Lymphocyte: 30.8 %
WBC: 7.1 10*3/uL (ref 3.8–10.8)

## 2021-07-20 LAB — COMPLETE METABOLIC PANEL WITH GFR
AG Ratio: 1.2 (calc) (ref 1.0–2.5)
ALT: 9 U/L (ref 9–46)
AST: 12 U/L (ref 10–35)
Albumin: 4.1 g/dL (ref 3.6–5.1)
Alkaline phosphatase (APISO): 85 U/L (ref 35–144)
BUN/Creatinine Ratio: 14 (calc) (ref 6–22)
BUN: 33 mg/dL — ABNORMAL HIGH (ref 7–25)
CO2: 28 mmol/L (ref 20–32)
Calcium: 9.6 mg/dL (ref 8.6–10.3)
Chloride: 96 mmol/L — ABNORMAL LOW (ref 98–110)
Creat: 2.28 mg/dL — ABNORMAL HIGH (ref 0.70–1.22)
Globulin: 3.3 g/dL (calc) (ref 1.9–3.7)
Glucose, Bld: 162 mg/dL — ABNORMAL HIGH (ref 65–99)
Potassium: 4.3 mmol/L (ref 3.5–5.3)
Sodium: 135 mmol/L (ref 135–146)
Total Bilirubin: 0.7 mg/dL (ref 0.2–1.2)
Total Protein: 7.4 g/dL (ref 6.1–8.1)
eGFR: 26 mL/min/{1.73_m2} — ABNORMAL LOW (ref >=60)

## 2021-07-31 ENCOUNTER — Ambulatory Visit (INDEPENDENT_AMBULATORY_CARE_PROVIDER_SITE_OTHER): Payer: Medicare HMO | Admitting: Internal Medicine

## 2021-07-31 ENCOUNTER — Other Ambulatory Visit: Payer: Self-pay

## 2021-07-31 VITALS — BP 166/74 | HR 77 | Ht 70.0 in | Wt 142.1 lb

## 2021-07-31 DIAGNOSIS — M7989 Other specified soft tissue disorders: Secondary | ICD-10-CM

## 2021-07-31 DIAGNOSIS — I10 Essential (primary) hypertension: Secondary | ICD-10-CM

## 2021-07-31 DIAGNOSIS — K219 Gastro-esophageal reflux disease without esophagitis: Secondary | ICD-10-CM | POA: Diagnosis not present

## 2021-07-31 DIAGNOSIS — I2510 Atherosclerotic heart disease of native coronary artery without angina pectoris: Secondary | ICD-10-CM

## 2021-07-31 NOTE — Progress Notes (Signed)
mb

## 2021-08-11 ENCOUNTER — Ambulatory Visit: Payer: Medicare HMO | Admitting: Physician Assistant

## 2021-08-11 ENCOUNTER — Other Ambulatory Visit: Payer: Self-pay

## 2021-08-11 DIAGNOSIS — R339 Retention of urine, unspecified: Secondary | ICD-10-CM

## 2021-08-11 NOTE — Progress Notes (Signed)
Cath Change/ Replacement  Patient is present today for a catheter change due to urinary retention.  75m of water was removed from the balloon, a 16FR coude foley cath was removed without difficulty.  Patient was cleaned and prepped in a sterile fashion with betadine and 2% lidocaine jelly was instilled into the urethra. A 16 FR coude foley cath was replaced into the bladder no complications were noted Urine return was noted 353mand urine was yellow in color. The balloon was filled with 1048mf sterile water. A leg bag was attached for drainage.  Patient tolerated well.    Performed by: SamDebroah LoopA-C   Follow up: Return in about 4 weeks (around 09/08/2021) for Catheter exchange.

## 2021-08-14 ENCOUNTER — Encounter: Payer: Self-pay | Admitting: Internal Medicine

## 2021-08-14 DIAGNOSIS — M7989 Other specified soft tissue disorders: Secondary | ICD-10-CM | POA: Insufficient documentation

## 2021-08-14 NOTE — Assessment & Plan Note (Signed)
-   The patient's GERD is stable on medication.  - Instructed the patient to avoid eating spicy and acidic foods, as well as foods high in fat. - Instructed the patient to avoid eating large meals or meals 2-3 hours prior to sleeping. 

## 2021-08-14 NOTE — Assessment & Plan Note (Signed)
Denies chest pain or shortness of breath chest is clear heart is regular there is no pedal edema

## 2021-08-14 NOTE — Assessment & Plan Note (Signed)
Blood pressure is stable 

## 2021-08-14 NOTE — Assessment & Plan Note (Signed)
Swelling of the leg is better.  He has changes of chronic dermatitis, there is no evidence of any active cellulitis

## 2021-08-14 NOTE — Progress Notes (Signed)
Established Patient Office Visit  Subjective:  Patient ID: Jay Mathis, male    DOB: 18-Jul-1924  Age: 87 y.o. MRN: [id number]  CC:  Chief Complaint  Patient presents with   Follow-up    Patient is here for cellulitis of left leg. Patient has finished his antibiotics, patient states his leg has improved.    HPI  Jay Mathis presents for checkup for cellulitis  Past Medical History:  Diagnosis Date   CAD (coronary artery disease)    CKD (chronic kidney disease) stage 4, GFR 15-29 ml/min (HCC)    Diabetes mellitus without complication (HCC)    GERD (gastroesophageal reflux disease)    Hyperlipidemia    Hypertension    Iron deficiency anemia 07/30/2017   Nephrolithiasis     Past Surgical History:  Procedure Laterality Date   APPENDECTOMY     60 plus years ago   CORONARY ANGIOPLASTY WITH STENT PLACEMENT     CORONARY ARTERY BYPASS GRAFT      Family History  Problem Relation Age of Onset   Prostate cancer Father    Prostate cancer Brother     Social History   Socioeconomic History   Marital status: Married    Spouse name: Not on file   Number of children: Not on file   Years of education: Not on file   Highest education level: Not on file  Occupational History   Not on file  Tobacco Use   Smoking status: Former   Smokeless tobacco: Never   Tobacco comments:    quit about 60 years ago 2019 dated  Vaping Use   Vaping Use: Never used  Substance and Sexual Activity   Alcohol use: No   Drug use: No   Sexual activity: Not Currently  Other Topics Concern   Not on file  Social History Narrative   Not on file   Social Determinants of Health   Financial Resource Strain: Not on file  Food Insecurity: Not on file  Transportation Needs: Not on file  Physical Activity: Not on file  Stress: Not on file  Social Connections: Not on file  Intimate Partner Violence: Not on file     Current Outpatient Medications:    amLODipine (NORVASC) 10 MG tablet,  TAKE 1 TABLET BY MOUTH EVERY DAY, Disp: 90 tablet, Rfl: 2   atorvastatin (LIPITOR) 10 MG tablet, TAKE 1 TABLET BY MOUTH EVERYDAY AT BEDTIME, Disp: 90 tablet, Rfl: 2   carvedilol (COREG) 3.125 MG tablet, TAKE 1 TABLET (3.125 MG TOTAL) BY MOUTH 2 (TWO) TIMES DAILY., Disp: 180 tablet, Rfl: 2   folic acid (FOLVITE) 1 MG tablet, TAKE 1 TABLET BY MOUTH EVERY DAY, Disp: 90 tablet, Rfl: 4   furosemide (LASIX) 40 MG tablet, TAKE 1 TABLET BY MOUTH EVERY DAY, Disp: 90 tablet, Rfl: 2   glimepiride (AMARYL) 2 MG tablet, TAKE 1 TABLET BY MOUTH EVERY DAY, Disp: 90 tablet, Rfl: 2   mirabegron ER (MYRBETRIQ) 25 MG TB24 tablet, Take 1 tablet (25 mg total) by mouth daily., Disp: 30 tablet, Rfl: 11   pantoprazole (PROTONIX) 40 MG tablet, TAKE 1 TABLET BY MOUTH TWICE A DAY, Disp: 180 tablet, Rfl: 2   sodium bicarbonate 650 MG tablet, TAKE 1 TABLET BY MOUTH TWICE A DAY, Disp: 180 tablet, Rfl: 3   vitamin B-12 (CYANOCOBALAMIN) 500 MCG tablet, Take 1 tablet (500 mcg total) by mouth daily., Disp: 90 tablet, Rfl: 1   Allergies  Allergen Reactions   Sulfa Antibiotics Rash  ROS Review of Systems  Constitutional: Negative.   HENT: Negative.    Eyes: Negative.   Respiratory: Negative.    Cardiovascular: Negative.   Gastrointestinal: Negative.   Endocrine: Negative.   Genitourinary: Negative.   Musculoskeletal: Negative.   Skin: Negative.   Allergic/Immunologic: Negative.   Neurological: Negative.   Hematological: Negative.   Psychiatric/Behavioral: Negative.    All other systems reviewed and are negative.    Objective:    Physical Exam Vitals reviewed.  Constitutional:      Appearance: Normal appearance.  HENT:     Mouth/Throat:     Mouth: Mucous membranes are moist.  Eyes:     Pupils: Pupils are equal, round, and reactive to light.  Neck:     Vascular: No carotid bruit.  Cardiovascular:     Rate and Rhythm: Normal rate and regular rhythm.     Pulses: Normal pulses.     Heart sounds: Normal  heart sounds.  Pulmonary:     Effort: Pulmonary effort is normal.     Breath sounds: Normal breath sounds.  Abdominal:     General: Bowel sounds are normal.     Palpations: Abdomen is soft. There is no hepatomegaly, splenomegaly or mass.     Tenderness: There is no abdominal tenderness.     Hernia: No hernia is present.  Musculoskeletal:     Cervical back: Neck supple.     Right lower leg: No edema.     Left lower leg: No edema.  Skin:    Findings: No rash.  Neurological:     Mental Status: He is alert and oriented to person, place, and time.     Motor: No weakness.  Psychiatric:        Mood and Affect: Mood normal.        Behavior: Behavior normal.    BP (!) 166/74   Pulse 77   Ht 5' 10"  (1.778 m)   Wt 142 lb 1.6 oz (64.5 kg)   BMI 20.39 kg/m  Wt Readings from Last 3 Encounters:  07/31/21 142 lb 1.6 oz (64.5 kg)  09/23/20 140 lb (63.5 kg)  09/21/20 143 lb (64.9 kg)     Health Maintenance Due  Topic Date Due   COVID-19 Vaccine (1) Never done   FOOT EXAM  Never done   OPHTHALMOLOGY EXAM  Never done   URINE MICROALBUMIN  Never done   TETANUS/TDAP  Never done   Zoster Vaccines- Shingrix (1 of 2) Never done   PNA vac Low Risk Adult (1 of 2 - PCV13) Never done   HEMOGLOBIN A1C  02/07/2020    There are no preventive care reminders to display for this patient.  Lab Results  Component Value Date   TSH 11.834 (H) 07/22/2017   Lab Results  Component Value Date   WBC 7.1 07/19/2021   HGB 10.7 (L) 07/19/2021   HCT 32.2 (L) 07/19/2021   MCV 81.3 07/19/2021   PLT 250 07/19/2021   Lab Results  Component Value Date   NA 135 07/19/2021   K 4.3 07/19/2021   CO2 28 07/19/2021   GLUCOSE 162 (H) 07/19/2021   BUN 33 (H) 07/19/2021   CREATININE 2.28 (H) 07/19/2021   BILITOT 0.7 07/19/2021   ALKPHOS 74 12/19/2020   AST 12 07/19/2021   ALT 9 07/19/2021   PROT 7.4 07/19/2021   ALBUMIN 3.9 12/19/2020   CALCIUM 9.6 07/19/2021   ANIONGAP 11 12/19/2020   EGFR 26 (L)  07/19/2021   No  results found for: CHOL No results found for: HDL No results found for: LDLCALC No results found for: TRIG No results found for: CHOLHDL Lab Results  Component Value Date   HGBA1C 8.0 (H) 08/07/2019      Assessment & Plan:   Problem List Items Addressed This Visit       Cardiovascular and Mediastinum   CAD (coronary artery disease) (Chronic)    Denies chest pain or shortness of breath chest is clear heart is regular there is no pedal edema      Essential hypertension    Blood pressure is stable        Digestive   GERD (gastroesophageal reflux disease)    - The patient's GERD is stable on medication.  - Instructed the patient to avoid eating spicy and acidic foods, as well as foods high in fat. - Instructed the patient to avoid eating large meals or meals 2-3 hours prior to sleeping.        Other   Leg swelling - Primary    Swelling of the leg is better.  He has changes of chronic dermatitis, there is no evidence of any active cellulitis      Relevant Orders   Ambulatory referral to Vascular Surgery    No orders of the defined types were placed in this encounter.   Follow-up: No follow-ups on file.    [name], MD

## 2021-09-02 ENCOUNTER — Other Ambulatory Visit: Payer: Self-pay | Admitting: Internal Medicine

## 2021-09-08 ENCOUNTER — Ambulatory Visit: Payer: Medicare HMO | Admitting: Physician Assistant

## 2021-09-14 ENCOUNTER — Other Ambulatory Visit: Payer: Self-pay | Admitting: Oncology

## 2021-09-14 ENCOUNTER — Other Ambulatory Visit: Payer: Self-pay | Admitting: Internal Medicine

## 2021-09-14 DIAGNOSIS — N184 Chronic kidney disease, stage 4 (severe): Secondary | ICD-10-CM

## 2021-09-14 DIAGNOSIS — Z1331 Encounter for screening for depression: Secondary | ICD-10-CM

## 2021-09-14 DIAGNOSIS — D631 Anemia in chronic kidney disease: Secondary | ICD-10-CM

## 2021-09-15 ENCOUNTER — Other Ambulatory Visit: Payer: Self-pay

## 2021-09-15 ENCOUNTER — Ambulatory Visit: Payer: Medicare HMO | Admitting: Physician Assistant

## 2021-09-15 ENCOUNTER — Encounter: Payer: Self-pay | Admitting: General Surgery

## 2021-09-15 DIAGNOSIS — R339 Retention of urine, unspecified: Secondary | ICD-10-CM

## 2021-09-15 NOTE — Progress Notes (Signed)
Cath Change/ Replacement  Patient is present today for a catheter change due to urinary retention.  43m of water was removed from the balloon, a 16FR coude foley cath was removed with out difficulty.  Patient was cleaned and prepped in a sterile fashion with betadine and 2% lidocaine jelly was instilled into the urethra. A 16 FR coude foley cath was replaced into the bladder no complications were noted Urine return was noted 280mand urine was yellow in color. The balloon was filled with 1078mf sterile water. A leg bag was attached for drainage.  Patient tolerated well.    Performed by: SamDebroah LoopA-C   Follow up: Return in about 4 weeks (around 10/13/2021) for Catheter exchange.

## 2021-09-18 ENCOUNTER — Other Ambulatory Visit: Payer: Self-pay | Admitting: *Deleted

## 2021-09-18 MED ORDER — CARVEDILOL 3.125 MG PO TABS: 3.1250 mg | ORAL_TABLET | Freq: Two times a day (BID) | ORAL | 2 refills | Status: AC

## 2021-10-13 ENCOUNTER — Other Ambulatory Visit: Payer: Self-pay

## 2021-10-13 ENCOUNTER — Ambulatory Visit: Payer: Medicare HMO | Admitting: Physician Assistant

## 2021-10-13 DIAGNOSIS — R339 Retention of urine, unspecified: Secondary | ICD-10-CM | POA: Diagnosis not present

## 2021-10-13 NOTE — Progress Notes (Signed)
Cath Change/ Replacement  Patient is present today for a catheter change due to urinary retention.  53ml of water was removed from the balloon, a 16FR coude foley cath was removed without difficulty.  Patient was cleaned and prepped in a sterile fashion with betadine and 2% lidocaine jelly was instilled into the urethra. A 16 FR coude foley cath was replaced into the bladder no complications were noted Urine return was noted 50ml and urine was yellow in color. The balloon was filled with 72ml of sterile water. A leg bag was attached for drainage.  Patient tolerated well.    Performed by: Debroah Loop, PA-C   Follow up: Return in about 4 weeks (around 11/10/2021) for Catheter exchange.

## 2021-10-23 ENCOUNTER — Other Ambulatory Visit: Payer: Self-pay | Admitting: *Deleted

## 2021-10-23 MED ORDER — TRUE METRIX BLOOD GLUCOSE TEST VI STRP: ORAL_STRIP | 12 refills | Status: AC

## 2021-10-26 ENCOUNTER — Ambulatory Visit (INDEPENDENT_AMBULATORY_CARE_PROVIDER_SITE_OTHER): Payer: Medicare HMO | Admitting: *Deleted

## 2021-10-26 DIAGNOSIS — Z0000 Encounter for general adult medical examination without abnormal findings: Secondary | ICD-10-CM | POA: Diagnosis not present

## 2021-10-26 NOTE — Progress Notes (Signed)
Subjective:   Jay Mathis is a 87 y.o. male who presents for an Initial Medicare Annual Wellness Visit.  I discussed the limitations of evaluation and management by telemedicine and the availability of in person appointments. Patient expressed understanding and agreed to proceed.   Visit performed using audio  Patient:home Provider:home  Visit done with daughter in law due to patient being hard of hearing  Review of Systems    Defer to provider  Cardiac Risk Factors include: diabetes mellitus;hypertension;male gender;advanced age (>63men, >69 women)     Objective:    There were no vitals filed for this visit. There is no height or weight on file to calculate BMI.  Advanced Directives 10/26/2021 12/23/2020 05/06/2020 08/08/2019 08/07/2019 12/31/2018 09/29/2018  Does Patient Have a Medical Advance Directive? No Yes No No No Yes Yes  Type of Advance Directive - [place];Living will  Does patient want to make changes to medical advance directive? No - Patient declined - - - - - No - Patient declined  Copy of [place] in Chart? - - - - - No - copy requested No - copy requested  Would patient like information on creating a medical advance directive? No - Patient declined No - Guardian declined - No - Patient declined No - Patient declined - -    Current Medications (verified) Outpatient Encounter Medications as of 10/26/2021  Medication Sig   amLODipine (NORVASC) 10 MG tablet TAKE 1 TABLET BY MOUTH EVERY DAY   atorvastatin (LIPITOR) 10 MG tablet TAKE 1 TABLET BY MOUTH EVERYDAY AT BEDTIME   carvedilol (COREG) 3.125 MG tablet Take 1 tablet (3.125 mg total) by mouth 2 (two) times daily.   [facility] B-12 500 MCG tablet TAKE 1 TABLET BY MOUTH DAILY   folic acid (FOLVITE) 1 MG tablet TAKE 1 TABLET BY MOUTH EVERY DAY   furosemide (LASIX) 40 MG tablet TAKE 1 TABLET BY MOUTH EVERY  DAY   glimepiride (AMARYL) 2 MG tablet TAKE 1 TABLET BY MOUTH EVERY DAY   glucose blood (TRUE METRIX BLOOD GLUCOSE TEST) test strip Use as instructed   MYRBETRIQ 25 MG TB24 tablet TAKE 1 TABLET BY MOUTH EVERY DAY   pantoprazole (PROTONIX) 40 MG tablet TAKE 1 TABLET BY MOUTH TWICE A DAY   sodium bicarbonate 650 MG tablet TAKE 1 TABLET BY MOUTH TWICE A DAY   No facility-administered encounter medications on file as of 10/26/2021.    Allergies (verified) Sulfa antibiotics   History: Past Medical History:  Diagnosis Date   CAD (coronary artery disease)    CKD (chronic kidney disease) stage 4, GFR 15-29 ml/min (HCC)    Diabetes mellitus without complication (HCC)    GERD (gastroesophageal reflux disease)    Hyperlipidemia    Hypertension    Iron deficiency anemia 07/30/2017   Nephrolithiasis    Past Surgical History:  Procedure Laterality Date   APPENDECTOMY     60 plus years ago   CORONARY ANGIOPLASTY WITH STENT PLACEMENT     CORONARY ARTERY BYPASS GRAFT     Family History  Problem Relation Age of Onset   Prostate cancer Father    Prostate cancer Brother    Social History   Socioeconomic History   Marital status: Married    Spouse name: Not on file   Number of children: Not on file   Years of education: Not on file   Highest education  level: Not on file  Occupational History   Not on file  Tobacco Use   Smoking status: Former   Smokeless tobacco: Never   Tobacco comments:    quit about 60 years ago 2019 dated  Vaping Use   Vaping Use: Never used  Substance and Sexual Activity   Alcohol use: No   Drug use: No   Sexual activity: Not Currently  Other Topics Concern   Not on file  Social History Narrative   Not on file   Social Determinants of Health   Financial Resource Strain: Low Risk    Difficulty of Paying Living Expenses: Not hard at all  Food Insecurity: No Food Insecurity   Worried About Charity fundraiser in the Last Year: Never true   [place] in the Last Year: Never true  Transportation Needs: No Transportation Needs   Lack of Transportation (Medical): No   Lack of Transportation (Non-Medical): No  Physical Activity: Insufficiently Active   Days of Exercise per Week: 2 days   Minutes of Exercise per Session: 10 min  Stress: Stress Concern Present   Feeling of Stress : To some extent  Social Connections: Socially Isolated   Frequency of Communication with Friends and Family: More than three times a week   Frequency of Social Gatherings with Friends and Family: More than three times a week   Attends Religious Services: Never   Marine scientist or Organizations: No   Attends Archivist Meetings: Never   Marital Status: Widowed    Tobacco Counseling Counseling given: Not Answered Tobacco comments: quit about 60 years ago 2019 dated   Clinical Intake:  Pre-visit preparation completed: No  Pain : No/denies pain     Nutritional Risks: None Diabetes: Yes CBG done?: No Did pt. bring in CBG monitor from home?: No  How often do you need to have someone help you when you read instructions, pamphlets, or other written materials from your doctor or pharmacy?: 4 - Often What is the last grade level you completed in school?: high school  Diabetic?Yes   Interpreter Needed?: No  Information entered by :: Lacretia Nicks, [place]   Activities of Daily Living In your present state of health, do you have any difficulty performing the following activities: 10/26/2021  Hearing? Y  Vision? N  Difficulty concentrating or making decisions? N  Walking or climbing stairs? Y  Dressing or bathing? Y  Doing errands, shopping? Y  Preparing Food and eating ? Y  Using the Toilet? N  In the past six months, have you accidently leaked urine? N  Do you have problems with loss of bowel control? N  Managing your Medications? Y  Managing your Finances? Y  Housekeeping or managing your Housekeeping? Y  Some recent  data might be hidden    Patient Care Team: [name], MD as PCP - General (Internal Medicine) Earlie Server, MD as Consulting Physician (Oncology)  Indicate any recent Medical Services you may have received from other than [facility] providers in the past year (date may be approximate).     Assessment:   This is a routine wellness examination for Jay Mathis.  Hearing/Vision screen No results found.  Dietary issues and exercise activities discussed: Current Exercise Habits: Home exercise routine, Type of exercise: walking, Time (Minutes): 20, Frequency (Times/Week): 2, Weekly Exercise (Minutes/Week): 40, Intensity: Mild   Goals Addressed   None   Depression Screen [facility] 2/9 Scores 10/26/2021 10/26/2021 07/19/2021  PHQ -  2 Score 0 0 0    Fall Risk Fall Risk  10/26/2021 07/19/2021 02/03/2020  Falls in the past year? 1 1 0  Number falls in past yr: 1 0 0  Injury with Fall? 0 1 0  Risk for fall due to : History of fall(s) History of fall(s) -  Follow up Falls evaluation completed Falls evaluation completed -    FALL RISK PREVENTION PERTAINING TO THE HOME:  Any stairs in or around the home? No  If so, are there any without handrails? No  Home free of loose throw rugs in walkways, pet beds, electrical cords, etc? Yes  Adequate lighting in your home to reduce risk of falls? Yes   ASSISTIVE DEVICES UTILIZED TO PREVENT FALLS:  Life alert? No  Use of a cane, walker or w/c? Yes  Grab bars in the bathroom? Yes  Shower chair or bench in shower? Yes  Elevated toilet seat or a handicapped toilet? No   TIMED UP AND GO:  Was the test performed? No .  Length of time to ambulate- NA  Gait slow and steady with assistive device  Cognitive Function: MMSE - Mini Mental State Exam 10/26/2021  Not completed: Unable to complete     6CIT Screen 10/26/2021  What Year? 0 points  What month? 0 points  What time? 3 points  Count back from 20 4 points  Months in reverse 4 points  Repeat phrase 10  points  Total Score 21    Immunizations  There is no immunization history on file for this patient.  TDAP status: Due, Education has been provided regarding the importance of this vaccine. Advised may receive this vaccine at local pharmacy or Health Dept. Aware to provide a copy of the vaccination record if obtained from local pharmacy or Health Dept. Verbalized acceptance and understanding.  Flu Vaccine status: Declined, Education has been provided regarding the importance of this vaccine but patient still declined. Advised may receive this vaccine at local pharmacy or Health Dept. Aware to provide a copy of the vaccination record if obtained from local pharmacy or Health Dept. Verbalized acceptance and understanding.  Pneumococcal vaccine status: Declined,  Education has been provided regarding the importance of this vaccine but patient still declined. Advised may receive this vaccine at local pharmacy or Health Dept. Aware to provide a copy of the vaccination record if obtained from local pharmacy or Health Dept. Verbalized acceptance and understanding.   Covid-19 vaccine status: Declined, Education has been provided regarding the importance of this vaccine but patient still declined. Advised may receive this vaccine at local pharmacy or Health Dept.or vaccine clinic. Aware to provide a copy of the vaccination record if obtained from local pharmacy or Health Dept. Verbalized acceptance and understanding.  Qualifies for Shingles Vaccine? No   Zostavax completed No   Shingrix Completed?: No.    Education has been provided regarding the importance of this vaccine. Patient has been advised to call insurance company to determine out of pocket expense if they have not yet received this vaccine. Advised may also receive vaccine at local pharmacy or Health Dept. Verbalized acceptance and understanding.  Screening Tests Health Maintenance  Topic Date Due   URINE MICROALBUMIN  Never done    HEMOGLOBIN A1C  02/07/2020   COVID-19 Vaccine (1) 11/11/2021 (Originally 02/27/1925)   Zoster Vaccines- Shingrix (1 of 2) 01/26/2022 (Originally 08/29/1943)   INFLUENZA VACCINE  08/05/2022 (Originally 07/10/2021)   Pneumonia Vaccine 9+ Years old (1 - PCV) 10/26/2022 (Originally  08/29/1930)   FOOT EXAM  10/26/2022 (Originally 08/29/1934)   TETANUS/TDAP  10/26/2022 (Originally 08/29/1943)   OPHTHALMOLOGY EXAM  06/09/2022   HPV VACCINES  Aged Out    Health Maintenance  Health Maintenance Due  Topic Date Due   URINE MICROALBUMIN  Never done   HEMOGLOBIN A1C  02/07/2020    Colorectal cancer screening: No longer required.   Lung Cancer Screening: (Low Dose CT Chest recommended if Age 18-80 years, 30 pack-year currently smoking OR have quit w/in 15years.) does not qualify.   Lung Cancer Screening Referral: NA   Additional Screening:  Hepatitis C Screening: does not qualify; Completed No  Vision Screening: Recommended annual ophthalmology exams for early detection of glaucoma and other disorders of the eye. Is the patient up to date with their annual eye exam?  Yes  Who is the provider or what is the name of the office in which the patient attends annual eye exams? [facility]  If pt is not established with a provider, would they like to be referred to a provider to establish care? No .   Dental Screening: Recommended annual dental exams for proper oral hygiene  Community Resource Referral / Chronic Care Management: CRR required this visit?  No   CCM required this visit?  No      Plan:     I have personally reviewed and noted the following in the patient's chart:   Medical and social history Use of alcohol, tobacco or illicit drugs  Current medications and supplements including opioid prescriptions. Patient is not currently taking opioid prescriptions. Functional ability and status Nutritional status Physical activity Advanced directives List of other  physicians Hospitalizations, surgeries, and ER visits in previous 12 months Vitals Screenings to include cognitive, depression, and falls Referrals and appointments  In addition, I have reviewed and discussed with patient certain preventive protocols, quality metrics, and best practice recommendations. A written personalized care plan for preventive services as well as general preventive health recommendations were provided to patient.     Lacretia Nicks, [place]   10/26/2021   Nurse Notes:  Mr. Pitta , Thank you for taking time to come for your Medicare Wellness Visit. I appreciate your ongoing commitment to your health goals. Please review the following plan we discussed and let me know if I can assist you in the future.   These are the goals we discussed:  Goals      Have 3 meals a day        This is a list of the screening recommended for you and due dates:  Health Maintenance  Topic Date Due   Urine Protein Check  Never done   Hemoglobin A1C  02/07/2020   COVID-19 Vaccine (1) 11/11/2021*   Zoster (Shingles) Vaccine (1 of 2) 01/26/2022*   Flu Shot  08/05/2022*   Pneumonia Vaccine (1 - PCV) 10/26/2022*   Complete foot exam   10/26/2022*   Tetanus Vaccine  10/26/2022*   Eye exam for diabetics  06/09/2022   HPV Vaccine  Aged Out  *Topic was postponed. The date shown is not the original due date.     Time spent 25 min

## 2021-10-28 NOTE — Progress Notes (Signed)
I have reviewed this visit and agree with the documentation.   

## 2021-11-10 ENCOUNTER — Ambulatory Visit: Payer: Medicare HMO | Admitting: Physician Assistant

## 2021-11-10 ENCOUNTER — Other Ambulatory Visit: Payer: Self-pay

## 2021-11-10 DIAGNOSIS — Z466 Encounter for fitting and adjustment of urinary device: Secondary | ICD-10-CM

## 2021-11-10 DIAGNOSIS — T83518A Infection and inflammatory reaction due to other urinary catheter, initial encounter: Secondary | ICD-10-CM

## 2021-11-10 DIAGNOSIS — R339 Retention of urine, unspecified: Secondary | ICD-10-CM | POA: Diagnosis not present

## 2021-11-10 DIAGNOSIS — N390 Urinary tract infection, site not specified: Secondary | ICD-10-CM

## 2021-11-10 NOTE — Progress Notes (Signed)
Cath Change/ Replacement  Patient is present today for a catheter change due to urinary retention.  92ml of water was removed from the balloon, a 16FR coude foley cath was removed without difficulty.  Patient was cleaned and prepped in a sterile fashion with betadine and 2% lidocaine jelly was instilled into the urethra. A 16 FR coude foley cath was replaced into the bladder no complications were noted Urine return was noted 78ml and urine was clear in color. The balloon was filled with 90ml of sterile water. A leg bag was attached for drainage.  A night bag was also given to the patient. Patient tolerated well.    Performed by: Debroah Loop, PA-C   Additional notes: Patient and son report his drainage bags have been turning purple. Explained this is a benign finding representing a chemical reaction in the bag and does not require treatment. They expressed understanding.  Follow up: Return in about 4 weeks (around 12/08/2021) for Catheter exchange.

## 2021-11-20 DIAGNOSIS — E785 Hyperlipidemia, unspecified: Secondary | ICD-10-CM | POA: Diagnosis not present

## 2021-11-20 DIAGNOSIS — R339 Retention of urine, unspecified: Secondary | ICD-10-CM | POA: Diagnosis not present

## 2021-11-20 DIAGNOSIS — I10 Essential (primary) hypertension: Secondary | ICD-10-CM | POA: Diagnosis not present

## 2021-11-20 DIAGNOSIS — D649 Anemia, unspecified: Secondary | ICD-10-CM | POA: Diagnosis not present

## 2021-11-20 DIAGNOSIS — E119 Type 2 diabetes mellitus without complications: Secondary | ICD-10-CM | POA: Diagnosis not present

## 2021-11-20 DIAGNOSIS — Z978 Presence of other specified devices: Secondary | ICD-10-CM | POA: Diagnosis not present

## 2021-11-20 DIAGNOSIS — K219 Gastro-esophageal reflux disease without esophagitis: Secondary | ICD-10-CM | POA: Diagnosis not present

## 2021-12-08 ENCOUNTER — Encounter: Payer: Self-pay | Admitting: Urology

## 2021-12-08 ENCOUNTER — Ambulatory Visit: Payer: Medicare HMO | Admitting: Physician Assistant

## 2021-12-08 ENCOUNTER — Other Ambulatory Visit: Payer: Self-pay

## 2021-12-08 DIAGNOSIS — R339 Retention of urine, unspecified: Secondary | ICD-10-CM

## 2021-12-08 DIAGNOSIS — Z466 Encounter for fitting and adjustment of urinary device: Secondary | ICD-10-CM

## 2021-12-08 NOTE — Progress Notes (Signed)
Cath Change/ Replacement  Patient is present today for a catheter change due to urinary retention.  35ml of water was removed from the balloon, a 16FR coude foley cath was removed without difficulty.  Patient was cleaned and prepped in a sterile fashion with betadine and 2% lidocaine jelly was instilled into the urethra. A 16 FR coude foley cath was replaced into the bladder no complications were noted Urine return was noted 9ml and urine was yellow in color. The balloon was filled with 57ml of sterile water. A leg bag was attached for drainage.  A night bag was also given to the patient. Patient tolerated well.  Performed by: Debroah Loop, PA-C   Follow up: Return in about 4 weeks (around 01/05/2022) for Catheter exchange.

## 2021-12-12 ENCOUNTER — Other Ambulatory Visit: Payer: Self-pay | Admitting: *Deleted

## 2021-12-12 MED ORDER — PANTOPRAZOLE SODIUM 40 MG PO TBEC: 40.0000 mg | DELAYED_RELEASE_TABLET | Freq: Two times a day (BID) | ORAL | 2 refills | Status: AC

## 2021-12-24 ENCOUNTER — Emergency Department: Payer: Medicare HMO

## 2021-12-24 ENCOUNTER — Encounter: Payer: Self-pay | Admitting: Emergency Medicine

## 2021-12-24 ENCOUNTER — Other Ambulatory Visit: Payer: Self-pay

## 2021-12-24 ENCOUNTER — Inpatient Hospital Stay
Admission: EM | Admit: 2021-12-24 | Discharge: 2022-01-05 | DRG: 871 | Disposition: A | Discharge status: Hospice | Payer: Medicare HMO | Attending: Internal Medicine | Admitting: Internal Medicine

## 2021-12-24 DIAGNOSIS — Z955 Presence of coronary angioplasty implant and graft: Secondary | ICD-10-CM

## 2021-12-24 DIAGNOSIS — Y9301 Activity, walking, marching and hiking: Secondary | ICD-10-CM | POA: Diagnosis present

## 2021-12-24 DIAGNOSIS — S2232XA Fracture of one rib, left side, initial encounter for closed fracture: Secondary | ICD-10-CM | POA: Diagnosis present

## 2021-12-24 DIAGNOSIS — R0689 Other abnormalities of breathing: Secondary | ICD-10-CM | POA: Diagnosis not present

## 2021-12-24 DIAGNOSIS — D62 Acute posthemorrhagic anemia: Secondary | ICD-10-CM | POA: Diagnosis not present

## 2021-12-24 DIAGNOSIS — H9190 Unspecified hearing loss, unspecified ear: Secondary | ICD-10-CM | POA: Diagnosis present

## 2021-12-24 DIAGNOSIS — I130 Hypertensive heart and chronic kidney disease with heart failure and stage 1 through stage 4 chronic kidney disease, or unspecified chronic kidney disease: Secondary | ICD-10-CM | POA: Diagnosis present

## 2021-12-24 DIAGNOSIS — Z23 Encounter for immunization: Secondary | ICD-10-CM | POA: Diagnosis present

## 2021-12-24 DIAGNOSIS — R748 Abnormal levels of other serum enzymes: Secondary | ICD-10-CM | POA: Diagnosis present

## 2021-12-24 DIAGNOSIS — R739 Hyperglycemia, unspecified: Secondary | ICD-10-CM | POA: Diagnosis not present

## 2021-12-24 DIAGNOSIS — Y92009 Unspecified place in unspecified non-institutional (private) residence as the place of occurrence of the external cause: Secondary | ICD-10-CM

## 2021-12-24 DIAGNOSIS — D509 Iron deficiency anemia, unspecified: Secondary | ICD-10-CM | POA: Diagnosis present

## 2021-12-24 DIAGNOSIS — E1122 Type 2 diabetes mellitus with diabetic chronic kidney disease: Secondary | ICD-10-CM | POA: Diagnosis present

## 2021-12-24 DIAGNOSIS — N1330 Unspecified hydronephrosis: Secondary | ICD-10-CM | POA: Diagnosis present

## 2021-12-24 DIAGNOSIS — U071 COVID-19: Secondary | ICD-10-CM | POA: Diagnosis present

## 2021-12-24 DIAGNOSIS — M6282 Rhabdomyolysis: Secondary | ICD-10-CM | POA: Diagnosis present

## 2021-12-24 DIAGNOSIS — R109 Unspecified abdominal pain: Secondary | ICD-10-CM | POA: Diagnosis not present

## 2021-12-24 DIAGNOSIS — N390 Urinary tract infection, site not specified: Secondary | ICD-10-CM | POA: Diagnosis not present

## 2021-12-24 DIAGNOSIS — I2510 Atherosclerotic heart disease of native coronary artery without angina pectoris: Secondary | ICD-10-CM | POA: Diagnosis present

## 2021-12-24 DIAGNOSIS — R4182 Altered mental status, unspecified: Secondary | ICD-10-CM | POA: Diagnosis present

## 2021-12-24 DIAGNOSIS — J9811 Atelectasis: Secondary | ICD-10-CM | POA: Diagnosis not present

## 2021-12-24 DIAGNOSIS — D631 Anemia in chronic kidney disease: Secondary | ICD-10-CM | POA: Diagnosis present

## 2021-12-24 DIAGNOSIS — Y92019 Unspecified place in single-family (private) house as the place of occurrence of the external cause: Secondary | ICD-10-CM

## 2021-12-24 DIAGNOSIS — Z7984 Long term (current) use of oral hypoglycemic drugs: Secondary | ICD-10-CM

## 2021-12-24 DIAGNOSIS — E871 Hypo-osmolality and hyponatremia: Secondary | ICD-10-CM | POA: Diagnosis present

## 2021-12-24 DIAGNOSIS — R1319 Other dysphagia: Secondary | ICD-10-CM | POA: Diagnosis present

## 2021-12-24 DIAGNOSIS — R296 Repeated falls: Secondary | ICD-10-CM | POA: Diagnosis not present

## 2021-12-24 DIAGNOSIS — G9341 Metabolic encephalopathy: Secondary | ICD-10-CM | POA: Diagnosis present

## 2021-12-24 DIAGNOSIS — Z978 Presence of other specified devices: Secondary | ICD-10-CM

## 2021-12-24 DIAGNOSIS — I10 Essential (primary) hypertension: Secondary | ICD-10-CM | POA: Diagnosis not present

## 2021-12-24 DIAGNOSIS — Z28310 Unvaccinated for covid-19: Secondary | ICD-10-CM

## 2021-12-24 DIAGNOSIS — R0902 Hypoxemia: Secondary | ICD-10-CM | POA: Diagnosis not present

## 2021-12-24 DIAGNOSIS — Z87891 Personal history of nicotine dependence: Secondary | ICD-10-CM

## 2021-12-24 DIAGNOSIS — Z66 Do not resuscitate: Secondary | ICD-10-CM | POA: Diagnosis present

## 2021-12-24 DIAGNOSIS — I517 Cardiomegaly: Secondary | ICD-10-CM | POA: Diagnosis not present

## 2021-12-24 DIAGNOSIS — W19XXXA Unspecified fall, initial encounter: Secondary | ICD-10-CM | POA: Diagnosis present

## 2021-12-24 DIAGNOSIS — I4891 Unspecified atrial fibrillation: Secondary | ICD-10-CM | POA: Diagnosis not present

## 2021-12-24 DIAGNOSIS — N184 Chronic kidney disease, stage 4 (severe): Secondary | ICD-10-CM | POA: Diagnosis present

## 2021-12-24 DIAGNOSIS — Z8042 Family history of malignant neoplasm of prostate: Secondary | ICD-10-CM

## 2021-12-24 DIAGNOSIS — R443 Hallucinations, unspecified: Secondary | ICD-10-CM | POA: Diagnosis not present

## 2021-12-24 DIAGNOSIS — K5730 Diverticulosis of large intestine without perforation or abscess without bleeding: Secondary | ICD-10-CM | POA: Diagnosis not present

## 2021-12-24 DIAGNOSIS — K219 Gastro-esophageal reflux disease without esophagitis: Secondary | ICD-10-CM | POA: Diagnosis present

## 2021-12-24 DIAGNOSIS — K449 Diaphragmatic hernia without obstruction or gangrene: Secondary | ICD-10-CM | POA: Diagnosis not present

## 2021-12-24 DIAGNOSIS — E876 Hypokalemia: Secondary | ICD-10-CM | POA: Diagnosis not present

## 2021-12-24 DIAGNOSIS — S2239XA Fracture of one rib, unspecified side, initial encounter for closed fracture: Secondary | ICD-10-CM | POA: Diagnosis present

## 2021-12-24 DIAGNOSIS — I5030 Unspecified diastolic (congestive) heart failure: Secondary | ICD-10-CM | POA: Diagnosis present

## 2021-12-24 DIAGNOSIS — Z515 Encounter for palliative care: Secondary | ICD-10-CM

## 2021-12-24 DIAGNOSIS — E119 Type 2 diabetes mellitus without complications: Secondary | ICD-10-CM

## 2021-12-24 DIAGNOSIS — N400 Enlarged prostate without lower urinary tract symptoms: Secondary | ICD-10-CM | POA: Diagnosis present

## 2021-12-24 DIAGNOSIS — N401 Enlarged prostate with lower urinary tract symptoms: Secondary | ICD-10-CM | POA: Diagnosis present

## 2021-12-24 DIAGNOSIS — Z960 Presence of urogenital implants: Secondary | ICD-10-CM | POA: Diagnosis present

## 2021-12-24 DIAGNOSIS — A419 Sepsis, unspecified organism: Secondary | ICD-10-CM | POA: Diagnosis not present

## 2021-12-24 DIAGNOSIS — E872 Acidosis, unspecified: Secondary | ICD-10-CM | POA: Diagnosis present

## 2021-12-24 DIAGNOSIS — Z951 Presence of aortocoronary bypass graft: Secondary | ICD-10-CM

## 2021-12-24 DIAGNOSIS — J690 Pneumonitis due to inhalation of food and vomit: Secondary | ICD-10-CM | POA: Diagnosis not present

## 2021-12-24 DIAGNOSIS — S0003XA Contusion of scalp, initial encounter: Secondary | ICD-10-CM | POA: Diagnosis present

## 2021-12-24 DIAGNOSIS — G9349 Other encephalopathy: Secondary | ICD-10-CM | POA: Diagnosis present

## 2021-12-24 DIAGNOSIS — Z743 Need for continuous supervision: Secondary | ICD-10-CM | POA: Diagnosis not present

## 2021-12-24 DIAGNOSIS — J9601 Acute respiratory failure with hypoxia: Secondary | ICD-10-CM | POA: Diagnosis not present

## 2021-12-24 DIAGNOSIS — E1165 Type 2 diabetes mellitus with hyperglycemia: Secondary | ICD-10-CM | POA: Diagnosis present

## 2021-12-24 DIAGNOSIS — R531 Weakness: Secondary | ICD-10-CM | POA: Diagnosis not present

## 2021-12-24 DIAGNOSIS — A4189 Other specified sepsis: Secondary | ICD-10-CM | POA: Diagnosis present

## 2021-12-24 DIAGNOSIS — E785 Hyperlipidemia, unspecified: Secondary | ICD-10-CM | POA: Diagnosis present

## 2021-12-24 DIAGNOSIS — Z79899 Other long term (current) drug therapy: Secondary | ICD-10-CM

## 2021-12-24 DIAGNOSIS — I5032 Chronic diastolic (congestive) heart failure: Secondary | ICD-10-CM | POA: Diagnosis present

## 2021-12-24 DIAGNOSIS — S301XXA Contusion of abdominal wall, initial encounter: Secondary | ICD-10-CM | POA: Diagnosis present

## 2021-12-24 DIAGNOSIS — Z043 Encounter for examination and observation following other accident: Secondary | ICD-10-CM | POA: Diagnosis not present

## 2021-12-24 DIAGNOSIS — R8281 Pyuria: Secondary | ICD-10-CM | POA: Diagnosis present

## 2021-12-24 DIAGNOSIS — Z9049 Acquired absence of other specified parts of digestive tract: Secondary | ICD-10-CM

## 2021-12-24 DIAGNOSIS — S199XXA Unspecified injury of neck, initial encounter: Secondary | ICD-10-CM | POA: Diagnosis not present

## 2021-12-24 DIAGNOSIS — K4090 Unilateral inguinal hernia, without obstruction or gangrene, not specified as recurrent: Secondary | ICD-10-CM | POA: Diagnosis present

## 2021-12-24 DIAGNOSIS — Z87442 Personal history of urinary calculi: Secondary | ICD-10-CM

## 2021-12-24 DIAGNOSIS — R5381 Other malaise: Secondary | ICD-10-CM | POA: Diagnosis not present

## 2021-12-24 DIAGNOSIS — N2581 Secondary hyperparathyroidism of renal origin: Secondary | ICD-10-CM | POA: Diagnosis present

## 2021-12-24 DIAGNOSIS — R7401 Elevation of levels of liver transaminase levels: Secondary | ICD-10-CM | POA: Diagnosis present

## 2021-12-24 DIAGNOSIS — R338 Other retention of urine: Secondary | ICD-10-CM | POA: Diagnosis present

## 2021-12-24 LAB — URINALYSIS, COMPLETE (UACMP) WITH MICROSCOPIC
Bilirubin Urine: NEGATIVE
Glucose, UA: 250 mg/dL — AB
Ketones, ur: NEGATIVE mg/dL
Nitrite: NEGATIVE
Protein, ur: >300 mg/dL — AB
RBC / HPF: >50 RBC/hpf — ABNORMAL HIGH (ref 0–5)
Specific Gravity, Urine: 1.02 (ref 1.005–1.030)
WBC, UA: >50 WBC/hpf — ABNORMAL HIGH (ref 0–5)
pH: 7 (ref 5.0–8.0)

## 2021-12-24 LAB — CBC WITH DIFFERENTIAL/PLATELET
Abs Immature Granulocytes: 0.02 10*3/uL (ref 0.00–0.07)
Basophils Absolute: 0 10*3/uL (ref 0.0–0.1)
Basophils Relative: 0 %
Eosinophils Absolute: 0.1 10*3/uL (ref 0.0–0.5)
Eosinophils Relative: 1 %
HCT: 29.9 % — ABNORMAL LOW (ref 39.0–52.0)
Hemoglobin: 9.9 g/dL — ABNORMAL LOW (ref 13.0–17.0)
Immature Granulocytes: 0 %
Lymphocytes Relative: 13 %
Lymphs Abs: 0.9 10*3/uL (ref 0.7–4.0)
MCH: 27.1 pg (ref 26.0–34.0)
MCHC: 33.1 g/dL (ref 30.0–36.0)
MCV: 81.9 fL (ref 80.0–100.0)
Monocytes Absolute: 1.4 10*3/uL — ABNORMAL HIGH (ref 0.1–1.0)
Monocytes Relative: 21 %
Neutro Abs: 4.2 10*3/uL (ref 1.7–7.7)
Neutrophils Relative %: 65 %
Platelets: 178 10*3/uL (ref 150–400)
RBC: 3.65 MIL/uL — ABNORMAL LOW (ref 4.22–5.81)
RDW: 13.7 % (ref 11.5–15.5)
WBC: 6.5 10*3/uL (ref 4.0–10.5)
nRBC: 0 % (ref 0.0–0.2)

## 2021-12-24 LAB — PROTIME-INR
INR: 1 (ref 0.8–1.2)
Prothrombin Time: 13.5 seconds (ref 11.4–15.2)

## 2021-12-24 LAB — COMPREHENSIVE METABOLIC PANEL
ALT: 38 U/L (ref 0–44)
AST: 62 U/L — ABNORMAL HIGH (ref 15–41)
Albumin: 3.7 g/dL (ref 3.5–5.0)
Alkaline Phosphatase: 97 U/L (ref 38–126)
Anion gap: 12 (ref 5–15)
BUN: 42 mg/dL — ABNORMAL HIGH (ref 8–23)
CO2: 25 mmol/L (ref 22–32)
Calcium: 9 mg/dL (ref 8.9–10.3)
Chloride: 96 mmol/L — ABNORMAL LOW (ref 98–111)
Creatinine, Ser: 2.62 mg/dL — ABNORMAL HIGH (ref 0.61–1.24)
GFR, Estimated: 22 mL/min — ABNORMAL LOW (ref >60)
Glucose, Bld: 360 mg/dL — ABNORMAL HIGH (ref 70–99)
Potassium: 3.9 mmol/L (ref 3.5–5.1)
Sodium: 133 mmol/L — ABNORMAL LOW (ref 135–145)
Total Bilirubin: 0.9 mg/dL (ref 0.3–1.2)
Total Protein: 7.6 g/dL (ref 6.5–8.1)

## 2021-12-24 LAB — URINALYSIS, ROUTINE W REFLEX MICROSCOPIC
Bilirubin Urine: NEGATIVE
Glucose, UA: 250 mg/dL — AB
Ketones, ur: NEGATIVE mg/dL
Nitrite: NEGATIVE
Protein, ur: 100 mg/dL — AB
Specific Gravity, Urine: 1.02 (ref 1.005–1.030)
pH: 7 (ref 5.0–8.0)

## 2021-12-24 LAB — APTT: aPTT: 29 seconds (ref 24–36)

## 2021-12-24 LAB — CBG MONITORING, ED: Glucose-Capillary: 234 mg/dL — ABNORMAL HIGH (ref 70–99)

## 2021-12-24 LAB — LACTIC ACID, PLASMA
Lactic Acid, Venous: 2.8 mmol/L (ref 0.5–1.9)
Lactic Acid, Venous: 1.4 mmol/L (ref 0.5–1.9)

## 2021-12-24 LAB — RESP PANEL BY RT-PCR (FLU A&B, COVID) ARPGX2
Influenza A by PCR: NEGATIVE
Influenza B by PCR: NEGATIVE
SARS Coronavirus 2 by RT PCR: POSITIVE — AB

## 2021-12-24 LAB — PROCALCITONIN: Procalcitonin: 0.1 ng/mL

## 2021-12-24 LAB — CK: Total CK: 602 U/L — ABNORMAL HIGH (ref 49–397)

## 2021-12-24 MED ORDER — ONDANSETRON HCL 4 MG/2ML IJ SOLN: 4.0000 mg | Freq: Four times a day (QID) | INTRAMUSCULAR | Status: DC | PRN

## 2021-12-24 MED ORDER — ACETAMINOPHEN 325 MG PO TABS
650.0000 mg | ORAL_TABLET | Freq: Four times a day (QID) | ORAL | Status: AC | PRN
  Administered 2021-12-26 – 2021-12-27 (×2): 650 mg via ORAL
  Filled 2021-12-24 (×2): qty 2

## 2021-12-24 MED ORDER — SODIUM CHLORIDE 0.9 % IV SOLN: INTRAVENOUS | Status: AC

## 2021-12-24 MED ORDER — GUAIFENESIN-DM 100-10 MG/5ML PO SYRP
10.0000 mL | ORAL_SOLUTION | ORAL | Status: DC | PRN
  Administered 2021-12-26 – 2021-12-31 (×8): 10 mL via ORAL
  Filled 2021-12-24 (×8): qty 10

## 2021-12-24 MED ORDER — TETANUS-DIPHTH-ACELL PERTUSSIS 5-2.5-18.5 LF-MCG/0.5 IM SUSY
0.5000 mL | PREFILLED_SYRINGE | Freq: Once | INTRAMUSCULAR | Status: AC
  Administered 2021-12-24: 0.5 mL via INTRAMUSCULAR
  Filled 2021-12-24: qty 0.5

## 2021-12-24 MED ORDER — ONDANSETRON HCL 4 MG PO TABS: 4.0000 mg | ORAL_TABLET | Freq: Four times a day (QID) | ORAL | Status: DC | PRN

## 2021-12-24 MED ORDER — ALBUTEROL SULFATE HFA 108 (90 BASE) MCG/ACT IN AERS
2.0000 | INHALATION_SPRAY | RESPIRATORY_TRACT | Status: DC | PRN
  Administered 2021-12-26: 2 via RESPIRATORY_TRACT
  Filled 2021-12-24 (×2): qty 6.7

## 2021-12-24 MED ORDER — ACETAMINOPHEN 650 MG RE SUPP: 650.0000 mg | Freq: Four times a day (QID) | RECTAL | Status: AC | PRN

## 2021-12-24 MED ORDER — SODIUM CHLORIDE 0.9 % IV BOLUS
500.0000 mL | Freq: Once | INTRAVENOUS | Status: AC
Start: 2021-12-24 — End: 2021-12-24
  Administered 2021-12-24: 500 mL via INTRAVENOUS

## 2021-12-24 MED ORDER — HEPARIN SODIUM (PORCINE) 5000 UNIT/ML IJ SOLN
5000.0000 [IU] | Freq: Three times a day (TID) | INTRAMUSCULAR | Status: DC
  Administered 2021-12-24 – 2022-01-01 (×23): 5000 [IU] via SUBCUTANEOUS
  Filled 2021-12-24 (×24): qty 1

## 2021-12-24 MED ORDER — INSULIN ASPART 100 UNIT/ML IJ SOLN
0.0000 [IU] | Freq: Every day | INTRAMUSCULAR | Status: DC
  Administered 2021-12-24 – 2021-12-27 (×3): 2 [IU] via SUBCUTANEOUS
  Administered 2021-12-28: 3 [IU] via SUBCUTANEOUS
  Administered 2021-12-29: 2 [IU] via SUBCUTANEOUS
  Filled 2021-12-24 (×5): qty 1

## 2021-12-24 MED ORDER — HYDRALAZINE HCL 20 MG/ML IJ SOLN: 5.0000 mg | INTRAMUSCULAR | Status: DC | PRN

## 2021-12-24 MED ORDER — SODIUM CHLORIDE 0.9 % IV SOLN
1.0000 g | Freq: Once | INTRAVENOUS | Status: AC
  Administered 2021-12-24: 1 g via INTRAVENOUS
  Filled 2021-12-24: qty 10

## 2021-12-24 MED ORDER — LACTATED RINGERS IV BOLUS
1000.0000 mL | Freq: Once | INTRAVENOUS | Status: AC
  Administered 2021-12-24: 1000 mL via INTRAVENOUS

## 2021-12-24 MED ORDER — INSULIN ASPART 100 UNIT/ML IJ SOLN
0.0000 [IU] | Freq: Three times a day (TID) | INTRAMUSCULAR | Status: DC
  Administered 2021-12-25: 17:00:00 1 [IU] via SUBCUTANEOUS
  Administered 2021-12-25: 10:00:00 2 [IU] via SUBCUTANEOUS
  Administered 2021-12-25 – 2021-12-26 (×2): 3 [IU] via SUBCUTANEOUS
  Administered 2021-12-26: 2 [IU] via SUBCUTANEOUS
  Administered 2021-12-26: 7 [IU] via SUBCUTANEOUS
  Administered 2021-12-27: 3 [IU] via SUBCUTANEOUS
  Administered 2021-12-27: 9 [IU] via SUBCUTANEOUS
  Administered 2021-12-27: 5 [IU] via SUBCUTANEOUS
  Administered 2021-12-28: 7 [IU] via SUBCUTANEOUS
  Administered 2021-12-28: 5 [IU] via SUBCUTANEOUS
  Administered 2021-12-28: 2 [IU] via SUBCUTANEOUS
  Administered 2021-12-29: 3 [IU] via SUBCUTANEOUS
  Filled 2021-12-24 (×12): qty 1

## 2021-12-24 MED ORDER — HYDROCOD POLI-CHLORPHE POLI ER 10-8 MG/5ML PO SUER
5.0000 mL | Freq: Two times a day (BID) | ORAL | Status: DC | PRN
  Administered 2021-12-26 – 2022-01-04 (×6): 5 mL via ORAL
  Filled 2021-12-24 (×6): qty 5

## 2021-12-24 MED ORDER — SODIUM CHLORIDE 0.9 % IV SOLN
2.0000 g | INTRAVENOUS | Status: AC
  Administered 2021-12-25 – 2021-12-26 (×2): 2 g via INTRAVENOUS
  Filled 2021-12-24: qty 2
  Filled 2021-12-24: qty 20
  Filled 2021-12-24: qty 2

## 2021-12-24 MED ORDER — PANTOPRAZOLE SODIUM 40 MG PO TBEC
40.0000 mg | DELAYED_RELEASE_TABLET | Freq: Two times a day (BID) | ORAL | Status: DC
  Administered 2021-12-25 – 2022-01-05 (×22): 40 mg via ORAL
  Filled 2021-12-24 (×22): qty 1

## 2021-12-24 MED ORDER — ATORVASTATIN CALCIUM 20 MG PO TABS: 10.0000 mg | ORAL_TABLET | Freq: Every day | ORAL | Status: DC

## 2021-12-24 MED ORDER — ACETAMINOPHEN 500 MG PO TABS
1000.0000 mg | ORAL_TABLET | Freq: Once | ORAL | Status: AC
  Administered 2021-12-24: 1000 mg via ORAL
  Filled 2021-12-24: qty 2

## 2021-12-24 MED ORDER — FOLIC ACID 1 MG PO TABS
1.0000 mg | ORAL_TABLET | Freq: Every day | ORAL | Status: DC
  Administered 2021-12-25 – 2022-01-05 (×12): 1 mg via ORAL
  Filled 2021-12-24 (×12): qty 1

## 2021-12-24 NOTE — Assessment & Plan Note (Signed)
-   Hemoglobin on presentation is 9.9, in 2022, patient ranged from 10.0-10.7 - No transfusion indicated at this time, CBC in the a.m.

## 2021-12-24 NOTE — Assessment & Plan Note (Signed)
-  With history of urinary retention - Foley replaced in the emergency department

## 2021-12-24 NOTE — ED Provider Notes (Signed)
[facility] Provider Note    Event Date/Time   First MD Initiated Contact with Patient 12/24/21 2024     (approximate)   History   Weakness   HPI  Jay Mathis is a 88 y.o. male with CKD, hypertension, urinary retention with chronic Foley who comes in for weakness.  Patient reportedly had 2 falls today.  Concern of knee pain.  According to family patient lives by himself but family frequently comes in.  They stated that they found him down on the ground 2 times.  Patient is hard of hearing but he is able to lift up his bilateral legs.  He was noted to be febrile and tachycardic with EMS.  Family does report that he has a another family member who has been sick.  Patient has a chronic indwelling Foley.   I reviewed a neurology note from 12/08/2021 where patient came in for a Foley catheter replacement.      Physical Exam   Triage Vital Signs: ED Triage Vitals  Enc Vitals Group     BP 12/24/21 1823 (!) 156/70     Pulse Rate 12/24/21 1823 (!) 110     Resp 12/24/21 1823 20     Temp 12/24/21 1823 100.2 F (37.9 C)     Temp Source 12/24/21 1823 Oral     SpO2 12/24/21 1823 96 %     Weight 12/24/21 1826 185 lb (83.9 kg)     Height 12/24/21 1826 5\' 10"  (1.778 m)     Head Circumference --      Peak Flow --      Pain Score --      Pain Loc --      Pain Edu? --      Excl. in [place]? --     Most recent vital signs: Vitals:   12/24/21 1823  BP: (!) 156/70  Pulse: (!) 110  Resp: 20  Temp: 100.2 F (37.9 C)  SpO2: 96%     General: Awake, no distress.  Pleasant elderly male hard of hearing Head: Hematoma noted to the back of the head CV:  Good peripheral perfusion.  Normal rate Resp:  Normal effort.  Clear lung Abd:  No distention.  Soft and nontender Other:  Chronic indwelling Foley   ED Results / Procedures / Treatments   Labs (all labs ordered are listed, but only abnormal results are displayed) Labs Reviewed  RESP PANEL BY RT-PCR (FLU  A&B, COVID) ARPGX2 - Abnormal; Notable for the following components:      Result Value   SARS Coronavirus 2 by RT PCR POSITIVE (*)    All other components within normal limits  LACTIC ACID, PLASMA - Abnormal; Notable for the following components:   Lactic Acid, Venous 2.8 (*)    All other components within normal limits  COMPREHENSIVE METABOLIC PANEL - Abnormal; Notable for the following components:   Sodium 133 (*)    Chloride 96 (*)    Glucose, Bld 360 (*)    BUN 42 (*)    Creatinine, Ser 2.62 (*)    AST 62 (*)    GFR, Estimated 22 (*)    All other components within normal limits  CBC WITH DIFFERENTIAL/PLATELET - Abnormal; Notable for the following components:   RBC 3.65 (*)    Hemoglobin 9.9 (*)    HCT 29.9 (*)    Monocytes Absolute 1.4 (*)    All other components within normal limits  URINALYSIS, COMPLETE (UACMP) WITH  MICROSCOPIC - Abnormal; Notable for the following components:   Color, Urine YELLOW (*)    APPearance CLOUDY (*)    Glucose, UA 250 (*)    Hgb urine dipstick LARGE (*)    Protein, ur >300 (*)    Leukocytes,Ua LARGE (*)    RBC / HPF >50 (*)    WBC, UA >50 (*)    Bacteria, UA MANY (*)    Non Squamous Epithelial PRESENT (*)    All other components within normal limits  CULTURE, BLOOD (ROUTINE X 2)  CULTURE, BLOOD (ROUTINE X 2)  URINE CULTURE  PROTIME-INR  APTT  LACTIC ACID, PLASMA  PROCALCITONIN  PROCALCITONIN     EKG  My interpretation of EKG: Sinus tachycardia rate of 104, no ST elevation or T wave inversions, normal intervals   RADIOLOGY I have reviewed the xray personally and agree with radiology read x-rays without evidence of fractures, no pneumonia   PROCEDURES:  Critical Care performed: Yes, see critical care procedure note(s)  .Critical Care Performed by: Vanessa Pocahontas, MD Authorized by: Vanessa [place] Meares, MD   Critical care provider statement:    Critical care time (minutes):  30   Critical care was necessary to treat or prevent  imminent or life-threatening deterioration of the following conditions:  Sepsis and respiratory failure   Critical care was time spent personally by me on the following activities:  Development of treatment plan with patient or surrogate, discussions with consultants, evaluation of patient's response to treatment, examination of patient, ordering and review of laboratory studies, ordering and review of radiographic studies, ordering and performing treatments and interventions, pulse oximetry, re-evaluation of patient's condition and review of old charts   MEDICATIONS ORDERED IN ED: Medications  acetaminophen (TYLENOL) tablet 1,000 mg (1,000 mg Oral Given 12/24/21 1939)  lactated ringers bolus 1,000 mL (1,000 mLs Intravenous New Bag/Given 12/24/21 1900)     IMPRESSION / MDM / [place] / ED COURSE  I reviewed the triage vital signs and the nursing notes.  Patient with chronic Foley comes in with concerns for sepsis with tachycardia and fever. Differential diagnosis includes, but is not limited to, UTI.  Patient is persistently answered. A lot of blood in his urine which could just be from chronic Foley/UTI but will get CT to make sure no infected kidney stone, CT chest evaluate for any pneumonia, he has a hematoma on the back of his head.  CK is slightly elevated concerning for some mild rhabdo.  Patient getting IV fluids.  His COVID test was positive which most likely the site of his infection.  His lactate was elevated he is getting fluid.  Kidney function around baseline.  Hemoglobin slightly low but around his baseline.  CT imaging shows no acute pathology.  Does have some similar hydronephrosis given patient's urine I have covered him for possible UTI.  The nurses have pulled out his Foley.  Patient desatted down to 90% so patient was placed on 2 L given patient's COVID possible UTI and weakness patient is not safe to go home therefore I discussed the hospital team for  admission   The patient is on the cardiac monitor to evaluate for evidence of arrhythmia and/or significant heart rate changes.  Incidental findings on CT were discussed with family     FINAL CLINICAL IMPRESSION(S) / ED DIAGNOSES   Final diagnoses:  Sepsis, due to unspecified organism, unspecified whether acute organ dysfunction present ([place])  Weakness  COVID-19  Urinary tract infection without  hematuria, site unspecified     Rx / DC Orders   ED Discharge Orders     None        Note:  This document was prepared using Dragon voice recognition software and may include unintentional dictation errors.   Vanessa Carbonado, MD 12/24/21 2226

## 2021-12-24 NOTE — Assessment & Plan Note (Signed)
-   Glimepiride 2 mg p.o. daily has not been resumed by myself - Insulin SSI with at bedtime coverage ordered - Goal inpatient blood glucose levels 140-180

## 2021-12-24 NOTE — Hospital Course (Signed)
88 year old male with history of CAD, urinary retention, with indwelling Foley catheter, CAD with history of CABG more than 10 years ago, iron deficiency anemia, non-insulin-dependent diabetes mellitus type 2, hyperlipidemia CKD stage IV, who presents emergency department from home via EMS for chief concerns of altered mental status, weakness, and fall.  In the emergency department patient has received Tylenol 1000 mg p.o., ceftriaxone 1 g IV, lactated Ringer 1000 mL bolus, and additional sodium chloride 500 mL bolus.

## 2021-12-24 NOTE — Assessment & Plan Note (Signed)
Check B12 

## 2021-12-24 NOTE — Assessment & Plan Note (Addendum)
-   Patient denies any symptoms and SPO2 was 90% on room air before nasal cannula was placed - No indication for remdesivir and/or steroids at this time - Incentive spirometry and flutter valve - Albuterol every 4 hours as needed for shortness of breath and wheezing ordered

## 2021-12-24 NOTE — H&P (Addendum)
History and Physical   Jay Mathis ZOX:[id number] DOB: June 05, 1924 DOA: 12/24/2021  PCP: [name], MD  Patient coming from: Home via EMS  I have personally briefly reviewed patient's old medical records in [place].  Chief Concern: Weakness and fall, altered mental status  HPI: 88 year old male with history of CAD, urinary retention, with indwelling Foley catheter, CAD with history of CABG more than 10 years ago, iron deficiency anemia, non-insulin-dependent diabetes mellitus type 2, hyperlipidemia CKD stage IV, who presents emergency department from home via EMS for chief concerns of altered mental status, weakness, and fall.  In the emergency department patient has received Tylenol 1000 mg p.o., ceftriaxone 1 g IV, lactated Ringer 1000 mL bolus, and additional sodium chloride 500 mL bolus.  At bedside, Jay Mathis has severe hearing loss with ear aid in place. He states his ear aid is low on battery. He is able to tell me his name and his age of 88, and hospital.  He also notes that his son Jay Mathis is at bedside.  He denies any symptoms including shortness of breath and chest pain.  Son at bedside states that he has fallen twice.  Son denies any fever, vomiting that was reported.    Social history: He lives by himself and his sons come every day to help him. Son prepares his meals. He is a former tobacco use and quit 40-50 years ago. He does not have history of etoh or recreational drug use. He is retired and formerly worked in Special educational needs teacher.  Vaccination history: He is not vaccinated for covid 19. He had refused it.   ROS: Constitutional: no weight change, no fever ENT/Mouth: no sore throat, no rhinorrhea Eyes: no eye pain, no vision changes Cardiovascular: no chest pain, no dyspnea,  no edema, no palpitations Respiratory: + cough, no sputum, no wheezing Gastrointestinal: no nausea, no vomiting, no diarrhea, no constipation Genitourinary: no urinary incontinence, no  dysuria, no hematuria Musculoskeletal: no arthralgias, no myalgias Skin: no skin lesions, no pruritus, Neuro: + weakness, no loss of consciousness, no syncope Psych: no anxiety, no depression, + decrease appetite Heme/Lymph: no bruising, no bleeding  ED Course: Discussed with emergency medicine provider, patient requiring hospitalization for chief concerns of altered mental status, weakness, fall presumed secondary to urinary tract infection.  Vitals in the emergency department showed temperature of 100.3, respiration rate of 20, heart rate of 110, initial blood pressure 156/70, improved to 132/59, SPO2 of of 96% on room air.  Labs in the ED showed serum sodium 133, potassium 3.9, chloride 96, bicarb 25, BUN of 42, serum creatinine of 2.62, nonfasting blood glucose 360, GFR of 22, WBC 6.5, hemoglobin 9.9, platelet of 178.  Procalcitonin ordered by EDP was negative at 0.10.  UA was positive for large leukocytes.  Patient tested positive for COVID via PCR.  Influenza a/influenza B PCR were negative.  Blood cultures x2 are in process, urine culture is in process.  Patient was placed on 2 L nasal cannula because he desatted to 90% on room air.  Assessment/Plan  Principal Problem:   Lower urinary tract infectious disease Active Problems:   Sepsis ([place])   HLD (hyperlipidemia)   Diabetes (HCC)   GERD (gastroesophageal reflux disease)   CAD (coronary artery disease)   BPH (benign prostatic hyperplasia)   Iron deficiency anemia   CKD (chronic kidney disease) stage 4, GFR 15-29 ml/min (HCC)   Anemia in stage 4 chronic kidney disease (HCC)   Secondary hyperparathyroidism of renal origin (  [place])   Essential hypertension   Altered mental status   Elevated CK   Heart failure with preserved ejection fraction (HCC)   Chronic indwelling Foley catheter   Weakness   COVID-19 virus detected   Rib fracture   Right inguinal hernia   * Lower urinary tract infectious disease Assessment & Plan -  Increased risk for urinary tract infection due to presence of chronic indwelling Foley catheter - Present on admission - Status post ceftriaxone 1 g IV per EDP - Ordered ceftriaxone 2 g IV for 12/25/2021, 2 doses ordered - Follow-up on urine culture   Met sepsis criteria with increased respiration rate, heart rate, source of urine, increased lactic acid - Ceftriaxone IV as above - Status post sodium chloride 500 mL bolus, LR 1 L bolus - Ordered sodium chloride 100 mL/h, to complete 500 mL bag - Follow-up on urine culture, blood cultures x2  Cardiovascular and Mediastinum Heart failure with preserved ejection fraction (HCC) Assessment & Plan - Complete echo on 10/08/2021: read as ejection fraction 60 to 89%, grade 2 diastolic dysfunction  Essential hypertension Assessment & Plan - Patient takes amlodipine 10 mg daily, carvedilol 3.125 mg p.o. twice daily - Hydralazine injection 5 mg IV every 4 hours as needed for SBP greater than 180, 4 doses ordered  Digestive GERD (gastroesophageal reflux disease) Assessment & Plan - Pantoprazole 40 mg p.o. twice daily resumed  Endocrine Diabetes ([place]) Assessment & Plan - Glimepiride 2 mg p.o. daily has not been resumed by myself - Insulin SSI with at bedtime coverage ordered - Goal inpatient blood glucose levels 140-180  Musculoskeletal and Integument Rib fracture Assessment & Plan - Age indeterminant - Incentive spirometry and flutter valve  Genitourinary Anemia in stage 4 chronic kidney disease ([place]) Assessment & Plan - Hemoglobin on presentation is 9.9, in 2022, patient ranged from 10.0-10.7 - No transfusion indicated at this time, CBC in the a.m.  CKD (chronic kidney disease) stage 4, GFR 15-29 ml/min (HCC) Assessment & Plan - At baseline  BPH (benign prostatic hyperplasia) Assessment & Plan -With history of urinary retention - Foley replaced in the emergency department  Other Right inguinal hernia Assessment & Plan -  Chronic  COVID-19 virus detected Assessment & Plan - Patient denies any symptoms and SPO2 was 90% on room air before nasal cannula was placed - No indication for remdesivir and/or steroids at this time - Incentive spirometry and flutter valve - Albuterol every 4 hours as needed for shortness of breath and wheezing ordered  Weakness Assessment & Plan - Check B12  Chronic indwelling Foley catheter Assessment & Plan - Foley catheter was replaced by ED RN on day of admission; 12/24/2021  Elevated CK Assessment & Plan - Presumed secondary to fall - Status post lactated ringer 1 L bolus, sodium chloride 500 mL bolus - Ordered additional sodium chloride 100 mL/h IVF to complete 500 mL bag - Check CK in a.m. labs  HLD (hyperlipidemia) Assessment & Plan - Resumed atorvastatin 10 mg nightly  Chart reviewed.   DVT prophylaxis: Heparin 5000 units subcutaneous every 8 hours Code Status: DNR/DNI Diet: Heart healthy/carb modified Family Communication: Renee Ramus, son at bedside who is HCPOA Disposition Plan: Pending clinical course Consults called: None at this time Admission status: Telemetry cardiac, observation  Past Medical History:  Diagnosis Date   CAD (coronary artery disease)    CKD (chronic kidney disease) stage 4, GFR 15-29 ml/min (HCC)    Diabetes mellitus without complication (HCC)    GERD (gastroesophageal  reflux disease)    Hyperlipidemia    Hypertension    Iron deficiency anemia 07/30/2017   Nephrolithiasis    Past Surgical History:  Procedure Laterality Date   APPENDECTOMY     60 plus years ago   [place]     CORONARY ARTERY BYPASS GRAFT     Social History:  reports that he has quit smoking. He has never used smokeless tobacco. He reports that he does not drink alcohol and does not use drugs.  Allergies  Allergen Reactions   Sulfa Antibiotics Rash   Family History  Problem Relation Age of Onset   Prostate cancer Father     Prostate cancer Brother    Family history: Family history reviewed and not pertinent.  Prior to Admission medications   Medication Sig Start Date End Date Taking? Authorizing Provider  amLODipine (NORVASC) 10 MG tablet TAKE 1 TABLET BY MOUTH EVERY DAY 09/14/21   [name], MD  atorvastatin (LIPITOR) 10 MG tablet TAKE 1 TABLET BY MOUTH EVERYDAY AT BEDTIME 09/14/21   [name], MD  carvedilol (COREG) 3.125 MG tablet Take 1 tablet (3.125 mg total) by mouth 2 (two) times daily. 09/18/21   [name], MD  [facility] B-12 500 MCG tablet TAKE 1 TABLET BY MOUTH DAILY 09/14/21   Earlie Server, MD  folic acid (FOLVITE) 1 MG tablet TAKE 1 TABLET BY MOUTH EVERY DAY 04/27/21   Earlie Server, MD  furosemide (LASIX) 40 MG tablet TAKE 1 TABLET BY MOUTH EVERY DAY 09/14/21   [name], MD  glimepiride (AMARYL) 2 MG tablet TAKE 1 TABLET BY MOUTH EVERY DAY 09/14/21   [name], MD  glucose blood (TRUE METRIX BLOOD GLUCOSE TEST) test strip Use as instructed 10/23/21   [name], MD  MYRBETRIQ 25 MG TB24 tablet TAKE 1 TABLET BY MOUTH EVERY DAY 09/04/21   [name], MD  pantoprazole (PROTONIX) 40 MG tablet Take 1 tablet (40 mg total) by mouth 2 (two) times daily. 12/12/21   [name], MD  sodium bicarbonate 650 MG tablet TAKE 1 TABLET BY MOUTH TWICE A DAY 06/08/21   [name], MD   Physical Exam: Vitals:   12/24/21 2200 12/24/21 2230 12/24/21 2300 12/24/21 2330  BP: 135/65 123/61 132/68 135/69  Pulse: 90 78 82 77  Resp: _0 Temp:      TempSrc:      SpO2: 98% 100% 100% 100%  Weight:      Height:       Constitutional: appears younger than chronological age, frail, NAD, calm, comfortable Eyes: PERRL, lids and conjunctivae normal ENMT: Mucous membranes are dry-given that patient is not developing. Posterior pharynx clear of any exudate or lesions. Age-appropriate dentition.  Severe hearing loss. Neck: normal, supple, no masses, no thyromegaly Respiratory: clear to auscultation bilaterally,  no wheezing, no crackles. Normal respiratory effort. No accessory muscle use.  Cardiovascular: Regular rate and rhythm, no murmurs / rubs / gallops. No extremity edema. 2+ pedal pulses. No carotid bruits.  Abdomen: no tenderness, no masses palpated, no hepatosplenomegaly. Bowel sounds positive.  Musculoskeletal: no clubbing / cyanosis. No joint deformity upper and lower extremities. Good ROM, no contractures, no atrophy. Normal muscle tone.  GU: Indwelling Foley catheter in place Skin:  Bilateral lower extremity skin changes that per her son has been chronic since CABG surgery. Neurologic: Sensation intact. Strength 5/5 in all 4.  Psychiatric: Normal judgment and insight. Alert and oriented x 3. Normal mood.   EKG:  independently reviewed, showing junctional rhythm with rate of 104, QTc 457.  Chest x-ray on Admission: I personally reviewed and I agree with radiologist reading as below.  CT HEAD WO CONTRAST (5MM)  Result Date: 12/24/2021 CLINICAL DATA:  Mental status change, unknown cause; Neck trauma (Age >= 65y). Pt c/o increased weakness, pt had 2 falls today. Febrile and tachycardic. Unknown if hit head EXAM: CT HEAD WITHOUT CONTRAST CT CERVICAL SPINE WITHOUT CONTRAST TECHNIQUE: Multidetector CT imaging of the head and cervical spine was performed following the standard protocol without intravenous contrast. Multiplanar CT image reconstructions of the cervical spine were also generated. RADIATION DOSE REDUCTION: This exam was performed according to the departmental dose-optimization program which includes automated exposure control, adjustment of the mA and/or kV according to patient size and/or use of iterative reconstruction technique. COMPARISON:  None. FINDINGS: CT HEAD FINDINGS BRAIN: BRAIN Cerebral ventricle sizes are concordant with the degree of cerebral volume loss. Patchy and confluent areas of decreased attenuation are noted throughout the deep and periventricular white matter of the  cerebral hemispheres bilaterally, compatible with chronic microvascular ischemic disease. No evidence of large-territorial acute infarction. No parenchymal hemorrhage. No mass lesion. No extra-axial collection. No mass effect or midline shift. No hydrocephalus. Basilar cisterns are patent. Vascular: No hyperdense vessel. Atherosclerotic calcifications are present within the cavernous internal carotid arteries. Skull: No acute fracture or focal lesion. Sinuses/Orbits: Paranasal sinuses and mastoid air cells are clear. The orbits are unremarkable. Other: None. CT CERVICAL SPINE FINDINGS Alignment: Likely exaggerated cervical lordosis likely due to positioning. Alignment is otherwise normal. Skull base and vertebrae: Multilevel at least moderate degenerative changes of the spine. Associated left severe osseous neural foraminal stenosis at the C3-C4 and C4-C5 levels. No severe osseous central canal stenosis. No acute fracture. No aggressive appearing focal osseous lesion or focal pathologic process. Soft tissues and spinal canal: No prevertebral fluid or swelling. No visible canal hematoma. Upper chest: Unremarkable. Other: None. IMPRESSION: 1. No acute intracranial abnormality. 2. No acute displaced fracture or traumatic listhesis of the cervical spine. 3. Multilevel at least moderate degenerative changes of the spine. Associated left severe osseous neural foraminal stenosis at the C3-C4 and C4-C5 levels. Electronically Signed   By: Iven Finn M.D.   On: 12/24/2021 20:58   CT Cervical Spine Wo Contrast  Result Date: 12/24/2021 CLINICAL DATA:  Mental status change, unknown cause; Neck trauma (Age >= 65y). Pt c/o increased weakness, pt had 2 falls today. Febrile and tachycardic. Unknown if hit head EXAM: CT HEAD WITHOUT CONTRAST CT CERVICAL SPINE WITHOUT CONTRAST TECHNIQUE: Multidetector CT imaging of the head and cervical spine was performed following the standard protocol without intravenous contrast.  Multiplanar CT image reconstructions of the cervical spine were also generated. RADIATION DOSE REDUCTION: This exam was performed according to the departmental dose-optimization program which includes automated exposure control, adjustment of the mA and/or kV according to patient size and/or use of iterative reconstruction technique. COMPARISON:  None. FINDINGS: CT HEAD FINDINGS BRAIN: BRAIN Cerebral ventricle sizes are concordant with the degree of cerebral volume loss. Patchy and confluent areas of decreased attenuation are noted throughout the deep and periventricular white matter of the cerebral hemispheres bilaterally, compatible with chronic microvascular ischemic disease. No evidence of large-territorial acute infarction. No parenchymal hemorrhage. No mass lesion. No extra-axial collection. No mass effect or midline shift. No hydrocephalus. Basilar cisterns are patent. Vascular: No hyperdense vessel. Atherosclerotic calcifications are present within the cavernous internal carotid arteries. Skull: No acute fracture or focal lesion. Sinuses/Orbits:  Paranasal sinuses and mastoid air cells are clear. The orbits are unremarkable. Other: None. CT CERVICAL SPINE FINDINGS Alignment: Likely exaggerated cervical lordosis likely due to positioning. Alignment is otherwise normal. Skull base and vertebrae: Multilevel at least moderate degenerative changes of the spine. Associated left severe osseous neural foraminal stenosis at the C3-C4 and C4-C5 levels. No severe osseous central canal stenosis. No acute fracture. No aggressive appearing focal osseous lesion or focal pathologic process. Soft tissues and spinal canal: No prevertebral fluid or swelling. No visible canal hematoma. Upper chest: Unremarkable. Other: None. IMPRESSION: 1. No acute intracranial abnormality. 2. No acute displaced fracture or traumatic listhesis of the cervical spine. 3. Multilevel at least moderate degenerative changes of the spine. Associated  left severe osseous neural foraminal stenosis at the C3-C4 and C4-C5 levels. Electronically Signed   By: Iven Finn M.D.   On: 12/24/2021 20:58   DG Chest Port 1 View  Result Date: 12/24/2021 CLINICAL DATA:  Sepsis EXAM: PORTABLE CHEST 1 VIEW COMPARISON:  08/07/2019 FINDINGS: Mild cardiomegaly. No focal airspace consolidation or pulmonary edema. Normal pleural spaces. IMPRESSION: Mild cardiomegaly without focal airspace disease. Electronically Signed   By: Ulyses Jarred M.D.   On: 12/24/2021 20:02   DG Knee Complete 4 Views Left  Result Date: 12/24/2021 CLINICAL DATA:  Fall EXAM: LEFT KNEE - COMPLETE 4+ VIEW COMPARISON:  None. FINDINGS: No evidence of fracture, dislocation, or joint effusion. No evidence of arthropathy or other focal bone abnormality. Soft tissues are unremarkable. IMPRESSION: Negative. Electronically Signed   By: Ulyses Jarred M.D.   On: 12/24/2021 20:02   DG Knee Complete 4 Views Right  Result Date: 12/24/2021 CLINICAL DATA:  Fall EXAM: RIGHT KNEE - COMPLETE 4+ VIEW COMPARISON:  None. FINDINGS: No evidence of fracture, dislocation, or joint effusion. No evidence of arthropathy or other focal bone abnormality. Soft tissues are unremarkable. IMPRESSION: Negative. Electronically Signed   By: Ulyses Jarred M.D.   On: 12/24/2021 20:02   CT CHEST ABDOMEN PELVIS WO CONTRAST  Result Date: 12/24/2021 CLINICAL DATA:  Multiple recent falls. EXAM: CT CHEST, ABDOMEN AND PELVIS WITHOUT CONTRAST TECHNIQUE: Multidetector CT imaging of the chest, abdomen and pelvis was performed following the standard protocol without IV contrast. RADIATION DOSE REDUCTION: This exam was performed according to the departmental dose-optimization program which includes automated exposure control, adjustment of the mA and/or kV according to patient size and/or use of iterative reconstruction technique. COMPARISON:  August 07, 2019 FINDINGS: CT CHEST FINDINGS Cardiovascular: There is marked severity calcification  of the aortic arch, without evidence of aortic aneurysm. Normal heart size with marked severity coronary artery calcification. No pericardial effusion. Mediastinum/Nodes: No enlarged mediastinal, hilar, or axillary lymph nodes. Thyroid gland, trachea, and esophagus demonstrate no significant findings. Lungs/Pleura: There is no evidence of acute infiltrate, pleural effusion or pneumothorax. Musculoskeletal: Multiple sternal wires are seen. A posterior 12 left rib fracture of indeterminate age is seen. Multilevel degenerative changes seen throughout the thoracic spine. CT ABDOMEN PELVIS FINDINGS Hepatobiliary: No focal liver abnormality is seen. No gallstones, gallbladder wall thickening, or biliary dilatation. Pancreas: Unremarkable. No pancreatic ductal dilatation or surrounding inflammatory changes. Spleen: Normal in size without focal abnormality. Adrenals/Urinary Tract: Adrenal glands are unremarkable. Stable diffuse renal cortical thinning is seen with a stable 1.8 cm diameter cyst noted within the mid right kidney. A 0.4 cm round hyperdense focus is also seen within the mid to lower right kidney. This is not clearly identified on the prior study (axial CT image 67, CT series  2). Subcentimeter parenchymal calcifications are seen on the right. A stable 1.2 cm exophytic hyperdense lesion is seen along the lateral aspect of the mid left kidney (axial CT image 55, CT series 2). There is marked severity right-sided hydronephrosis which is mildly decreased in severity when compared to the prior study. A mild amount of air is also seen within the right renal pelvis and anterior aspect of and intrarenal calyx (axial CT images 58 through 67, CT series 2). This represents a new finding. Persistent marked severity left-sided hydronephrosis is seen. A Foley catheter is seen within a mildly distended urinary bladder. A mild amount of air is seen within the bladder lumen with a punctate focus of air noted within the lateral  wall of the urinary bladder on the right (axial CT image 103, CT series 2). Mildly lobulated, asymmetric bladder wall thickening is seen on the left (axial CT images 102 through 104, CT series 2). Urinary bladder diverticula are seen within this region on the prior study. A thin layer of increased attenuation (approximately 81.42 Hounsfield units) is noted within the dependent portion of the urinary bladder on the right. This is a new finding when compared to the prior exam. Stomach/Bowel: There is a small hiatal hernia. The appendix is surgically absent. No evidence of bowel dilatation. Noninflamed diverticula are seen throughout the sigmoid colon. Vascular/Lymphatic: Aortic atherosclerosis. No enlarged abdominal or pelvic lymph nodes. Reproductive: There is mild to moderate severity prostate gland enlargement. Other: A predominantly stable 5.6 cm x 3.9 cm right inguinal hernia is seen. This extends into the scrotum on the right where it reaches a maximum size of 7.1 cm x 6.5 cm. This area of herniation contains fat and multiple nondilated loops of small bowel. No abdominopelvic ascites. Musculoskeletal: Multilevel degenerative changes seen throughout the lumbar spine. IMPRESSION: 1. Posterior 12 left rib fracture of indeterminate age. Correlation with point tenderness is recommended. 2. Bilateral hydronephrosis, mildly decreased in severity when compared to the prior study, with a mild to moderate amount of air seen within the right renal collecting system. 3. Mildly lobulated, asymmetric bladder wall thickening on the left with associated bladder diverticula. The presence of an underlying neoplastic process cannot be excluded. Correlation with cystoscopy is recommended. 4. Increased attenuation within the urinary bladder which may represent a thin layer of urinary bladder calculi. 5. Findings which may represent small bilateral hemorrhagic renal cysts. Correlation with nonemergent renal ultrasound is recommended.  6. Small hiatal hernia. 7. Sigmoid diverticulosis. 8. Large, predominant stable right inguinal hernia. 9. Prostatomegaly. Aortic Atherosclerosis (ICD10-I70.0). Electronically Signed   By: Virgina Norfolk M.D.   On: 12/24/2021 21:15    Labs on Admission: I have personally reviewed following labs  CBC: Recent Labs  Lab 12/24/21 1828  WBC 6.5  NEUTROABS 4.2  HGB 9.9*  HCT 29.9*  MCV 81.9  PLT 782   Basic Metabolic Panel: Recent Labs  Lab 12/24/21 1828  NA 133*  K 3.9  CL 96*  CO2 25  GLUCOSE 360*  BUN 42*  CREATININE 2.62*  CALCIUM 9.0   GFR: Estimated Creatinine Clearance: 17.4 mL/min (A) (by C-G formula based on SCr of 2.62 mg/dL (H)).  Liver Function Tests: Recent Labs  Lab 12/24/21 1828  AST 62*  ALT 38  ALKPHOS 97  BILITOT 0.9  PROT 7.6  ALBUMIN 3.7   Coagulation Profile: Recent Labs  Lab 12/24/21 1828  INR 1.0   Cardiac Enzymes: Recent Labs  Lab 12/24/21 1851  CKTOTAL 602*  Urine analysis:    Component Value Date/Time   COLORURINE YELLOW 12/24/2021 2115   APPEARANCEUR CLOUDY (A) 12/24/2021 2115   LABSPEC 1.020 12/24/2021 2115   PHURINE 7.0 12/24/2021 2115   GLUCOSEU 250 (A) 12/24/2021 2115   HGBUR LARGE (A) 12/24/2021 2115   BILIRUBINUR NEGATIVE 12/24/2021 2115   KETONESUR NEGATIVE 12/24/2021 2115   PROTEINUR 100 (A) 12/24/2021 2115   NITRITE NEGATIVE 12/24/2021 2115   LEUKOCYTESUR SMALL (A) 12/24/2021 2115   Dr. Tobie Poet [facility]  If 7PM-7AM, please contact overnight-coverage provider If 7AM-7PM, please contact day coverage provider [email or web address]  12/25/2021, 12:00 AM

## 2021-12-24 NOTE — Assessment & Plan Note (Addendum)
-   Presumed secondary to fall - Status post lactated ringer 1 L bolus, sodium chloride 500 mL bolus - Ordered additional sodium chloride 100 mL/h IVF to complete 500 mL bag - Check CK in a.m. labs

## 2021-12-24 NOTE — Assessment & Plan Note (Signed)
-   Increased risk for urinary tract infection due to presence of chronic indwelling Foley catheter - Present on admission - Status post ceftriaxone 1 g IV per EDP - Ordered ceftriaxone 2 g IV for 12/25/2021, 2 doses ordered - Follow-up on urine culture

## 2021-12-24 NOTE — Assessment & Plan Note (Signed)
-   Foley catheter was replaced by ED RN on day of admission; 12/24/2021

## 2021-12-24 NOTE — Assessment & Plan Note (Signed)
-   Pantoprazole 40 mg p.o. twice daily resumed

## 2021-12-24 NOTE — Assessment & Plan Note (Signed)
-   Age indeterminant - Incentive spirometry and flutter valve

## 2021-12-24 NOTE — ED Provider Triage Note (Signed)
Emergency Medicine Provider Triage Evaluation Note  Jay Mathis , a 88 y.o. male  was evaluated in triage.  Pt complains of generalized weakness and mechanical fall in the bathroom today.  Soreness to his bilateral knees and unable to get up, so 911 was called.  He lives at home by himself and his son comes by to check in on him.  Febrile and tachycardic with EMS.  Indwelling catheter in place, unknown when it was placed..  Review of Systems  Positive: Generalized weakness, malaise and fall without syncope. Negative:   Physical Exam  There were no vitals taken for this visit. Gen:   Awake, no distress   Resp:  Normal effort  MSK:   Moves extremities without difficulty  Other:    Medical Decision Making  Medically screening exam initiated at 6:24 PM.  Appropriate orders placed.  Torris Thereasa Parkin was informed that the remainder of the evaluation will be completed by another provider, this initial triage assessment does not replace that evaluation, and the importance of remaining in the ED until their evaluation is complete.  Sepsis screening protocols.  We will have to replace Foley catheter with a fresh one for a urine sample   Vladimir Crofts, MD 12/24/21 1825

## 2021-12-24 NOTE — ED Triage Notes (Signed)
Pt via EMS from home. Pt c/o increased weakness, pt had 2 falls today. Pt c/o knee pain. Denies any other pain. Denies SOB. Pt is A&OX4 and NAD

## 2021-12-24 NOTE — Assessment & Plan Note (Signed)
-   Patient takes amlodipine 10 mg daily, carvedilol 3.125 mg p.o. twice daily - Hydralazine injection 5 mg IV every 4 hours as needed for SBP greater than 180, 4 doses ordered

## 2021-12-24 NOTE — Assessment & Plan Note (Signed)
-   Complete echo on 10/08/2021: read as ejection fraction 60 to 15%, grade 2 diastolic dysfunction

## 2021-12-24 NOTE — Assessment & Plan Note (Signed)
-   Resumed atorvastatin 10 mg nightly 

## 2021-12-24 NOTE — Assessment & Plan Note (Signed)
At baseline 

## 2021-12-24 NOTE — Assessment & Plan Note (Signed)
Chronic. 

## 2021-12-25 ENCOUNTER — Encounter: Payer: Self-pay | Admitting: Internal Medicine

## 2021-12-25 DIAGNOSIS — G9341 Metabolic encephalopathy: Secondary | ICD-10-CM | POA: Diagnosis present

## 2021-12-25 DIAGNOSIS — N390 Urinary tract infection, site not specified: Secondary | ICD-10-CM | POA: Diagnosis present

## 2021-12-25 DIAGNOSIS — W19XXXA Unspecified fall, initial encounter: Secondary | ICD-10-CM | POA: Diagnosis present

## 2021-12-25 DIAGNOSIS — E872 Acidosis, unspecified: Secondary | ICD-10-CM | POA: Diagnosis present

## 2021-12-25 DIAGNOSIS — N1330 Unspecified hydronephrosis: Secondary | ICD-10-CM | POA: Diagnosis present

## 2021-12-25 DIAGNOSIS — M6282 Rhabdomyolysis: Secondary | ICD-10-CM | POA: Diagnosis present

## 2021-12-25 DIAGNOSIS — U071 COVID-19: Secondary | ICD-10-CM | POA: Diagnosis present

## 2021-12-25 DIAGNOSIS — N184 Chronic kidney disease, stage 4 (severe): Secondary | ICD-10-CM | POA: Diagnosis present

## 2021-12-25 DIAGNOSIS — Z66 Do not resuscitate: Secondary | ICD-10-CM | POA: Diagnosis present

## 2021-12-25 DIAGNOSIS — R4182 Altered mental status, unspecified: Secondary | ICD-10-CM | POA: Diagnosis present

## 2021-12-25 DIAGNOSIS — E871 Hypo-osmolality and hyponatremia: Secondary | ICD-10-CM | POA: Diagnosis present

## 2021-12-25 DIAGNOSIS — I130 Hypertensive heart and chronic kidney disease with heart failure and stage 1 through stage 4 chronic kidney disease, or unspecified chronic kidney disease: Secondary | ICD-10-CM | POA: Diagnosis present

## 2021-12-25 DIAGNOSIS — D631 Anemia in chronic kidney disease: Secondary | ICD-10-CM | POA: Diagnosis present

## 2021-12-25 DIAGNOSIS — I2510 Atherosclerotic heart disease of native coronary artery without angina pectoris: Secondary | ICD-10-CM | POA: Diagnosis present

## 2021-12-25 DIAGNOSIS — G9349 Other encephalopathy: Secondary | ICD-10-CM | POA: Diagnosis present

## 2021-12-25 DIAGNOSIS — J9601 Acute respiratory failure with hypoxia: Secondary | ICD-10-CM | POA: Diagnosis not present

## 2021-12-25 DIAGNOSIS — K219 Gastro-esophageal reflux disease without esophagitis: Secondary | ICD-10-CM | POA: Diagnosis present

## 2021-12-25 DIAGNOSIS — Z23 Encounter for immunization: Secondary | ICD-10-CM | POA: Diagnosis not present

## 2021-12-25 DIAGNOSIS — E1122 Type 2 diabetes mellitus with diabetic chronic kidney disease: Secondary | ICD-10-CM | POA: Diagnosis present

## 2021-12-25 DIAGNOSIS — D62 Acute posthemorrhagic anemia: Secondary | ICD-10-CM | POA: Diagnosis not present

## 2021-12-25 DIAGNOSIS — J690 Pneumonitis due to inhalation of food and vomit: Secondary | ICD-10-CM | POA: Diagnosis not present

## 2021-12-25 DIAGNOSIS — Z515 Encounter for palliative care: Secondary | ICD-10-CM | POA: Diagnosis not present

## 2021-12-25 DIAGNOSIS — Y92009 Unspecified place in unspecified non-institutional (private) residence as the place of occurrence of the external cause: Secondary | ICD-10-CM | POA: Diagnosis not present

## 2021-12-25 DIAGNOSIS — N2581 Secondary hyperparathyroidism of renal origin: Secondary | ICD-10-CM | POA: Diagnosis present

## 2021-12-25 DIAGNOSIS — N400 Enlarged prostate without lower urinary tract symptoms: Secondary | ICD-10-CM | POA: Diagnosis not present

## 2021-12-25 DIAGNOSIS — I5032 Chronic diastolic (congestive) heart failure: Secondary | ICD-10-CM | POA: Diagnosis present

## 2021-12-25 DIAGNOSIS — A4189 Other specified sepsis: Secondary | ICD-10-CM | POA: Diagnosis present

## 2021-12-25 DIAGNOSIS — R443 Hallucinations, unspecified: Secondary | ICD-10-CM | POA: Diagnosis not present

## 2021-12-25 DIAGNOSIS — Z978 Presence of other specified devices: Secondary | ICD-10-CM | POA: Diagnosis not present

## 2021-12-25 DIAGNOSIS — S2232XA Fracture of one rib, left side, initial encounter for closed fracture: Secondary | ICD-10-CM | POA: Diagnosis present

## 2021-12-25 DIAGNOSIS — E876 Hypokalemia: Secondary | ICD-10-CM | POA: Diagnosis not present

## 2021-12-25 DIAGNOSIS — Y92019 Unspecified place in single-family (private) house as the place of occurrence of the external cause: Secondary | ICD-10-CM | POA: Diagnosis not present

## 2021-12-25 DIAGNOSIS — Y9301 Activity, walking, marching and hiking: Secondary | ICD-10-CM | POA: Diagnosis present

## 2021-12-25 LAB — BASIC METABOLIC PANEL
Anion gap: 10 (ref 5–15)
BUN: 37 mg/dL — ABNORMAL HIGH (ref 8–23)
CO2: 27 mmol/L (ref 22–32)
Calcium: 8.8 mg/dL — ABNORMAL LOW (ref 8.9–10.3)
Chloride: 97 mmol/L — ABNORMAL LOW (ref 98–111)
Creatinine, Ser: 2.36 mg/dL — ABNORMAL HIGH (ref 0.61–1.24)
GFR, Estimated: 25 mL/min — ABNORMAL LOW (ref >60)
Glucose, Bld: 204 mg/dL — ABNORMAL HIGH (ref 70–99)
Potassium: 3.3 mmol/L — ABNORMAL LOW (ref 3.5–5.1)
Sodium: 134 mmol/L — ABNORMAL LOW (ref 135–145)

## 2021-12-25 LAB — CBC
HCT: 29.3 % — ABNORMAL LOW (ref 39.0–52.0)
Hemoglobin: 9.9 g/dL — ABNORMAL LOW (ref 13.0–17.0)
MCH: 27.1 pg (ref 26.0–34.0)
MCHC: 33.8 g/dL (ref 30.0–36.0)
MCV: 80.3 fL (ref 80.0–100.0)
Platelets: 155 10*3/uL (ref 150–400)
RBC: 3.65 MIL/uL — ABNORMAL LOW (ref 4.22–5.81)
RDW: 13.6 % (ref 11.5–15.5)
WBC: 6.8 10*3/uL (ref 4.0–10.5)
nRBC: 0 % (ref 0.0–0.2)

## 2021-12-25 LAB — HEMOGLOBIN A1C
Hgb A1c MFr Bld: 9.7 % — ABNORMAL HIGH (ref 4.8–5.6)
Mean Plasma Glucose: 231.69 mg/dL

## 2021-12-25 LAB — GLUCOSE, CAPILLARY
Glucose-Capillary: 158 mg/dL — ABNORMAL HIGH (ref 70–99)
Glucose-Capillary: 227 mg/dL — ABNORMAL HIGH (ref 70–99)
Glucose-Capillary: 141 mg/dL — ABNORMAL HIGH (ref 70–99)
Glucose-Capillary: 134 mg/dL — ABNORMAL HIGH (ref 70–99)

## 2021-12-25 LAB — URINALYSIS, MICROSCOPIC (REFLEX)

## 2021-12-25 LAB — CK: Total CK: 5867 U/L — ABNORMAL HIGH (ref 49–397)

## 2021-12-25 LAB — PROCALCITONIN: Procalcitonin: 0.23 ng/mL

## 2021-12-25 LAB — VITAMIN B12: Vitamin B-12: 1211 pg/mL — ABNORMAL HIGH (ref 180–914)

## 2021-12-25 LAB — MAGNESIUM: Magnesium: 1.8 mg/dL (ref 1.7–2.4)

## 2021-12-25 LAB — PHOSPHORUS: Phosphorus: 3.7 mg/dL (ref 2.5–4.6)

## 2021-12-25 MED ORDER — AMLODIPINE BESYLATE 5 MG PO TABS
5.0000 mg | ORAL_TABLET | Freq: Every day | ORAL | Status: DC
  Administered 2021-12-26 – 2022-01-05 (×11): 5 mg via ORAL
  Filled 2021-12-25 (×11): qty 1

## 2021-12-25 MED ORDER — SODIUM CHLORIDE 0.9 % IV SOLN
100.0000 mg | Freq: Every day | INTRAVENOUS | Status: AC
  Administered 2021-12-26 – 2021-12-29 (×4): 100 mg via INTRAVENOUS
  Filled 2021-12-25 (×4): qty 100

## 2021-12-25 MED ORDER — CHLORHEXIDINE GLUCONATE CLOTH 2 % EX PADS
6.0000 | MEDICATED_PAD | Freq: Every day | CUTANEOUS | Status: DC
  Administered 2021-12-25 – 2022-01-04 (×11): 6 via TOPICAL

## 2021-12-25 MED ORDER — ASCORBIC ACID 500 MG PO TABS
500.0000 mg | ORAL_TABLET | Freq: Every day | ORAL | Status: DC
  Administered 2021-12-26 – 2022-01-05 (×11): 500 mg via ORAL
  Filled 2021-12-25 (×11): qty 1

## 2021-12-25 MED ORDER — ADULT MULTIVITAMIN W/MINERALS CH
1.0000 | ORAL_TABLET | Freq: Every day | ORAL | Status: DC
  Administered 2021-12-26 – 2022-01-05 (×11): 1 via ORAL
  Filled 2021-12-25 (×12): qty 1

## 2021-12-25 MED ORDER — DM-GUAIFENESIN ER 30-600 MG PO TB12
1.0000 | ORAL_TABLET | Freq: Two times a day (BID) | ORAL | Status: DC
  Administered 2021-12-25 – 2022-01-05 (×18): 1 via ORAL
  Filled 2021-12-25 (×19): qty 1

## 2021-12-25 MED ORDER — POTASSIUM CHLORIDE 2 MEQ/ML IV SOLN
INTRAVENOUS | Status: DC
  Filled 2021-12-25 (×2): qty 1000

## 2021-12-25 MED ORDER — ZINC SULFATE 220 (50 ZN) MG PO CAPS
220.0000 mg | ORAL_CAPSULE | Freq: Every day | ORAL | Status: DC
  Administered 2021-12-26 – 2022-01-05 (×11): 220 mg via ORAL
  Filled 2021-12-25 (×11): qty 1

## 2021-12-25 MED ORDER — CARVEDILOL 3.125 MG PO TABS
3.1250 mg | ORAL_TABLET | Freq: Two times a day (BID) | ORAL | Status: DC
  Administered 2021-12-25 – 2022-01-05 (×20): 3.125 mg via ORAL
  Filled 2021-12-25 (×21): qty 1

## 2021-12-25 MED ORDER — SODIUM CHLORIDE 0.9 % IV SOLN
200.0000 mg | Freq: Once | INTRAVENOUS | Status: AC
  Administered 2021-12-25: 21:00:00 200 mg via INTRAVENOUS
  Filled 2021-12-25: qty 200

## 2021-12-25 NOTE — Assessment & Plan Note (Signed)
Continue PPI ?

## 2021-12-25 NOTE — Hospital Course (Addendum)
Jay Mathis is a 88 y.o. M w/ CAD, urinary retention with indwelling Foley catheter since August 2020 (follows with urologist Dr. Diamantina Providence), CAD s/p CABG, IDA, NIDDM, and CKD IV baseline Cr 1.8-2.8 who presented with altered mental status, weakness, and fall.  In the ER, patient was altered, with tachycardia, tachypnea, lactic acidosis and confusion.  COVID+.  Foley was exchanged in the ED.  Patient was started on empiric IV Rocephin pending urine cultures.  CK, initially 602 >> 5867 the next morning.

## 2021-12-25 NOTE — Consult Note (Signed)
Remdesivir - Pharmacy Brief Note   O:  ALT: 62 CXR: Mild cardiomegaly without focal airspace disease. SpO2: 97% on 2 L [place]   A/P:  Remdesivir 200 mg IVPB once followed by 100 mg IVPB daily x 4 days.   Dorothe Pea, PharmD, BCPS Clinical Pharmacist   12/25/2021 5:07 PM

## 2021-12-25 NOTE — Assessment & Plan Note (Addendum)
BP stable --Continue Coreg, amlodipine

## 2021-12-25 NOTE — Assessment & Plan Note (Addendum)
Hbg stable.  Iron studies normal on 12/19/20.

## 2021-12-25 NOTE — Assessment & Plan Note (Signed)
No acute issues. Monitor. 

## 2021-12-25 NOTE — Assessment & Plan Note (Signed)
With chronic urinary retention and indwelling Foley since August 2020. Follows with Dr. Diamantina Providence. Foley replaced in ED 1/15

## 2021-12-25 NOTE — Assessment & Plan Note (Signed)
Hold Lipitor with rhabdomyolysis.

## 2021-12-25 NOTE — Assessment & Plan Note (Addendum)
Hbg on admission 9.9.  Baseline in 2022 was 10.0-10.7.  Stable.

## 2021-12-25 NOTE — Assessment & Plan Note (Addendum)
Uncontrolled.  Last A1c 9.7%. Glucose elevated -Hold glipizide -Continue Sliding scale Novolog - Start linagliptin

## 2021-12-25 NOTE — Assessment & Plan Note (Addendum)
Pt had two falls at home, due to profound weakness in setting of infections.  Ambulates with a walker.   --PT / OT evaluations --Fall precautions --Treat infections --Optimize nutrition

## 2021-12-25 NOTE — Assessment & Plan Note (Addendum)
POA with tachypnea, tachycardia and decreased mentation confusion  Sepsis due to COVID  Viral sepsis

## 2021-12-25 NOTE — Assessment & Plan Note (Addendum)
Remdesivir completed -Continue steroids

## 2021-12-25 NOTE — Assessment & Plan Note (Signed)
CT on admission noted a posterior 12th LEFT rib fracture of indeterminate age.  Possibly sustained in recent falls vs more remote.   --Supportive care --Pain control PRN --Incentive spirometrer

## 2021-12-25 NOTE — Plan of Care (Signed)

## 2021-12-25 NOTE — Assessment & Plan Note (Signed)
Due to CKD ?

## 2021-12-25 NOTE — Assessment & Plan Note (Addendum)
-  Hold atorvastatin given Rhabdo -Continue Coreg

## 2021-12-25 NOTE — Assessment & Plan Note (Addendum)
POA, at baseline, patient lives independently, alone, no cognitive impairment.  Here he was confused, and disoriented. Seemed to be hallucinating during encounter this time. --Delirium precautions

## 2021-12-25 NOTE — Assessment & Plan Note (Signed)
Follows with urology for monthly exchanges.  Foley was replaced on admission in the ED on 1/15.

## 2021-12-25 NOTE — TOC Initial Note (Signed)
Transition of Care [facility]) - Initial/Assessment Note    Patient Details  Name: Jay Mathis MRN: [id number] Date of Birth: 1924-06-22  [facility] of [facility]) CM/SW Contact:    Pete Pelt, RN Phone Number: 12/25/2021, 4:29 PM  Clinical Narrative:   Called patient in room due to isolation.  Patient states he does not feel well and asked that I call his son, [name].  Spoke to patient's son, [name] who states patient is independent at home but both of patient's sons check on him several times a day.  [name] states that he is patient's power of attorney and is responsible for transportation and picking up medications, as well as assisting with medication administration.  Son just anticipates to return to home health on discharge, but does not know the agency name.  TOC contact information given, TOC to follow.            Expected Discharge Plan: [place] (resuming home health) Barriers to Discharge: Continued Medical Work up   Patient Goals and CMS Choice     Choice offered to / list presented to : NA  Expected Discharge Plan and Services Expected Discharge Plan: [place] (resuming home health)   Discharge Planning Services: CM Consult Post Acute Care Choice: NA Living arrangements for the past 2 months: Single Family Home                                      Prior Living Arrangements/Services Living arrangements for the past 2 months: Single Family Home Lives with:: Self Patient language and need for interpreter reviewed::  (Spoke to patient's son) Do you feel safe going back to the place where you live?: Yes (yes)      Need for Family Participation in Patient Care: Yes (Comment) Care giver support system in place?: Yes (comment) Current home services: Homehealth aide Criminal Activity/Legal Involvement Pertinent to Current Situation/Hospitalization: No - Comment as needed  Activities of Daily Living Home Assistive  Devices/Equipment: Walker (specify type) ADL Screening (condition at time of admission) Patient's cognitive ability adequate to safely complete daily activities?: No Is the patient deaf or have difficulty hearing?: Yes Does the patient have difficulty seeing, even when wearing glasses/contacts?: No Does the patient have difficulty concentrating, remembering, or making decisions?: No Patient able to express need for assistance with ADLs?: Yes Does the patient have difficulty dressing or bathing?: Yes Independently performs ADLs?: No Communication: Independent Dressing (OT): Independent Grooming: Independent Feeding: Independent Bathing: Needs assistance Is this a change from baseline?: Change from baseline, expected to last >3 days Toileting: Needs assistance Is this a change from baseline?: Change from baseline, expected to last >3days In/Out Bed: Needs assistance Is this a change from baseline?: Change from baseline, expected to last >3 days Walks in Home: Needs assistance Is this a change from baseline?: Change from baseline, expected to last >3 days Does the patient have difficulty walking or climbing stairs?: Yes Weakness of Legs: Both Weakness of Arms/Hands: None  Permission Sought/Granted Permission sought to share information with : Case Manager Permission granted to share information with : Yes, Verbal Permission Granted              Emotional Assessment         Alcohol / Substance Use: Not Applicable Psych Involvement: No (comment)  Admission diagnosis:  Altered mental status [R41.82] Weakness [R53.1] Urinary  tract infection without hematuria, site unspecified [N39.0] Sepsis, due to unspecified organism, unspecified whether acute organ dysfunction present ([place]) [A41.9] COVID-19 [U07.1] Patient Active Problem List   Diagnosis Date Noted   Altered mental status 12/24/2021   Elevated CK 12/24/2021   Heart failure with preserved ejection fraction ([place])  12/24/2021   Chronic indwelling Foley catheter 12/24/2021   Weakness 12/24/2021   COVID-19 virus detected 12/24/2021   Rib fracture 12/24/2021   Right inguinal hernia 12/24/2021   Leg swelling 08/14/2021   Cellulitis of left lower extremity 07/19/2021   Cellulitis and abscess of foot, except toes 09/21/2020   Essential hypertension 08/12/2020   Benign hypertensive kidney disease with chronic kidney disease 02/10/2020   Hematuria 02/10/2020   Hyperkalemia 02/10/2020   Hyposmolality and/or hyponatremia 69/62/9528   Metabolic acidosis 41/32/4401   Proteinuria 02/10/2020   Secondary hyperparathyroidism of renal origin ([place]) 02/10/2020   UTI (urinary tract infection) 08/07/2019   Encounter for screening for depression 10/01/2018   CKD (chronic kidney disease) stage 4, GFR 15-29 ml/min (HCC) 03/27/2018   Anemia in stage 4 chronic kidney disease ([place]) 03/27/2018   Iron deficiency anemia 07/30/2017   CAD (coronary artery disease) 07/22/2017   BPH (benign prostatic hyperplasia) 07/22/2017   Sepsis ([place]) 08/04/2016   Lower urinary tract infectious disease 08/04/2016   HLD (hyperlipidemia) 08/04/2016   Diabetes ([place]) 08/04/2016   GERD (gastroesophageal reflux disease) 08/04/2016   Acute on chronic kidney failure ([place]) 08/04/2016   PCP:  [name], MD Pharmacy:   [id number] - [place], [place] - [address] [place] [address] Phone: [phone] Fax: [phone]     Social Determinants of Health (SDOH) Interventions    Readmission Risk Interventions No flowsheet data found.

## 2021-12-25 NOTE — Assessment & Plan Note (Addendum)
POA.  Due to trauma with falls vs possibly Covid-19.   Initial CK 602 >> 5867>> 7933 > 3000 -Hold fluids

## 2021-12-25 NOTE — Assessment & Plan Note (Addendum)
POA, UA with large Hbg Urine culture with more than 100,000 colonies multiple species, suggest recollection.  Suspect acute illness more related to Covid than UTI UTI ruled out

## 2021-12-25 NOTE — Assessment & Plan Note (Addendum)
Resolved with replacement.

## 2021-12-25 NOTE — Assessment & Plan Note (Addendum)
Stable, pt euvolemic to slightly dry. Echo 10/08/2021: EF 60 to 90%, grade 2 diastolic dysfunction. --Continue home Coreg --Hold lasix

## 2021-12-25 NOTE — Progress Notes (Signed)
Jay Mathis with the lab called and stated that patients urinalysis had WBC clumps in it. Made MD aware.

## 2021-12-25 NOTE — Assessment & Plan Note (Addendum)
Renal function appears at baseline.  Cr on admission 2.36, similar to past several.

## 2021-12-25 NOTE — Progress Notes (Addendum)
Progress Note   Patient: Jay Mathis IRJ:[id number] DOB: October 19, 1924 DOA: 12/24/2021     0 DOS: the patient was seen and examined on 12/25/2021   Brief hospital course: 88 year old male with history of CAD, urinary retention, with indwelling Foley catheter since August 2020 (follows with urologist Dr. Diamantina Providence), CAD with history of CABG more than 10 years ago, iron deficiency anemia, non-insulin-dependent diabetes mellitus type 2, hyperlipidemia CKD stage IV, who presented emergency department from home via EMS  on evening of 12/24/2021 with altered mental status, weakness, and fall.  Evaluation in the ED revealed a UTI, pt met criteria for sepsis with tachypnea, tachycardia and elevated lactic acid.  Pt also tested positive for Covid-19 but denied any GI or respiratory symptoms.  Foley was exchanged in the ED.  Patient was started on empiric IV Rocephin pending urine cultures.  IV fluids were given with sepsis and elevated CK, initially 602 >> 5867 the next morning.  Assessment and Plan * Complicated UTI (urinary tract infection)- (present on admission) POA, UA with large Hbg, small leuk's, few bacteria, wbc 11-20. Complicated in the setting of chronic indwelling Foley, high risk for infection. --Continue empiric Rocephin --Follow urine and blood cultures  Chronic indwelling Foley catheter Follows with urology for monthly exchanges.  Foley was replaced on admission in the ED on 1/15.  Sepsis ([place])- (present on admission) POA with tachypnea, tachycardia in setting of UTI and Covid-19 infections. Treated per sepsis protocol on admission. Sepsis physiology improved. --Continue antibiotics --Follow cultures --Monitor vitals & hemodynamics closely  COVID-19 virus detected- (present on admission) POA.  Pt minimally symptomatic, does have wet sounding cough, extremely weak with falls.   Son [facility]) agrees with treating.   CXR negative for PNA. Placed on 2 L/min [place] O2 but charted sats  95-100%. --Start remdesevir --Mucinex --Vitamin C, Zinc, MV --Defer steroids for now unless truly hypoxic with room air O2 sat < 90%    --Monitor inflammatory markers, CBC, CMP  Weakness Pt had two falls at home, due to profound weakness in setting of infections.  Ambulates with a walker.   --PT / OT evaluations --Fall precautions --Treat infections --Optimize nutrition  Acute metabolic encephalopathy- (present on admission) POA, due to infections with UTI and Covid-19.  Improving. --Delirium precautions --Treat underlying infections --Monitor and correct electrolytes  Rhabdomyolysis- (present on admission) POA.  Due to trauma with falls vs possibly Covid-19.   Initial CK 602 >> 5867 this AM. Treated with IV fluids per sepsis on admission.   --resume IV fluids --monitor CMP, CK daily  CKD (chronic kidney disease) stage 4, GFR 15-29 ml/min (HCC)- (present on admission) Renal function appears at baseline.  Cr on admission 2.36, similar to past several.  Heart failure with preserved ejection fraction ([place])- (present on admission) Stable, pt euvolemic to slightly dry. Echo 10/08/2021: EF 60 to 30%, grade 2 diastolic dysfunction. --Continue home Coreg --Home Lasix on hold giving IV fluids --Monitor volume status  Essential hypertension- (present on admission) BP stable, hypertensive today vs yesterday on admission. --Resume home Coreg and amlodipine 5 mg (for now, home dose 10 mg)  CAD (coronary artery disease)- (present on admission) Stable.  Hold Lipitor with elevated CK. Continue home Coreg 3.125 mg BID.  HLD (hyperlipidemia)- (present on admission) Hold Lipitor with rhabdomyolysis.  Iron deficiency anemia- (present on admission) Hbg stable.  Iron studies normal on 12/19/20.  Recheck with AM labs.  Rib fracture- (present on admission) CT on admission noted a posterior 12th LEFT rib fracture  of indeterminate age.  Possibly sustained in recent falls vs more remote.    --Supportive care --Pain control PRN --Incentive spirometrer  GERD (gastroesophageal reflux disease)- (present on admission) Continue PPI  Right inguinal hernia No acute issues. Monitor.   Secondary hyperparathyroidism of renal origin [facility])- (present on admission) Due to CKD.   Hypokalemia Replacing with IV fluid additive. K 3.3 today, 3.9 on admission yesterday.  Anemia in stage 4 chronic kidney disease ([place])- (present on admission) Hbg on admission 9.9.  Baseline in 2022 was 10.0-10.7.  Stable. --Monitor CBC  BPH (benign prostatic hyperplasia)- (present on admission) With chronic urinary retention and indwelling Foley since August 2020. Follows with Dr. Diamantina Providence. Foley replaced in ED 1/15  Diabetes ([place]) Uncontrolled.  Last A1c 9.7%. On glipizide 2 mg daily - hold. Sliding scale Novolog CBG's, hypoglycemia protocol      Subjective: Was resting in bed but awake when seen today, son at bedside.  Patient denies COVID symptoms including shortness of breath, sore throat, headaches or cough.  He did have a wet sounding cough I was in the room and son confirms recent cough.  We discussed patient's weakness could be related to both his UTI and COVID.  Son later in the day requested to treat with remdesivir.  Currently patient has no acute complaints.  Objective Vitals reviewed and notable for elevated blood pressure with systolic in the 444Q to 190V.  General exam: Resting but wakes easily, no acute distress, frail HEENT: moist mucus membranes, hearing grossly normal  Respiratory system: CTAB with referred upper airway sounds, no wheezes, rales or rhonchi, normal respiratory effort at rest, on 2 L/min [place] O2, charted O2 sats 95 to 100%. Cardiovascular system: normal S1/S2, RRR, no pedal edema.   Genitourinary system: Foley in place Central nervous system: no gross focal neurologic deficits, normal speech Extremities: moves all, no edema, no cyanosis Skin: dry, intact, normal  temperature, pale Psychiatry: normal mood, congruent affect   Data Reviewed:  Labs reviewed and notable for: Sodium 134, potassium 3.3, chloride 97, glucose 207, BUN 37, creatinine 2.36, calcium 8.8, CK increased to 5867 (from 602), procalcitonin 0.23, B12 elevated 1211 , hemoglobin 9.9.  Hemoglobin A1c 9.7% (average glucose 204).  Family Communication: Son at bedside on rounds today  Disposition: Status is: Inpatient  Remains inpatient appropriate because: Severity of illness on IV therapies as outlined above         Time spent: 40 minutes  Author: Ezekiel Slocumb, DO 12/25/2021 5:48 PM  For on call review [email or web address].

## 2021-12-25 NOTE — Assessment & Plan Note (Addendum)
-  Met sepsis criteria with increased heart rate, respiration rate, increased lactic acid, source of urine - Blood cultures x2, urine culture are in process - Continue ceftriaxone IV as above - Status post LR 1 L bolus, sodium chloride 500 mL bolus - Ordered additional sodium chloride 100 mL/h IVF to complete 500 mL bolus in setting of patient with chronic heart failure preserved ejection fraction

## 2021-12-26 ENCOUNTER — Inpatient Hospital Stay: Payer: Medicare HMO

## 2021-12-26 DIAGNOSIS — R7401 Elevation of levels of liver transaminase levels: Secondary | ICD-10-CM | POA: Diagnosis present

## 2021-12-26 DIAGNOSIS — J9601 Acute respiratory failure with hypoxia: Secondary | ICD-10-CM | POA: Diagnosis not present

## 2021-12-26 LAB — BLOOD GAS, ARTERIAL
Acid-Base Excess: 1.2 mmol/L (ref 0.0–2.0)
Bicarbonate: 25 mmol/L (ref 20.0–28.0)
FIO2: 80
O2 Saturation: 93.4 %
Patient temperature: 37
pCO2 arterial: 36 mmHg (ref 32.0–48.0)
pH, Arterial: 7.45 (ref 7.350–7.450)
pO2, Arterial: 65 mmHg — ABNORMAL LOW (ref 83.0–108.0)

## 2021-12-26 LAB — COMPREHENSIVE METABOLIC PANEL
ALT: 45 U/L — ABNORMAL HIGH (ref 0–44)
AST: 164 U/L — ABNORMAL HIGH (ref 15–41)
Albumin: 2.7 g/dL — ABNORMAL LOW (ref 3.5–5.0)
Alkaline Phosphatase: 84 U/L (ref 38–126)
Anion gap: 13 (ref 5–15)
BUN: 33 mg/dL — ABNORMAL HIGH (ref 8–23)
CO2: 23 mmol/L (ref 22–32)
Calcium: 8.2 mg/dL — ABNORMAL LOW (ref 8.9–10.3)
Chloride: 94 mmol/L — ABNORMAL LOW (ref 98–111)
Creatinine, Ser: 1.85 mg/dL — ABNORMAL HIGH (ref 0.61–1.24)
GFR, Estimated: 33 mL/min — ABNORMAL LOW (ref >60)
Glucose, Bld: 164 mg/dL — ABNORMAL HIGH (ref 70–99)
Potassium: 4.2 mmol/L (ref 3.5–5.1)
Sodium: 130 mmol/L — ABNORMAL LOW (ref 135–145)
Total Bilirubin: 0.4 mg/dL (ref 0.3–1.2)
Total Protein: 6.4 g/dL — ABNORMAL LOW (ref 6.5–8.1)

## 2021-12-26 LAB — URINE CULTURE
Culture: >=100000 — AB
Culture: >=100000 — AB

## 2021-12-26 LAB — PROCALCITONIN: Procalcitonin: 0.78 ng/mL

## 2021-12-26 LAB — CBC WITH DIFFERENTIAL/PLATELET
Abs Immature Granulocytes: 0.06 10*3/uL (ref 0.00–0.07)
Basophils Absolute: 0 10*3/uL (ref 0.0–0.1)
Basophils Relative: 0 %
Eosinophils Absolute: 0 10*3/uL (ref 0.0–0.5)
Eosinophils Relative: 0 %
HCT: 31.4 % — ABNORMAL LOW (ref 39.0–52.0)
Hemoglobin: 10.8 g/dL — ABNORMAL LOW (ref 13.0–17.0)
Immature Granulocytes: 1 %
Lymphocytes Relative: 10 %
Lymphs Abs: 1.1 10*3/uL (ref 0.7–4.0)
MCH: 27.6 pg (ref 26.0–34.0)
MCHC: 34.4 g/dL (ref 30.0–36.0)
MCV: 80.1 fL (ref 80.0–100.0)
Monocytes Absolute: 0.7 10*3/uL (ref 0.1–1.0)
Monocytes Relative: 7 %
Neutro Abs: 8.4 10*3/uL — ABNORMAL HIGH (ref 1.7–7.7)
Neutrophils Relative %: 82 %
Platelets: 150 10*3/uL (ref 150–400)
RBC: 3.92 MIL/uL — ABNORMAL LOW (ref 4.22–5.81)
RDW: 13.6 % (ref 11.5–15.5)
WBC: 10.2 10*3/uL (ref 4.0–10.5)
nRBC: 0 % (ref 0.0–0.2)

## 2021-12-26 LAB — GLUCOSE, CAPILLARY
Glucose-Capillary: 196 mg/dL — ABNORMAL HIGH (ref 70–99)
Glucose-Capillary: 316 mg/dL — ABNORMAL HIGH (ref 70–99)
Glucose-Capillary: 219 mg/dL — ABNORMAL HIGH (ref 70–99)
Glucose-Capillary: 239 mg/dL — ABNORMAL HIGH (ref 70–99)

## 2021-12-26 LAB — BRAIN NATRIURETIC PEPTIDE: B Natriuretic Peptide: 358 pg/mL — ABNORMAL HIGH (ref 0.0–100.0)

## 2021-12-26 LAB — C-REACTIVE PROTEIN: CRP: 10.9 mg/dL — ABNORMAL HIGH (ref <1.0)

## 2021-12-26 LAB — CK: Total CK: 7933 U/L — ABNORMAL HIGH (ref 49–397)

## 2021-12-26 LAB — OSMOLALITY: Osmolality: 288 mOsm/kg (ref 275–295)

## 2021-12-26 LAB — MAGNESIUM: Magnesium: 1.7 mg/dL (ref 1.7–2.4)

## 2021-12-26 MED ORDER — FUROSEMIDE 10 MG/ML IJ SOLN
20.0000 mg | Freq: Once | INTRAMUSCULAR | Status: AC
  Administered 2021-12-26: 02:00:00 20 mg via INTRAVENOUS
  Filled 2021-12-26: qty 2

## 2021-12-26 MED ORDER — LACTATED RINGERS IV SOLN: INTRAVENOUS | Status: DC

## 2021-12-26 MED ORDER — SODIUM CHLORIDE 0.9 % IV SOLN
3.0000 g | Freq: Two times a day (BID) | INTRAVENOUS | Status: AC
  Administered 2021-12-27 – 2021-12-31 (×10): 3 g via INTRAVENOUS
  Filled 2021-12-26 (×3): qty 3
  Filled 2021-12-26 (×2): qty 8
  Filled 2021-12-26 (×2): qty 3
  Filled 2021-12-26: qty 8
  Filled 2021-12-26: qty 3
  Filled 2021-12-26 (×2): qty 8

## 2021-12-26 MED ORDER — DEXAMETHASONE SODIUM PHOSPHATE 10 MG/ML IJ SOLN
6.0000 mg | Freq: Once | INTRAMUSCULAR | Status: AC
  Administered 2021-12-26: 6 mg via INTRAVENOUS
  Filled 2021-12-26: qty 1

## 2021-12-26 MED ORDER — METHYLPREDNISOLONE SODIUM SUCC 40 MG IJ SOLR
40.0000 mg | Freq: Two times a day (BID) | INTRAMUSCULAR | Status: DC
  Administered 2021-12-26 – 2021-12-27 (×4): 40 mg via INTRAVENOUS
  Filled 2021-12-26 (×4): qty 1

## 2021-12-26 NOTE — Progress Notes (Addendum)
° °      CROSS COVER NOTE  NAME: TRACEY STEWART MRN: [id number] DOB : 12/04/1924    Secure chat received from nursing reporting that patient was hypoxic to the 70s and being placed on nonrebreather 15 L and high flow nasal cannula.  On arrival to bedside patient was satting 77% and had just been transitioned to heated high flow 60L/100%.  On auscultation patient has fine crackles and poor air movement in the lower bases.  6 mg Decadron ordered, albuterol inhaler given, holding IV fluids for now.  Home Lasix has been held thus far, one dose of 20 mg IV ordered. Chest x-ray ordered.  ABG shows pH 7.45, PCO2 36, PO2 65, and bicarb 25.  Patient upgraded to progressive and transferred to 2A. Son notified by nursing staff.  Neomia Glass MHA, MSN, FNP-BC Nurse Practitioner [facility] Pager [phone]

## 2021-12-26 NOTE — Assessment & Plan Note (Addendum)
much as 60 L on heated high flow nasal cannula.   Likely due to COVID versus aspiration.  Seen to have difficulty swallowing coffee   -- SLP consult for swallow evaluation -- Continue steroids -- Continue Unasyn -- Pulmonary hygiene

## 2021-12-26 NOTE — Assessment & Plan Note (Addendum)
Na stable -Hold fluids

## 2021-12-26 NOTE — Progress Notes (Signed)
Patient transferred to Progressive room 38, son notified and made aware.

## 2021-12-26 NOTE — Assessment & Plan Note (Signed)
?  due to rhabdo.  Improving. Monitor closely

## 2021-12-26 NOTE — Progress Notes (Signed)
Called to patient's room secondary to drop in saturation and significant increased in O2 requirements. Upon entry into room, noticed saturation in the 70's. Placed patient on HFNC, 100% and 60L with little change in saturation. Will inform MD for further orders.

## 2021-12-26 NOTE — Evaluation (Signed)
Clinical/Bedside Swallow Evaluation Patient Details  Name: RHYSE LOUX MRN: [id number] Date of Birth: [birth year]  Today's Date: 12/26/2021 Time: SLP Start Time (ACUTE ONLY): 93 SLP Stop Time (ACUTE ONLY): 1045 SLP Time Calculation (min) (ACUTE ONLY): 15 min  Past Medical History:  Past Medical History:  Diagnosis Date   CAD (coronary artery disease)    CKD (chronic kidney disease) stage 4, GFR 15-29 ml/min (HCC)    Diabetes mellitus without complication (HCC)    GERD (gastroesophageal reflux disease)    Hyperlipidemia    Hypertension    Iron deficiency anemia 07/30/2017   Nephrolithiasis    Past Surgical History:  Past Surgical History:  Procedure Laterality Date   APPENDECTOMY     60 plus years ago   CORONARY ANGIOPLASTY WITH STENT PLACEMENT     CORONARY ARTERY BYPASS GRAFT     HPI:  88 year old male with history of CAD, urinary retention, with indwelling Foley catheter, CAD with history of CABG more than 10 years ago, iron deficiency anemia, non-insulin-dependent diabetes mellitus type 2, hyperlipidemia CKD stage IV, who presents emergency department from home via EMS for chief concerns of altered mental status, weakness, and fall. +UTI, +COVID. Overnight 1/17 patient found hypoxic and required increased O2 from initial 3L [place] to 100% HFNC    Assessment / Plan / Recommendation  Clinical Impression  Patient presents with s/s oropharyngeal dysphagia iso lethargic state: reduced mastication, oral residue requiring liquid wash and oral bolus holding with increased fatigue. No clinical s/s aspiration across x4oz thin liquids, 2oz puree, and x 2 moistened solids x3 hard dry solids. Patient is at elevated risk of aspiration iso advanced age, variable mental status/lethargy, and increased respiratory requirements. Recommend downgrade of diet to dysphagia 2 minced/chopped diet and thin liquids with total 100% supervision/assistance for PO intake. Only feed when awake, alert, and  accepting with strategies below. Close clinical monitor via team and low threshold to hold PO if concerns arise. SLP to follow. SLP Visit Diagnosis: Dysphagia, oropharyngeal phase (R13.12)    Aspiration Risk  Moderate aspiration risk;Risk for inadequate nutrition/hydration    Diet Recommendation   Dysphagia 2 (chopped)   Thin Liquids   100% total supervision/assistance  Strategies: Slow rate Small single bites/sips Upright 90 degrees midline, remain upright 30 min after meals Check oral cavity for clearance/pocketing  Medication Administration: Crushed with puree    Other  Recommendations Oral Care Recommendations: Oral care BID    Recommendations for follow up therapy are one component of a multi-disciplinary discharge planning process, led by the attending physician.  Recommendations may be updated based on patient status, additional functional criteria and insurance authorization.  Follow up Recommendations  (TBD with progression of care)      Assistance Recommended at Discharge Frequent or constant Supervision/Assistance  Functional Status Assessment Patient has had a recent decline in their functional status and/or demonstrates limited ability to make significant improvements in function in a reasonable and predictable amount of time  Frequency and Duration min 2x/week  1 week       Prognosis Prognosis for Safe Diet Advancement: Guarded Barriers to Reach Goals: Other (Comment) Barriers/Prognosis Comment: age, medical status      Swallow Study   General Date of Onset: 12/24/21 HPI: 88 year old male with history of CAD, urinary retention, with indwelling Foley catheter, CAD with history of CABG more than 10 years ago, iron deficiency anemia, non-insulin-dependent diabetes mellitus type 2, hyperlipidemia CKD stage IV, who presents emergency department from home via EMS for chief  concerns of altered mental status, weakness, and fall. +UTI, +COVID. Overnight 1/17 patient  found hypoxic and required increased O2 from initial 3L [place] to 100% HFNC Type of Study: Bedside Swallow Evaluation Previous Swallow Assessment: no prior SLP services Diet Prior to this Study: Regular Temperature Spikes Noted: Yes Respiratory Status: Nasal cannula (high flow) History of Recent Intubation: No Behavior/Cognition: Lethargic/Drowsy Oral Cavity Assessment: Within Functional Limits Oral Care Completed by SLP: Recent completion by staff Oral Cavity - Dentition: Dentures, top;Dentures, bottom Vision: Functional for self-feeding Self-Feeding Abilities: Needs assist Patient Positioning: Upright in bed Baseline Vocal Quality: Low vocal intensity Volitional Cough: Weak Volitional Swallow: Unable to elicit    Oral/Motor/Sensory Function Overall Oral Motor/Sensory Function: Within functional limits   Ice Chips     Thin Liquid Thin Liquid: Within functional limits Presentation: Straw Other Comments: oral bolus holding noted when fatigued    Nectar Thick Nectar Thick Liquid: Not tested   Honey Thick Honey Thick Liquid: Not tested   Puree Puree: Within functional limits   Solid     Solid: Impaired Presentation: Self Fed;Spoon Oral Phase Impairments: Reduced lingual movement/coordination;Impaired mastication Oral Phase Functional Implications: Impaired mastication;Oral residue     [place], M.A. CCC-SLP   [place] L Autry Droege 12/26/2021,12:08 PM

## 2021-12-26 NOTE — Progress Notes (Signed)
°   12/26/21 0116  Assess: MEWS Score  Temp (!) 102.5 F (39.2 C)  BP (!) 158/76  Pulse Rate 88  Resp 18  SpO2 (!) 73 %  O2 Device Nasal Cannula  O2 Flow Rate (L/min) 3 L/min  Assess: MEWS Score  MEWS Temp 2  MEWS Systolic 0  MEWS Pulse 0  MEWS RR 0  MEWS LOC 0  MEWS Score 2  MEWS Score Color Yellow  Assess: if the MEWS score is Yellow or Red  Were vital signs taken at a resting state? Yes  Focused Assessment No change from prior assessment  Does the patient meet 2 or more of the SIRS criteria? Yes  Does the patient have a confirmed or suspected source of infection? No  MEWS guidelines implemented *See Row Information* No, vital signs rechecked  Treat  MEWS Interventions Administered prn meds/treatments;Escalated (See documentation below);Consulted Respiratory Therapy  Pain Scale 0-10  Pain Score 0  Take Vital Signs  Increase Vital Sign Frequency  Yellow: Q 2hr X 2 then Q 4hr X 2, if remains yellow, continue Q 4hrs  Escalate  MEWS: Escalate Yellow: discuss with charge nurse/RN and consider discussing with provider and RRT  Notify: Charge Nurse/RN  Name of Charge Nurse/RN Notified Jackie, RN  Date Charge Nurse/RN Notified 12/26/21  Time Charge Nurse/RN Notified 0145  Notify: Provider  Provider Name/Title Neomia Glass  Date Provider Notified 12/26/21  Time Provider Notified [phone]  Notification Type Face-to-face (secure chat)  Notification Reason Change in status  Provider response At bedside;See new orders  Date of Provider Response 12/26/21  Document  Patient Outcome Transferred/level of care increased  Progress note created (see row info) Yes  Assess: SIRS CRITERIA  SIRS Temperature  1  SIRS Pulse 0  SIRS Respirations  0  SIRS WBC 0  SIRS Score Sum  1

## 2021-12-26 NOTE — Consult Note (Signed)
Pharmacy Antibiotic Note  Jay Mathis is a 88 y.o. male admitted on 12/24/2021 with sepsis and pneumonia.  Pharmacy has been consulted for Unasyn dosing. Patient received Ceftriaxone x 3 days for UTI.  Plan: Unasyn 3g IV Q12 hours(To start 12/27/2021)  Will continue to monitor renal function and adjust dose as necessary  Height: 6' (182.9 cm) Weight: 63 kg (138 lb 14.2 oz) IBW/kg (Calculated) : 77.6  Temp (24hrs), Avg:99.8 F (37.7 C), Min:98.3 F (36.8 C), Max:102.5 F (39.2 C)  Recent Labs  Lab 12/24/21 1828 12/24/21 2115 12/25/21 0041 12/26/21 0550  WBC 6.5  --  6.8 10.2  CREATININE 2.62*  --  2.36* 1.85*  LATICACIDVEN 2.8* 1.4  --   --     Estimated Creatinine Clearance: 21.3 mL/min (A) (by C-G formula based on SCr of 1.85 mg/dL (H)).    Allergies  Allergen Reactions   Sulfa Antibiotics Rash    Antimicrobials this admission: Ceftriaxone 1/15 >> 1/17 Unasyn 1/18 >>    Microbiology results: 1/15 BCx: NGTD 1/15 UCx: 100k Mult. Species   Thank you for allowing pharmacy to be a part of this patients care.  Gracy Ehly A Cherrie Franca 12/26/2021 2:11 PM

## 2021-12-26 NOTE — Progress Notes (Signed)
Pt transferred to room 258 as per orders

## 2021-12-26 NOTE — Progress Notes (Signed)
Progress Note   Patient: Jay Mathis QIH:[id number] DOB: [birth year] DOA: 12/24/2021     11 DOS: the patient was seen and examined on 01/18/2022   Brief hospital course: Jay Mathis is a 88 y.o. M w/ CAD, urinary retention with indwelling Foley catheter since August 2020 (follows with urologist Dr. Diamantina Providence), CAD s/p CABG, IDA, NIDDM, and CKD IV baseline Cr 1.8-2.8 who presented with altered mental status, weakness, and fall.  In the ER, patient was altered, with tachycardia, tachypnea, lactic acidosis and confusion.  COVID+.  Foley was exchanged in the ED.  Patient was started on empiric IV Rocephin pending urine cultures.  CK, initially 602 >> 5867 the next morning.  Assessment and Plan: * Acute respiratory failure with hypoxia [facility]) Patient admitted with COVID and bilateral infiltrates.  He did require high flow nasal cannula for about 24 hours, subsequently weaned to room air.   SLP ocnsulted, mostly cognitive based dysphagia, on minced diet until mental status improves.       Chronic indwelling Foley catheter Follows with urology for monthly exchanges.  Foley was replaced on admission in the ED on 1/15.  Pyuria- (present on admission) POA, UA with large Hbg Urine culture with more than 100,000 colonies multiple species, suggest recollection.  Suspect acute illness more related to Covid than UTI UTI ruled out  Sepsis ([place])- (present on admission) POA with tachypnea, tachycardia and decreased mentation confusion  Sepsis due to COVID  Viral sepsis  COVID-19 virus detected- (present on admission) Completed 5 days remdesivir and steroids, weaned off O2  Weakness-resolved as of 12/29/2021 Pt had two falls at home, due to profound weakness in setting of infections.  Ambulates with a walker.   --PT / OT evaluations --Fall precautions --Treat infections --Optimize nutrition  Acute metabolic encephalopathy- (present on admission) POA, at baseline, patient lives independently,  alone, no cognitive impairment.  Here he was confused, and disoriented. Seemed to be hallucinating during encounter this time.  Remains severely confused, disoriented, likely COVID encephalopathy. --Delirium precautions   Rhabdomyolysis- (present on admission) POA.  Due to trauma with falls vs possibly Covid-19.   Initial CK 602 >> 5867>> 7933 > 3000 Resolving, fluids stopped  CKD (chronic kidney disease) stage 4, GFR 15-29 ml/min (HCC)- (present on admission) Renal function appears at baseline.  Cr on admission 2.36, stable here  Chronic diastolic CHF (congestive heart failure) ([place])- (present on admission) Stable, pt euvolemic to slightly dry. Echo 10/08/2021: EF 60 to 87%, grade 2 diastolic dysfunction. --Continue home Coreg --Hold lasix  Essential hypertension- (present on admission) BP stable --Continue Coreg, amlodipine  CAD (coronary artery disease)- (present on admission) -Hold atorvastatin given Rhabdo -Continue Coreg   HLD (hyperlipidemia) Hold Lipitor with rhabdomyolysis.  Iron deficiency anemia- (present on admission) Hgb trending down  Iron studies normal on 12/19/20.     Rib fracture- (present on admission) Incidental on admission CT, age indeterminate, asymptomatic here  GERD (gastroesophageal reflux disease) Continue PPI  Right inguinal hernia No acute issues. Monitor.   Secondary hyperparathyroidism of renal origin [facility])- (present on admission) Due to CKD.   Aspiration pneumonia (HCC) -Continue unasyn, day 5 of 5  Hypokalemia Resolved with replacement.     Hyponatremia Na stable -Hold fluids  Anemia in stage 4 chronic kidney disease ([place])- (present on admission) [place] 10, trending down here.  Iron replete earlier this month - Check CBC tomorrow    BPH (benign prostatic hyperplasia)- (present on admission) With chronic urinary retention and indwelling Foley since August 2020.  Follows with Dr. Diamantina Providence. Foley replaced in ED  1/15  Type 2 diabetes mellitus with stage 4 chronic kidney disease and hyperglycemia Uncontrolled.  Last A1c 9.7%. Glucose improved - Hold glipizide - Continue Sliding scale Novolog - Continue levemir - Continue linagliptin given COVID, d/c on discharge    Transaminitis-resolved as of 12/29/2021, (present on admission) ?due to rhabdo.  Improving. Monitor closely         Subjective: pt denies acute complaints.  Reports feeling fair overall. No acute events reported.  Physical Exam: Vitals:   01/05/22 0349 01/05/22 0810 01/05/22 0900 01/05/22 1410  BP: (!) 152/67 (!) 155/64 (!) 141/64 (!) 170/73  Pulse: 93 89 88 92  Resp: 20 20 18 19   Temp: 97.8 F (36.6 C) 97.9 F (36.6 C) 98.1 F (36.7 C) 98.6 F (37 C)  TempSrc:  Oral  Oral  SpO2: 94% 95% 98% 99%  Weight:      Height:       General exam: awake, alert, no acute distress Respiratory system: CTAB, no wheezes, rales or rhonchi, normal respiratory effort. Cardiovascular system: normal S1/S2,  RRR, no pedal edema.   Gastrointestinal system: soft, NT, ND, no HSM felt, +bowel sounds. Central nervous system: no gross focal neurologic deficits, normal speech Extremities: moves all , no edema, normal tone Skin: dry, intact, normal temperature Psychiatry: normal mood, congruent affect  Data Reviewed:  Labs notable for Na 130, Cl 94, glucose 164, BUN 33, Cr 1.85, CK 7933, BNP 358, Hbg 10.8, CRP 10.9   Family Communication: son at bedside on rounds  Disposition: Inpatient appropriate due to severity of illness, on IV therapies   Planned Discharge Destination: Skilled nursing facility     Time spent: 35 minutes  Author: Ezekiel Slocumb, DO 01/18/2022 3:37 PM  For on call review [email or web address].

## 2021-12-27 ENCOUNTER — Inpatient Hospital Stay
Admit: 2021-12-27 | Discharge: 2021-12-27 | Disposition: A | Discharge status: Home / Self Care | Payer: Medicare HMO | Attending: Internal Medicine | Admitting: Internal Medicine

## 2021-12-27 ENCOUNTER — Encounter: Payer: Self-pay | Admitting: Internal Medicine

## 2021-12-27 DIAGNOSIS — I2510 Atherosclerotic heart disease of native coronary artery without angina pectoris: Secondary | ICD-10-CM

## 2021-12-27 DIAGNOSIS — U071 COVID-19: Secondary | ICD-10-CM

## 2021-12-27 DIAGNOSIS — Z515 Encounter for palliative care: Secondary | ICD-10-CM

## 2021-12-27 DIAGNOSIS — N184 Chronic kidney disease, stage 4 (severe): Secondary | ICD-10-CM

## 2021-12-27 DIAGNOSIS — R531 Weakness: Secondary | ICD-10-CM

## 2021-12-27 DIAGNOSIS — Z978 Presence of other specified devices: Secondary | ICD-10-CM

## 2021-12-27 DIAGNOSIS — Z66 Do not resuscitate: Secondary | ICD-10-CM

## 2021-12-27 DIAGNOSIS — J9601 Acute respiratory failure with hypoxia: Secondary | ICD-10-CM

## 2021-12-27 DIAGNOSIS — I5030 Unspecified diastolic (congestive) heart failure: Secondary | ICD-10-CM

## 2021-12-27 LAB — CBC WITH DIFFERENTIAL/PLATELET
Abs Immature Granulocytes: 0.16 10*3/uL — ABNORMAL HIGH (ref 0.00–0.07)
Basophils Absolute: 0 10*3/uL (ref 0.0–0.1)
Basophils Relative: 0 %
Eosinophils Absolute: 0 10*3/uL (ref 0.0–0.5)
Eosinophils Relative: 0 %
HCT: 30.8 % — ABNORMAL LOW (ref 39.0–52.0)
Hemoglobin: 10.5 g/dL — ABNORMAL LOW (ref 13.0–17.0)
Immature Granulocytes: 1 %
Lymphocytes Relative: 7 %
Lymphs Abs: 1.1 10*3/uL (ref 0.7–4.0)
MCH: 26.9 pg (ref 26.0–34.0)
MCHC: 34.1 g/dL (ref 30.0–36.0)
MCV: 78.8 fL — ABNORMAL LOW (ref 80.0–100.0)
Monocytes Absolute: 0.5 10*3/uL (ref 0.1–1.0)
Monocytes Relative: 3 %
Neutro Abs: 14 10*3/uL — ABNORMAL HIGH (ref 1.7–7.7)
Neutrophils Relative %: 89 %
Platelets: 149 10*3/uL — ABNORMAL LOW (ref 150–400)
RBC: 3.91 MIL/uL — ABNORMAL LOW (ref 4.22–5.81)
RDW: 14.1 % (ref 11.5–15.5)
WBC: 15.8 10*3/uL — ABNORMAL HIGH (ref 4.0–10.5)
nRBC: 0 % (ref 0.0–0.2)

## 2021-12-27 LAB — COMPREHENSIVE METABOLIC PANEL
ALT: 41 U/L (ref 0–44)
AST: 109 U/L — ABNORMAL HIGH (ref 15–41)
Albumin: 2.7 g/dL — ABNORMAL LOW (ref 3.5–5.0)
Alkaline Phosphatase: 69 U/L (ref 38–126)
Anion gap: 11 (ref 5–15)
BUN: 60 mg/dL — ABNORMAL HIGH (ref 8–23)
CO2: 25 mmol/L (ref 22–32)
Calcium: 8.2 mg/dL — ABNORMAL LOW (ref 8.9–10.3)
Chloride: 91 mmol/L — ABNORMAL LOW (ref 98–111)
Creatinine, Ser: 2.4 mg/dL — ABNORMAL HIGH (ref 0.61–1.24)
GFR, Estimated: 24 mL/min — ABNORMAL LOW (ref >60)
Glucose, Bld: 225 mg/dL — ABNORMAL HIGH (ref 70–99)
Potassium: 4.9 mmol/L (ref 3.5–5.1)
Sodium: 127 mmol/L — ABNORMAL LOW (ref 135–145)
Total Bilirubin: 0.5 mg/dL (ref 0.3–1.2)
Total Protein: 5.8 g/dL — ABNORMAL LOW (ref 6.5–8.1)

## 2021-12-27 LAB — ECHOCARDIOGRAM COMPLETE
AR max vel: 0.8 cm2
AV Area VTI: 0.8 cm2
AV Area mean vel: 0.75 cm2
AV Mean grad: 23 mmHg
AV Peak grad: 42.8 mmHg
Ao pk vel: 3.27 m/s
Area-P 1/2: 6.83 cm2
Height: 72 in
MV VTI: 2.47 cm2
S' Lateral: 3.01 cm
Weight: 2222.24 oz

## 2021-12-27 LAB — MAGNESIUM: Magnesium: 1.7 mg/dL (ref 1.7–2.4)

## 2021-12-27 LAB — GLUCOSE, CAPILLARY
Glucose-Capillary: 225 mg/dL — ABNORMAL HIGH (ref 70–99)
Glucose-Capillary: 385 mg/dL — ABNORMAL HIGH (ref 70–99)
Glucose-Capillary: 299 mg/dL — ABNORMAL HIGH (ref 70–99)
Glucose-Capillary: 233 mg/dL — ABNORMAL HIGH (ref 70–99)

## 2021-12-27 LAB — CK: Total CK: 3166 U/L — ABNORMAL HIGH (ref 49–397)

## 2021-12-27 LAB — C-REACTIVE PROTEIN: CRP: 14 mg/dL — ABNORMAL HIGH (ref <1.0)

## 2021-12-27 LAB — PHOSPHORUS: Phosphorus: 5.7 mg/dL — ABNORMAL HIGH (ref 2.5–4.6)

## 2021-12-27 NOTE — Progress Notes (Signed)
SLP Cancellation Note  Patient Details Name: Jay Mathis MRN: [id number] DOB: [birth year]   Cancelled treatment:       Reason Eval/Treat Not Completed:  (chart reviewed; consulted NSG re: pt's status today.). Noted Palliative Care note/meeting today w/ pt. NSG reported pt w/ good appetite today and swallowing pills Crushed in puree appropriately. Any coughing does not appear related to swallowing; baseline COVID19++.  ST services will continue to follow next 1-2 days for any needs.      Orinda Kenner, MS, CCC-SLP Speech Language Pathologist Rehab Services; [place] [phone] (ascom) Jazz Biddy 12/27/2021, 4:47 PM

## 2021-12-27 NOTE — Progress Notes (Addendum)
Inpatient Diabetes Program Recommendations  AACE/ADA: New Consensus Statement on Inpatient Glycemic Control   Target Ranges:  Prepandial:   less than 140 mg/dL      Peak postprandial:   less than 180 mg/dL (1-2 hours)      Critically ill patients:  140 - 180 mg/dL    Latest Reference Range & Units 12/27/21 08:29  Glucose-Capillary 70 - 99 mg/dL 225 (H)    Latest Reference Range & Units 12/26/21 09:27 12/26/21 12:35 12/26/21 18:25 12/26/21 20:49  Glucose-Capillary 70 - 99 mg/dL 196 (H) 316 (H) 219 (H) 239 (H)   Review of Glycemic Control  Diabetes history: DM2 Outpatient Diabetes medications: Amaryl 2 mg daily Current orders for Inpatient glycemic control: Novolog 0-9 units TID with meals, Novolog 0-5 units QHS; Solumedrol 40 mg Q12H  Inpatient Diabetes Program Recommendations:    Insulin: If steroids are continued as ordered, please consider ordering Levemir 5 units Q24H.  Modena Nunnery, RN, MSN, CDE Diabetes Coordinator Inpatient Diabetes Program [phone] (Team Pager from 8am to 5pm)

## 2021-12-27 NOTE — Progress Notes (Signed)
*  PRELIMINARY RESULTS* Echocardiogram 2D Echocardiogram has been performed.  Jay Mathis 12/27/2021, 10:08 AM

## 2021-12-27 NOTE — Evaluation (Signed)
Occupational Therapy Evaluation Patient Details Name: Jay Mathis MRN: [id number] DOB: 1924-06-26 Today's Date: 12/27/2021   History of Present Illness 88 year old male with history of CAD, urinary retention, with indwelling Foley catheter since August 2020 (follows with urologist Dr. Diamantina Providence), CAD with history of CABG more than 10 years ago, iron deficiency anemia, non-insulin-dependent diabetes mellitus type 2, hyperlipidemia CKD stage IV, who presented emergency department from home via EMS  on evening of 12/24/2021 with altered mental status, weakness, and fall.     Evaluation in the ED revealed a UTI, pt met criteria for sepsis with tachypnea, tachycardia and elevated lactic acid.  Pt also tested positive for Covid-19   Clinical Impression   Jay Mathis presents with generalized weakness, limited endurance, impaired balance, and SOB. Prior to admission, he has been living alone in a single-store home, using a RW for ambulation within his household, and with his two sons coming by frequently. One son stops by several times a day for short periods of time, one son spend evenings (6-10 PM) with pt, preparing his dinner and helping him get ready for bed. Per son Rodney Langton performs bed mobility, transfers, dressing, toileting, food prep during the day. During today's evaluation, Jay Mathis appears alert and oriented and does not display any signs of pain; however, therapist is unable to confirm this verbally, given pt's extreme HOH. Pt requires Mod A for standing and ambulation with RW, is heavily reliant on UE support while displaying fair standing balance. O2 sats in upper 90s while seated, dropping into low 80s/upper 70s with standing activity. Recommend ongoing OT while hospitalized. Given pt's significant decline in fxl mobility, weakness, high risk of falls, and limited caregiver support, recommend DC to SNF.    Recommendations for follow up therapy are one component of a multi-disciplinary  discharge planning process, led by the attending physician.  Recommendations may be updated based on patient status, additional functional criteria and insurance authorization.   Follow Up Recommendations  Skilled nursing-short term rehab (<3 hours/day)    Assistance Recommended at Discharge Frequent or constant Supervision/Assistance  Patient can return home with the following A lot of help with walking and/or transfers;A lot of help with bathing/dressing/bathroom;Assistance with cooking/housework;Assist for transportation;Direct supervision/assist for medications management    Functional Status Assessment  Patient has had a recent decline in their functional status and demonstrates the ability to make significant improvements in function in a reasonable and predictable amount of time.  Equipment Recommendations  None recommended by OT    Recommendations for Other Services       Precautions / Restrictions Precautions Precautions: Fall Precaution Comments: airborne precautions Restrictions Weight Bearing Restrictions: No      Mobility Bed Mobility               General bed mobility comments: Pt received, left in recliner    Transfers Overall transfer level: Needs assistance Equipment used: Rolling walker (2 wheels) Transfers: Sit to/from Stand Sit to Stand: Mod assist           General transfer comment: sharp drop in O2 sats with sit<>stand      Balance Overall balance assessment: Needs assistance Sitting-balance support: Feet supported, Bilateral upper extremity supported Sitting balance-Leahy Scale: Good     Standing balance support: Bilateral upper extremity supported, Reliant on assistive device for balance Standing balance-Leahy Scale: Fair  ADL either performed or assessed with clinical judgement   ADL Overall ADL's : Needs assistance/impaired                                       General  ADL Comments: Anticipate pt would require Mod A for all ADLs except self-feeding     Vision Patient Visual Report: No change from baseline       Perception     Praxis      Pertinent Vitals/Pain Pain Assessment Pain Assessment: PAINAD Breathing: normal Negative Vocalization: none Facial Expression: smiling or inexpressive Body Language: relaxed Consolability: no need to console PAINAD Score: 0     Hand Dominance Right   Extremity/Trunk Assessment Upper Extremity Assessment Upper Extremity Assessment: Overall WFL for tasks assessed   Lower Extremity Assessment Lower Extremity Assessment: Overall WFL for tasks assessed   Cervical / Trunk Assessment Cervical / Trunk Assessment: Normal   Communication Communication Communication: HOH   Cognition Arousal/Alertness: Awake/alert Behavior During Therapy: WFL for tasks assessed/performed Overall Cognitive Status: Difficult to assess                                 General Comments: appears alert and oriented, but challenging for therapist and pt to communicate, given pt's advanced hearing loss     General Comments       Exercises Other Exercises Other Exercises: Educ with caregivers re: role of OT, DC options   Shoulder Instructions      Home Living Family/patient expects to be discharged to:: Private residence Living Arrangements: Alone Available Help at Discharge: Friend(s);Available PRN/intermittently Type of Home: House Home Access: Stairs to enter Entrance Stairs-Number of Steps: 1 + 1   Home Layout: One level     Bathroom Shower/Tub: Occupational psychologist: Standard Bathroom Accessibility: No   Home Equipment: Conservation officer, nature (2 wheels)          Prior Functioning/Environment Prior Level of Function : Needs assist             Mobility Comments: Pt uses RW within the home ADLs Comments: Sons provide assistance with shopping, cooking, transport and other IADL. Pt is  home alone majority of the day.        OT Problem List: Decreased strength;Impaired balance (sitting and/or standing);Decreased activity tolerance;Cardiopulmonary status limiting activity;Decreased safety awareness      OT Treatment/Interventions: Self-care/ADL training;DME and/or AE instruction;Therapeutic activities;Balance training;Therapeutic exercise;Energy conservation;Patient/family education    OT Goals(Current goals can be found in the care plan section) Acute Rehab OT Goals Patient Stated Goal: for pt to be safe and comfortable OT Goal Formulation: With family Time For Goal Achievement: 01/10/22 Potential to Achieve Goals: Good ADL Goals Pt Will Perform Grooming: standing;with supervision Pt Will Perform Lower Body Dressing: with supervision;sitting/lateral leans;sit to/from stand Pt Will Transfer to Toilet: stand pivot transfer;regular height toilet;with supervision (using LRAD)  OT Frequency: Min 2X/week    Co-evaluation              AM-PAC OT "6 Clicks" Daily Activity     Outcome Measure Help from another person eating meals?: None Help from another person taking care of personal grooming?: A Little Help from another person toileting, which includes using toliet, bedpan, or urinal?: A Lot Help from another person bathing (including washing, rinsing, drying)?: A Lot Help from  another person to put on and taking off regular upper body clothing?: A Little Help from another person to put on and taking off regular lower body clothing?: A Lot 6 Click Score: 16   End of Session Equipment Utilized During Treatment: Rolling walker (2 wheels)  Activity Tolerance: Patient tolerated treatment well;Other (comment) (Pt limited by sharp drop in O2 sats with activity) Patient left: in chair;with call bell/phone within reach;with chair alarm set  OT Visit Diagnosis: Unsteadiness on feet (R26.81);Muscle weakness (generalized) (M62.81);History of falling (Z91.81)                 Time: 1017-5102 OT Time Calculation (min): 13 min Charges:  OT General Charges $OT Visit: 1 Visit OT Evaluation $OT Eval Moderate Complexity: 1 Mod OT Treatments $Self Care/Home Management : 8-22 mins Josiah Lobo, PhD, MS, OTR/L 12/27/21, 10:43 AM

## 2021-12-27 NOTE — Evaluation (Signed)
Physical Therapy Evaluation Patient Details Name: Jay Mathis MRN: [id number] DOB: 13-May-1924 Today's Date: 12/27/2021  History of Present Illness  88 year old male with history of CAD, urinary retention, with indwelling Foley catheter since August 2020 (follows with urologist Dr. Diamantina Providence), CAD with history of CABG more than 10 years ago, iron deficiency anemia, non-insulin-dependent diabetes mellitus type 2, hyperlipidemia CKD stage IV, who presented emergency department from home via EMS  on evening of 12/24/2021 with altered mental status, weakness, and fall.     Evaluation in the ED revealed a UTI, pt met criteria for sepsis with tachypnea, tachycardia and elevated lactic acid.  Pt also tested positive for Covid-19  Clinical Impression  Pt pleasant and motivated t/o PT exam and was able to do some limited ambulation with close assist.  He is apparently relatively independent at home with sons checking in QD but he is clearly not at this functional level at this time and will need to d/c to rehab facility to work back to a more functional level.  Overall good effort but quick to fatigue and far from baseline.     Recommendations for follow up therapy are one component of a multi-disciplinary discharge planning process, led by the attending physician.  Recommendations may be updated based on patient status, additional functional criteria and insurance authorization.  Follow Up Recommendations Skilled nursing-short term rehab (<3 hours/day)    Assistance Recommended at Discharge Frequent or constant Supervision/Assistance  Patient can return home with the following  A little help with walking and/or transfers;A little help with bathing/dressing/bathroom;Direct supervision/assist for medications management;Direct supervision/assist for financial management;Help with stairs or ramp for entrance;Assistance with cooking/housework    Equipment Recommendations Rolling walker (2 wheels) (has  rollator, would benefit from [place] if he does not have one currently)  Recommendations for Other Services       Functional Status Assessment Patient has had a recent decline in their functional status and demonstrates the ability to make significant improvements in function in a reasonable and predictable amount of time.     Precautions / Restrictions Precautions Precautions: Fall Precaution Comments: airborne precautions Restrictions Weight Bearing Restrictions: No      Mobility  Bed Mobility Overal bed mobility: Needs Assistance Bed Mobility: Sidelying to Sit   Sidelying to sit: Min assist            Transfers Overall transfer level: Needs assistance Equipment used: Rolling walker (2 wheels) Transfers: Sit to/from Stand Sit to Stand: Mod assist           General transfer comment: Pt unable to intiate lifting hips from EOB against gravity despite great effort but too weak to rise w/o assist    Ambulation/Gait Ambulation/Gait assistance: Min guard Gait Distance (Feet): 20 Feet Assistive device: Rolling walker (2 wheels)         General Gait Details: Pt initially very unsteadiness and struggled to clear feet during steppage, however he did show increased confidence and cadence (though still very slow and walker reliant).  O2 in the low 90s at beginning of ambulation effort, dropped to mid 80s with SOB and subjective fatigue  Stairs            Wheelchair Mobility    Modified Rankin (Stroke Patients Only)       Balance Overall balance assessment: Needs assistance Sitting-balance support: Feet supported, Bilateral upper extremity supported Sitting balance-Leahy Scale: Good Sitting balance - Comments: once assisted to sitting EOB he was able to maintain balance w/o assist  Standing balance support: Bilateral upper extremity supported, Reliant on assistive device for balance Standing balance-Leahy Scale: Fair Standing balance comment: Pt reliant on the  walker, quick to fatigue, no overt LOBs but guarded t/o the standing effort                             Pertinent Vitals/Pain Pain Assessment Pain Assessment: No/denies pain    Home Living Family/patient expects to be discharged to:: Private residence Living Arrangements: Alone Available Help at Discharge: Friend(s);Available PRN/intermittently Type of Home: House Home Access: Stairs to enter   Entrance Stairs-Number of Steps: 1 + 1   Home Layout: One level Home Equipment: Conservation officer, nature (2 wheels)      Prior Function Prior Level of Function : Needs assist             Mobility Comments: Pt uses RW within the home ADLs Comments: Sons provide assistance with shopping, cooking, transport and other IADL. Pt is home alone majority of the day.     Hand Dominance   Dominant Hand: Right    Extremity/Trunk Assessment   Upper Extremity Assessment Upper Extremity Assessment: Generalized weakness (age appropriate defecits)    Lower Extremity Assessment Lower Extremity Assessment: Generalized weakness (age appropraite defecits)    Cervical / Trunk Assessment Cervical / Trunk Assessment: Normal  Communication   Communication: HOH  Cognition Arousal/Alertness: Awake/alert Behavior During Therapy: WFL for tasks assessed/performed Overall Cognitive Status: Difficult to assess                                 General Comments: appears alert and oriented, but challenging for therapist and pt to communicate, given pt's advanced hearing loss        General Comments General comments (skin integrity, edema, etc.): Pt eager and willing to work with PT, communication difficult 2/2 HOH    Exercises     Assessment/Plan    PT Assessment Patient needs continued PT services  PT Problem List Decreased strength;Decreased activity tolerance;Decreased balance;Decreased mobility;Decreased knowledge of use of DME;Decreased safety awareness;Cardiopulmonary  status limiting activity;Decreased range of motion       PT Treatment Interventions DME instruction;Gait training;Stair training;Therapeutic activities;Functional mobility training;Therapeutic exercise;Balance training;Neuromuscular re-education;Patient/family education    PT Goals (Current goals can be found in the Care Plan section)  Acute Rehab PT Goals Patient Stated Goal: get back to living at home alone PT Goal Formulation: With patient Time For Goal Achievement: 01/10/22 Potential to Achieve Goals: Fair    Frequency Min 2X/week     Co-evaluation               AM-PAC PT "6 Clicks" Mobility  Outcome Measure Help needed turning from your back to your side while in a flat bed without using bedrails?: A Little Help needed moving from lying on your back to sitting on the side of a flat bed without using bedrails?: A Little Help needed moving to and from a bed to a chair (including a wheelchair)?: A Lot Help needed standing up from a chair using your arms (e.g., wheelchair or bedside chair)?: A Lot Help needed to walk in hospital room?: A Lot Help needed climbing 3-5 steps with a railing? : Total 6 Click Score: 13    End of Session Equipment Utilized During Treatment: Oxygen (2L) Activity Tolerance: Patient limited by fatigue;Patient tolerated treatment well Patient left: with chair alarm  set;with call bell/phone within reach Nurse Communication: Mobility status PT Visit Diagnosis: Muscle weakness (generalized) (M62.81);Difficulty in walking, not elsewhere classified (R26.2);Unsteadiness on feet (R26.81)    Time: 0822-0850 PT Time Calculation (min) (ACUTE ONLY): 28 min   Charges:   PT Evaluation $PT Eval Low Complexity: 1 Low PT Treatments $Gait Training: 8-22 mins        Kreg Shropshire, DPT 12/27/2021, 11:38 AM

## 2021-12-27 NOTE — TOC Initial Note (Signed)
Transition of Care [facility]) - Initial/Assessment Note    Patient Details  Name: Jay Mathis MRN: [id number] Date of Birth: May 07, 1924  [facility]) CM/SW Contact:    Alberteen Sam, LCSW Phone Number: 12/27/2021, 12:55 PM  Clinical Narrative:                  CSW spoke with patient's POA and son Fritz Pickerel regarding PT recs for SNF.   Fritz Pickerel reports he would like to discuss SNF rec with his brother, and let me know once they decide in 1-2  days.   Expected Discharge Plan:  (TBD) Barriers to Discharge: Continued Medical Work up   Patient Goals and CMS Choice Patient states their goals for this hospitalization and ongoing recovery are:: to go home CMS Medicare.gov Compare Post Acute Care list provided to:: Patient Represenative (must comment) (son [name]) Choice offered to / list presented to : Adult Children  Expected Discharge Plan and Services Expected Discharge Plan:  (TBD)   Discharge Planning Services: CM Consult Post Acute Care Choice: NA Living arrangements for the past 2 months: Single Family Home                                      Prior Living Arrangements/Services Living arrangements for the past 2 months: Single Family Home Lives with:: Self Patient language and need for interpreter reviewed::  (Spoke to patient's son) Do you feel safe going back to the place where you live?: Yes (yes)      Need for Family Participation in Patient Care: Yes (Comment) Care giver support system in place?: Yes (comment) Current home services: Homehealth aide Criminal Activity/Legal Involvement Pertinent to Current Situation/Hospitalization: No - Comment as needed  Activities of Daily Living Home Assistive Devices/Equipment: Walker (specify type) ADL Screening (condition at time of admission) Patient's cognitive ability adequate to safely complete daily activities?: No Is the patient deaf or have difficulty hearing?: Yes Does the patient have difficulty seeing,  even when wearing glasses/contacts?: No Does the patient have difficulty concentrating, remembering, or making decisions?: No Patient able to express need for assistance with ADLs?: Yes Does the patient have difficulty dressing or bathing?: Yes Independently performs ADLs?: No Communication: Independent Dressing (OT): Independent Grooming: Independent Feeding: Independent Bathing: Needs assistance Is this a change from baseline?: Change from baseline, expected to last >3 days Toileting: Needs assistance Is this a change from baseline?: Change from baseline, expected to last >3days In/Out Bed: Needs assistance Is this a change from baseline?: Change from baseline, expected to last >3 days Walks in Home: Needs assistance Is this a change from baseline?: Change from baseline, expected to last >3 days Does the patient have difficulty walking or climbing stairs?: Yes Weakness of Legs: Both Weakness of Arms/Hands: None  Permission Sought/Granted Permission sought to share information with : Case Manager Permission granted to share information with : Yes, Verbal Permission Granted              Emotional Assessment         Alcohol / Substance Use: Not Applicable Psych Involvement: No (comment)  Admission diagnosis:  Altered mental status [R41.82] Weakness [R53.1] Urinary tract infection without hematuria, site unspecified [N39.0] Sepsis, due to unspecified organism, unspecified whether acute organ dysfunction present ([place]) [A41.9] COVID-19 [U07.1] Urinary tract infection [N39.0] Patient Active Problem List   Diagnosis Date Noted   Transaminitis 12/26/2021   Acute  respiratory failure with hypoxia ([place]) 12/26/2021   Urinary tract infection 12/25/2021   Hypokalemia 31/59/4585   Acute metabolic encephalopathy 92/92/4462   Rhabdomyolysis 12/24/2021   Heart failure with preserved ejection fraction ([place]) 12/24/2021   Chronic indwelling Foley catheter 12/24/2021   Weakness  12/24/2021   COVID-19 virus detected 12/24/2021   Rib fracture 12/24/2021   Right inguinal hernia 12/24/2021   Leg swelling 08/14/2021   Cellulitis of left lower extremity 07/19/2021   Cellulitis and abscess of foot, except toes 09/21/2020   Essential hypertension 08/12/2020   Benign hypertensive kidney disease with chronic kidney disease 02/10/2020   Hematuria 02/10/2020   Hyperkalemia 02/10/2020   Hyponatremia 86/38/1771   Metabolic acidosis 16/57/9038   Proteinuria 02/10/2020   Secondary hyperparathyroidism of renal origin ([place]) 02/10/2020   UTI (urinary tract infection) 08/07/2019   Encounter for screening for depression 10/01/2018   CKD (chronic kidney disease) stage 4, GFR 15-29 ml/min (HCC) 03/27/2018   Anemia in stage 4 chronic kidney disease ([place]) 03/27/2018   Iron deficiency anemia 07/30/2017   CAD (coronary artery disease) 07/22/2017   BPH (benign prostatic hyperplasia) 07/22/2017   Sepsis ([place]) 33/38/3291   Complicated UTI (urinary tract infection) 08/04/2016   HLD (hyperlipidemia) 08/04/2016   Diabetes ([place]) 08/04/2016   GERD (gastroesophageal reflux disease) 08/04/2016   Acute on chronic kidney failure ([place]) 08/04/2016   PCP:  [name], MD Pharmacy:   [id number] - [place] [place] [address] [place] [address] Phone: [phone] Fax: [phone]     Social Determinants of Health (SDOH) Interventions    Readmission Risk Interventions No flowsheet data found.

## 2021-12-27 NOTE — Consult Note (Signed)
Consultation Note Date: 12/27/2021   Patient Name: Jay Mathis  DOB: 1924/06/10  MRN: [id number]  Age / Sex: 88 y.o., male  PCP: [name], MD Referring Physician: Kayleen Memos, DO  Reason for Consultation: Establishing goals of care  HPI/Patient Profile: 88 y.o. male  with past medical history of CAD with history of CABG (approximately 10 years ago), urinary retention with chronic Foley (since August 2020), iron deficiency anemia, diabetes type 2 (non-insulin-dependent) HLD, CKD (stage IV), HTN, GERD, right inguinal hernia, BPH, and recent falls (ambulates with walker at baseline) admitted on 12/24/2021 with altered mental status, weakness, and multiple falls prior to admission at home.  Patient found to be COVID-positive and positive for UTI.  CT reveals rib fracture present on admission.   Clinical Assessment and Goals of Care: I have reviewed medical records including EPIC notes, labs and imaging, assessed the patient and then met with patient at bedside to discuss diagnosis prognosis, GOC, EOL wishes, disposition and options.  I introduced Palliative Medicine as specialized medical care for people living with serious illness. It focuses on providing relief from the symptoms and stress of a serious illness. The goal is to improve quality of life for both the patient and the family.  Patient shares he is happy to talk with me and also wants me to speak with his son [name].  We discussed a brief life review of the patient.  Patient recalls many stories about his wife.  He shares that he has 2 sons and that if they do not come and visit them he will "spank them".    As far as functional and nutritional status patient reports he was independent at home and used a walker for stability.  He endorses prior to admission his legs were weak and that he has fallen a few times at home.  He also reports he did not  eat large amounts of food PTA but enjoys small bites consistently throughout the day.  We discussed patient's current illness and what it means in the larger context of patient's on-going co-morbidities. I attempted to elicit values and goals of care important to the patient.  Patient shared she just wants to get better.  He speaks of his family fondly and recalls several stories about his children and grandchildren.  Family has significant importance to this patient.  Family is facing treatment option decisions, advanced directive, and anticipatory care needs.  PT and OT recommends skilled nursing facility for rehab.  Patient was agreeable to this but again reiterated that he wants everything to be discussed with his sons [name] and Fritz Pickerel.   After meeting with patient I spoke to patient's son [name] over the phone.  [name] is not feeling well and said it would be better for [place] to speak tomorrow.  Shared I would touch base with [name] tomorrow to further discuss patient's goals of care.  Discussed with patient/family the importance of continued conversation with family and the medical providers regarding overall plan of care and treatment options,  ensuring decisions are within the context of the patients values and GOCs.    Questions and concerns were addressed. The family was encouraged to call with questions or concerns.   Primary Decision Maker PATIENT  Code Status/Advance Care Planning: DNR  Prognosis:   Unable to determine  Discharge Planning: [place] for rehab with Palliative care service follow-up  Primary Diagnoses: Present on Admission:  Acute metabolic encephalopathy  CAD (coronary artery disease)  Complicated UTI (urinary tract infection)  HLD (hyperlipidemia)  GERD (gastroesophageal reflux disease)  Iron deficiency anemia  BPH (benign prostatic hyperplasia)  CKD (chronic kidney disease) stage 4, GFR 15-29 ml/min (HCC)  Anemia in stage 4 chronic kidney  disease (HCC)  Secondary hyperparathyroidism of renal origin ([place])  Essential hypertension  Rhabdomyolysis  Heart failure with preserved ejection fraction ([place])  COVID-19 virus detected  Rib fracture  Sepsis ([place])  Urinary tract infection  Transaminitis   Physical Exam Vitals and nursing note reviewed.  Constitutional:      Comments: frail  HENT:     Head: Normocephalic and atraumatic.     Mouth/Throat:     Mouth: Mucous membranes are moist.  Cardiovascular:     Rate and Rhythm: Normal rate.     Pulses: Normal pulses.  Pulmonary:     Effort: Pulmonary effort is normal.  Abdominal:     Palpations: Abdomen is soft.  Musculoskeletal:     Cervical back: Normal range of motion.     Comments: Generalized weakness  Skin:    General: Skin is warm and dry.  Neurological:     Mental Status: He is alert. Mental status is at baseline.  Psychiatric:        Mood and Affect: Mood normal.        Behavior: Behavior normal.        Thought Content: Thought content normal.        Judgment: Judgment normal.    Palliative Assessment/Data: 40%     I discussed this patient's plan of care with patient, nursing, patient's son [name].  Thank you for this consult. Palliative medicine will continue to follow and assist holistically.   Time Total: 70 minutes Greater than 50%  of this time was spent counseling and coordinating care related to the above assessment and plan.  Signed by: Jordan Hawks, DNP, FNP-BC Palliative Medicine    Please contact Palliative Medicine Team phone at [phone] for questions and concerns.  For individual provider: See Shea Evans

## 2021-12-27 NOTE — Progress Notes (Signed)
Progress Note   Patient: Jay Mathis DOB: [birth year] DOA: 12/24/2021     2 DOS: the patient was seen and examined on 12/27/2021   Brief hospital course: 88 year old male with history of CAD, urinary retention, with indwelling Foley catheter since August 2020 (follows with urologist Dr. Diamantina Providence), CAD with history of CABG more than 10 years ago, iron deficiency anemia, non-insulin-dependent diabetes mellitus type 2, hyperlipidemia CKD stage IV, who presented emergency department from home via EMS  on evening of 12/24/2021 with altered mental status, weakness, and fall.  Evaluation in the ED revealed a UTI, pt met criteria for sepsis with tachypnea, tachycardia and elevated lactic acid.  Pt also tested positive for Covid-19 but denied any GI or respiratory symptoms.  Foley was exchanged in the ED.  Patient was started on empiric IV Rocephin pending urine cultures.  IV fluids were given with sepsis and elevated CK, initially 602 >> 5867 the next morning.  Assessment and Plan * Complicated UTI (urinary tract infection)- (present on admission) POA, UA with large Hbg, small leuk's, few bacteria, wbc 11-20. Complicated in the setting of chronic indwelling Foley, high risk for infection. Urine culture with more than 100,000 colonies multiple species, suggest recollection.  Suspect acute illness more related to Covid than UTI --Changed rocephin to Unasyn given signs of aspiration this AM and hypoxia. --Follow urine and blood cultures  Acute respiratory failure with hypoxia (HCC) Overnight, patient had rising oxygen requirements from 2 L/min to is much as 60 L on heated high flow nasal cannula.  Currently on 40 L/min this morning.  Also febrile as high as 102.5 overnight.  Likely due to COVID versus aspiration.  Seen to have difficulty swallowing coffee this morning with wet sounding cough and difficulty clearing secretions. -- Supplement O2 to maintain sats above 90%, wean as tolerated --  SLP consult for swallow evaluation -- Pulmonology consult -- Started steroids given COVID -- Start empiric Unasyn in case of aspiration -- Monitor closely -- Pulmonary hygiene  Transaminitis- (present on admission) ?due to rhabdo.  Improving. Monitor closely   Hypokalemia Resolved with replacement.  K 4.2 today. Monitor, replace as needed.  Right inguinal hernia No acute issues. Monitor.   Rib fracture- (present on admission) CT on admission noted a posterior 12th LEFT rib fracture of indeterminate age.  Possibly sustained in recent falls vs more remote.   --Supportive care --Pain control PRN --Incentive spirometrer  COVID-19 virus detected- (present on admission) POA.  Pt minimally symptomatic, does have wet sounding cough, extremely weak with falls.   [facility]) agrees with treating.   CXR negative for PNA. Placed on 2 L/min [place] O2 but charted sats 95-100%. Overnight, O2 needs increased, this AM on 40 L/min heated HFNC --Continue remdesevir --Mucinex --Vitamin C, Zinc, MV --Start IV steroids given now hypoxic --Monitor inflammatory markers, CBC, CMP --Pulm consulted, Dr. Lanney Gins  Weakness Pt had two falls at home, due to profound weakness in setting of infections.  Ambulates with a walker.   --PT / OT evaluations --Fall precautions --Treat infections --Optimize nutrition  Chronic indwelling Foley catheter Follows with urology for monthly exchanges.  Foley was replaced on admission in the ED on 1/15.  Heart failure with preserved ejection fraction ([place])- (present on admission) Stable, pt euvolemic to slightly dry. Echo 10/08/2021: EF 60 to 17%, grade 2 diastolic dysfunction. --Continue home Coreg --Home Lasix on hold giving IV fluids --Monitor volume status  Rhabdomyolysis- (present on admission) POA.  Due to trauma with falls  vs possibly Covid-19.   Initial CK 602 >> 5867>> 7933 this AM. Treated with IV fluids per sepsis on admission.   --continue IV  fluids --monitor CMP, CK daily  Acute metabolic encephalopathy- (present on admission) POA, due to infections with UTI and Covid-19.  Seemed improved yesterday, with worsened hypoxia overnight and this morning patient more confused again, seem to be hallucinating during encounter this morning. --Delirium precautions --Treat underlying infections and hypoxia --Monitor and correct electrolytes  Essential hypertension- (present on admission) BP stable, hypertensive today vs yesterday on admission. --Resume home Coreg and amlodipine 5 mg (for now, home dose 10 mg)  Secondary hyperparathyroidism of renal origin ([place])- (present on admission) Due to CKD.   Hyponatremia Na 130 today.  ?Poor PO intake.  On IV fluids for rhabdo ?hypervolemia (appears euvolemic on exam)  --check sOsm, urOsm, urNa --monitor BMP  Anemia in stage 4 chronic kidney disease ([place])- (present on admission) Hbg on admission 9.9.  Baseline in 2022 was 10.0-10.7.  Stable. --Monitor CBC  CKD (chronic kidney disease) stage 4, GFR 15-29 ml/min (HCC)- (present on admission) Renal function appears at baseline.  Cr on admission 2.36, similar to past several.  Creatinine today 1.85 improved.  Iron deficiency anemia- (present on admission) Hbg stable.  Iron studies normal on 12/19/20.  Recheck with AM labs.  BPH (benign prostatic hyperplasia)- (present on admission) With chronic urinary retention and indwelling Foley since August 2020. Follows with Dr. Diamantina Providence. Foley replaced in ED 1/15  CAD (coronary artery disease)- (present on admission) Stable.  Hold Lipitor with elevated CK. Continue home Coreg 3.125 mg BID as BP tolerates.  GERD (gastroesophageal reflux disease)- (present on admission) Continue PPI  Diabetes ([place]) Uncontrolled.  Last A1c 9.7%. On glipizide 2 mg daily - hold. Sliding scale Novolog CBG's, hypoglycemia protocol   HLD (hyperlipidemia)- (present on admission) Hold Lipitor with  rhabdomyolysis.  Sepsis ([place])- (present on admission) POA with tachypnea, tachycardia in setting of UTI and Covid-19 infections. Treated per sepsis protocol on admission. Sepsis physiology improved. --Continue antibiotics --Follow cultures --Monitor vitals & hemodynamics closely  Acute hypoxic respiratory failure secondary to COVID-19 viral infection. Not on oxygen supplementation at baseline O2 saturation 98% on room air On 2 L O2 saturation above 90 Continue to treat underlying condition, COVID-19 Continue bronchodilators Incentive spirometer, flutter valve Mobilize as tolerated  Subjective: 12/27/2021: Patient was seen and examined at his bedside.  Very hard of hearing.  O2 saturation 88% on room air.  On 2 L saturating low 90s.  Objective Vitals reviewed and notable for elevated blood pressure with systolic in the 235T to 732K.  General exam: Alert and interactive.  Very hard of hearing.   HEENT: Hard of hearing. Respiratory system: Mild rales at bases no wheezing noted.  Poor inspiratory effort. Cardiovascular system: Regular rate and rhythm no rubs or gallops.   Genitourinary system: Foley catheter in place. Central nervous system: Nonfocal exam.  Moves all 4 extremities.   Extremities: No lower extremity edema bilaterally. Skin: No rash or ulcerative lesions noted. Psychiatry: Mood is appropriate for condition.   Data Reviewed:  Labs reviewed and notable for: Sodium 134, potassium 3.3, chloride 97, glucose 207, BUN 37, creatinine 2.36, calcium 8.8, CK increased to 5867 (from 602), procalcitonin 0.23, B12 elevated 1211 , hemoglobin 9.9.  Hemoglobin A1c 9.7% (average glucose 204).  Family Communication: Son at bedside on rounds today  Disposition: Status is: Inpatient  Remains inpatient appropriate because: Severity of illness on IV therapies as outlined above  Time spent: 40 minutes  Author: Kayleen Memos, DO 12/27/2021 12:54 PM  For on call  review [email or web address].

## 2021-12-28 DIAGNOSIS — N2581 Secondary hyperparathyroidism of renal origin: Secondary | ICD-10-CM

## 2021-12-28 DIAGNOSIS — G9341 Metabolic encephalopathy: Secondary | ICD-10-CM

## 2021-12-28 DIAGNOSIS — K219 Gastro-esophageal reflux disease without esophagitis: Secondary | ICD-10-CM

## 2021-12-28 DIAGNOSIS — M6282 Rhabdomyolysis: Secondary | ICD-10-CM

## 2021-12-28 LAB — COMPREHENSIVE METABOLIC PANEL
ALT: 34 U/L (ref 0–44)
AST: 76 U/L — ABNORMAL HIGH (ref 15–41)
Albumin: 2.5 g/dL — ABNORMAL LOW (ref 3.5–5.0)
Alkaline Phosphatase: 63 U/L (ref 38–126)
Anion gap: 7 (ref 5–15)
BUN: 73 mg/dL — ABNORMAL HIGH (ref 8–23)
CO2: 25 mmol/L (ref 22–32)
Calcium: 8 mg/dL — ABNORMAL LOW (ref 8.9–10.3)
Chloride: 98 mmol/L (ref 98–111)
Creatinine, Ser: 2.48 mg/dL — ABNORMAL HIGH (ref 0.61–1.24)
GFR, Estimated: 23 mL/min — ABNORMAL LOW (ref >60)
Glucose, Bld: 198 mg/dL — ABNORMAL HIGH (ref 70–99)
Potassium: 5.3 mmol/L — ABNORMAL HIGH (ref 3.5–5.1)
Sodium: 130 mmol/L — ABNORMAL LOW (ref 135–145)
Total Bilirubin: 0.6 mg/dL (ref 0.3–1.2)
Total Protein: 5.6 g/dL — ABNORMAL LOW (ref 6.5–8.1)

## 2021-12-28 LAB — CBC WITH DIFFERENTIAL/PLATELET
Abs Immature Granulocytes: 0.17 10*3/uL — ABNORMAL HIGH (ref 0.00–0.07)
Basophils Absolute: 0 10*3/uL (ref 0.0–0.1)
Basophils Relative: 0 %
Eosinophils Absolute: 0 10*3/uL (ref 0.0–0.5)
Eosinophils Relative: 0 %
HCT: 28 % — ABNORMAL LOW (ref 39.0–52.0)
Hemoglobin: 9.6 g/dL — ABNORMAL LOW (ref 13.0–17.0)
Immature Granulocytes: 1 %
Lymphocytes Relative: 5 %
Lymphs Abs: 0.9 10*3/uL (ref 0.7–4.0)
MCH: 27.4 pg (ref 26.0–34.0)
MCHC: 34.3 g/dL (ref 30.0–36.0)
MCV: 79.8 fL — ABNORMAL LOW (ref 80.0–100.0)
Monocytes Absolute: 0.6 10*3/uL (ref 0.1–1.0)
Monocytes Relative: 4 %
Neutro Abs: 15.4 10*3/uL — ABNORMAL HIGH (ref 1.7–7.7)
Neutrophils Relative %: 90 %
Platelets: 151 10*3/uL (ref 150–400)
RBC: 3.51 MIL/uL — ABNORMAL LOW (ref 4.22–5.81)
RDW: 14.2 % (ref 11.5–15.5)
WBC: 17.1 10*3/uL — ABNORMAL HIGH (ref 4.0–10.5)
nRBC: 0 % (ref 0.0–0.2)

## 2021-12-28 LAB — GLUCOSE, CAPILLARY
Glucose-Capillary: 195 mg/dL — ABNORMAL HIGH (ref 70–99)
Glucose-Capillary: 337 mg/dL — ABNORMAL HIGH (ref 70–99)
Glucose-Capillary: 279 mg/dL — ABNORMAL HIGH (ref 70–99)
Glucose-Capillary: 252 mg/dL — ABNORMAL HIGH (ref 70–99)

## 2021-12-28 LAB — C-REACTIVE PROTEIN: CRP: 9.2 mg/dL — ABNORMAL HIGH (ref <1.0)

## 2021-12-28 LAB — CK: Total CK: 1422 U/L — ABNORMAL HIGH (ref 49–397)

## 2021-12-28 MED ORDER — METHYLPREDNISOLONE SODIUM SUCC 40 MG IJ SOLR: 40.0000 mg | Freq: Every day | INTRAMUSCULAR | Status: DC

## 2021-12-28 MED ORDER — PHENOL 1.4 % MT LIQD
1.0000 | OROMUCOSAL | Status: DC | PRN
  Administered 2021-12-28: 1 via OROMUCOSAL
  Filled 2021-12-28: qty 177

## 2021-12-28 MED ORDER — METHYLPREDNISOLONE SODIUM SUCC 40 MG IJ SOLR
40.0000 mg | Freq: Every day | INTRAMUSCULAR | Status: DC
  Administered 2021-12-28 – 2021-12-30 (×3): 40 mg via INTRAVENOUS
  Filled 2021-12-28 (×3): qty 1

## 2021-12-28 NOTE — Progress Notes (Signed)
Palliative Care Progress Note, Assessment & Plan   Patient Name: Jay Mathis       Date: 12/28/2021 DOB: [birth year]  Age: 88 y.o. MRN#: [id number] Attending Physician: Jay Memos, DO Primary Care Physician: Jay [place], MD Admit Date: 12/24/2021  Reason for Consultation/Follow-up: Establishing goals of care  Subjective: Patient resting in no apparent distress.  No family at bedside.  HPI: 88 y.o. male  with past medical history of CAD with history of CABG (approximately 10 years ago), urinary retention with chronic Foley (since August 2020), iron deficiency anemia, diabetes type 2 (non-insulin-dependent) HLD, CKD (stage IV), HTN, GERD, right inguinal hernia, BPH, and recent falls (ambulates with walker at baseline) admitted on 12/24/2021 with altered mental status, weakness, and multiple falls prior to admission at home.  Patient found to be COVID-positive and positive for UTI.  CT reveals rib fracture present on admission.   Summary of counseling/coordination of care: After reviewing the patient's chart I followed up with patient's son Jay Mathis over the phone.  Jay Mathis continues to not feel well and shares he has not been able to visit with his father for the past few days due to his own health problems.  Discussed a brief life review of the patient.  Jay Mathis shares the patient lost his wife approximately 3 years ago.  Jay Mathis endorses patient has gone "downhill ever since".  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  In retirement he enjoyed vacationing with his wife and 1 other couple.  He also enjoys watching NASCAR, [place], and drag racing on TV.  I attempted to elicit values and goals important to the patient.  Jay Mathis shares the patient had stated prior to admission that "I am ready to be with the  [place]".  Human mortality discussed.  Faith in the Lord's important to [place].  He shares that we are not in charge and that his father will go whenever the [facility] is ready to take him.  Disposition options discussed including home with hospice, hospice inpatient unit, and going to rehab with extra layer of palliative support.  Jay Mathis shares he is not prepared to make a decision regarding disposition at this time.  He appreciates all options and understands these are recommendations that he and his brother need to discuss.  Questions and concerns addressed.  Palliative medicine team will continue to follow patient throughout his hospitalization.  Code Status: DNR  Prognosis: Unable to determine  Discharge Planning: To Be Determined  Recommendations/Plan: Continue discussions regarding goals of care important to the patient Treat the treatable  Care plan was discussed with patient's son Jay Mathis  Physical Exam Vitals and nursing note reviewed.  Constitutional:      Comments: frail  HENT:     Head: Normocephalic and atraumatic.     Mouth/Throat:     Mouth: Mucous membranes are moist.  Cardiovascular:     Rate and Rhythm: Normal rate.     Pulses: Normal pulses.  Pulmonary:     Effort: Pulmonary effort is normal.  Musculoskeletal:     Comments: Generalized weakness  Skin:    General: Skin is warm and dry.  Palliative Assessment/Data: 30%    Total Time 25 minutes  Greater than 50%  of this time was spent counseling and coordinating care related to the above assessment and plan.  Thank you for allowing the Palliative Medicine Team to assist in the care of this patient.  [place] Jay Iha, FNP-BC Palliative Medicine Team Team Phone # [phone]

## 2021-12-28 NOTE — Progress Notes (Signed)
Progress Note   Patient: Jay Mathis HYQ:[id number] DOB: 1924/06/15 DOA: 12/24/2021     3 DOS: the patient was seen and examined on 12/28/2021   Brief hospital course: 88 year old male with history of CAD, urinary retention, with indwelling Foley catheter since August 2020 (follows with urologist Dr. Diamantina Providence), CAD with history of CABG more than 10 years ago, iron deficiency anemia, non-insulin-dependent diabetes mellitus type 2, hyperlipidemia CKD stage IV, who presented emergency department from home via EMS  on evening of 12/24/2021 with altered mental status, weakness, and fall.  Evaluation in the ED revealed a UTI, pt met criteria for sepsis with tachypnea, tachycardia and elevated lactic acid.  Pt also tested positive for Covid-19 but denied any GI or respiratory symptoms.  Foley was exchanged in the ED.  Patient was started on empiric IV Rocephin pending urine cultures.  IV fluids were given with sepsis and elevated CK, initially 602 >> 5867 the next morning.  Assessment and Plan * Complicated UTI (urinary tract infection)- (present on admission) POA, UA with large Hbg, small leuk's, few bacteria, wbc 11-20. Complicated in the setting of chronic indwelling Foley, high risk for infection. Urine culture with more than 100,000 colonies multiple species, suggest recollection.  Suspect acute illness more related to Covid than UTI --Changed rocephin to Unasyn given signs of aspiration this AM and hypoxia. --Follow urine and blood cultures  Acute respiratory failure with hypoxia (HCC) Overnight, patient had rising oxygen requirements from 2 L/min to is much as 60 L on heated high flow nasal cannula.  Currently on 40 L/min this morning.  Also febrile as high as 102.5 overnight.  Likely due to COVID versus aspiration.  Seen to have difficulty swallowing coffee this morning with wet sounding cough and difficulty clearing secretions. -- Supplement O2 to maintain sats above 90%, wean as tolerated --  SLP consult for swallow evaluation -- Pulmonology consult -- Started steroids given COVID -- Start empiric Unasyn in case of aspiration -- Monitor closely -- Pulmonary hygiene  Transaminitis- (present on admission) ?due to rhabdo.  Improving. Monitor closely   Hypokalemia Resolved with replacement.  K 4.2 today. Monitor, replace as needed.  Right inguinal hernia No acute issues. Monitor.   Rib fracture- (present on admission) CT on admission noted a posterior 12th LEFT rib fracture of indeterminate age.  Possibly sustained in recent falls vs more remote.   --Supportive care --Pain control PRN --Incentive spirometrer  COVID-19 virus detected- (present on admission) POA.  Pt minimally symptomatic, does have wet sounding cough, extremely weak with falls.   [facility]) agrees with treating.   CXR negative for PNA. Placed on 2 L/min [place] O2 but charted sats 95-100%. Overnight, O2 needs increased, this AM on 40 L/min heated HFNC --Continue remdesevir --Mucinex --Vitamin C, Zinc, MV --Start IV steroids given now hypoxic --Monitor inflammatory markers, CBC, CMP --Pulm consulted, Dr. Lanney Gins  Weakness Pt had two falls at home, due to profound weakness in setting of infections.  Ambulates with a walker.   --PT / OT evaluations --Fall precautions --Treat infections --Optimize nutrition  Chronic indwelling Foley catheter Follows with urology for monthly exchanges.  Foley was replaced on admission in the ED on 1/15.  Heart failure with preserved ejection fraction ([place])- (present on admission) Stable, pt euvolemic to slightly dry. Echo 10/08/2021: EF 60 to 95%, grade 2 diastolic dysfunction. --Continue home Coreg --Home Lasix on hold giving IV fluids --Monitor volume status  Rhabdomyolysis- (present on admission) POA.  Due to trauma with falls  vs possibly Covid-19.   Initial CK 602 >> 5867>> 7933 this AM. Treated with IV fluids per sepsis on admission.   --continue IV  fluids --monitor CMP, CK daily  Acute metabolic encephalopathy- (present on admission) POA, due to infections with UTI and Covid-19.  Seemed improved yesterday, with worsened hypoxia overnight and this morning patient more confused again, seem to be hallucinating during encounter this morning. --Delirium precautions --Treat underlying infections and hypoxia --Monitor and correct electrolytes  Essential hypertension- (present on admission) BP stable, hypertensive today vs yesterday on admission. --Resume home Coreg and amlodipine 5 mg (for now, home dose 10 mg)  Secondary hyperparathyroidism of renal origin ([place])- (present on admission) Due to CKD.   Hyponatremia Na 130 today.  ?Poor PO intake.  On IV fluids for rhabdo ?hypervolemia (appears euvolemic on exam)  --check sOsm, urOsm, urNa --monitor BMP  Anemia in stage 4 chronic kidney disease ([place])- (present on admission) Hbg on admission 9.9.  Baseline in 2022 was 10.0-10.7.  Stable. --Monitor CBC  CKD (chronic kidney disease) stage 4, GFR 15-29 ml/min (HCC)- (present on admission) Renal function appears at baseline.  Cr on admission 2.36, similar to past several.  Creatinine today 1.85 improved.  Iron deficiency anemia- (present on admission) Hbg stable.  Iron studies normal on 12/19/20.  Recheck with AM labs.  BPH (benign prostatic hyperplasia)- (present on admission) With chronic urinary retention and indwelling Foley since August 2020. Follows with Dr. Diamantina Providence. Foley replaced in ED 1/15  CAD (coronary artery disease)- (present on admission) Stable.  Hold Lipitor with elevated CK. Continue home Coreg 3.125 mg BID as BP tolerates.  GERD (gastroesophageal reflux disease)- (present on admission) Continue PPI  Diabetes ([place]) Uncontrolled.  Last A1c 9.7%. On glipizide 2 mg daily - hold. Sliding scale Novolog CBG's, hypoglycemia protocol   HLD (hyperlipidemia)- (present on admission) Hold Lipitor with  rhabdomyolysis.  Sepsis ([place])- (present on admission) POA with tachypnea, tachycardia in setting of UTI and Covid-19 infections. Treated per sepsis protocol on admission. Sepsis physiology improved. --Continue antibiotics --Follow cultures --Monitor vitals & hemodynamics closely  Acute hypoxic respiratory failure secondary to COVID-19 viral infection. Not on oxygen supplementation at baseline O2 saturation 98% on room air On 2 L O2 saturation above 90 Continue to treat underlying condition, COVID-19 Continue bronchodilators Incentive spirometer, flutter valve Mobilize as tolerated Continue remdesivir Wean off IV Solu-Medrol Continue Protonix 40 mg twice daily Continue Unasyn due to suspicion for aspiration pneumonia.  Subjective: 12/28/2021: Patient seen and examined at bedside.  Reports persistent productive cough.  He has no other complaint.  Denies any chest pain.  Objective Vitals reviewed and notable for elevated blood pressure with systolic in the 656C to 127N.  General exam: Alert and interactive.  Very hard of hearing.  In no acute distress. HEENT: Hard of hearing. Respiratory system: Mild rales at bases no wheezing noted.  Cardiovascular system: Regular rate and rhythm no rubs or gallops.   Central nervous system: Nonfocal exam.  Moves all 4 extremities. Extremities: Trace lower extremity edema bilaterally.  Hyperpigmented. Psychiatry: Mood is appropriate for condition and setting.   Data Reviewed:  Labs reviewed and notable for: Sodium 134, potassium 3.3, chloride 97, glucose 207, BUN 37, creatinine 2.36, calcium 8.8, CK increased to 5867 (from 602), procalcitonin 0.23, B12 elevated 1211 , hemoglobin 9.9.  Hemoglobin A1c 9.7% (average glucose 204).  Family Communication: Son at bedside on rounds today  Disposition: Status is: Inpatient  Remains inpatient appropriate because: Severity of illness on IV therapies  as outlined above         Time spent: 40  minutes  Author: Kayleen Memos, DO 12/28/2021 3:32 PM  For on call review [email or web address].

## 2021-12-28 NOTE — Plan of Care (Signed)
°  Problem: Health Behavior/Discharge Planning: Goal: Ability to manage health-related needs will improve Outcome: Progressing   Problem: Clinical Measurements: Goal: Cardiovascular complication will be avoided Outcome: Progressing   Problem: Safety: Goal: Ability to remain free from injury will improve Outcome: Progressing   Problem: Respiratory: Goal: Will maintain a patent airway Outcome: Progressing   Problem: Respiratory: Goal: Complications related to the disease process, condition or treatment will be avoided or minimized Outcome: Progressing

## 2021-12-29 LAB — CULTURE, BLOOD (ROUTINE X 2)
Culture: NO GROWTH
Culture: NO GROWTH
Special Requests: ADEQUATE
Special Requests: ADEQUATE

## 2021-12-29 LAB — COMPREHENSIVE METABOLIC PANEL WITH GFR
ALT: 32 U/L (ref 0–44)
AST: 56 U/L — ABNORMAL HIGH (ref 15–41)
Albumin: 2.6 g/dL — ABNORMAL LOW (ref 3.5–5.0)
Alkaline Phosphatase: 59 U/L (ref 38–126)
Anion gap: 9 (ref 5–15)
BUN: 67 mg/dL — ABNORMAL HIGH (ref 8–23)
CO2: 23 mmol/L (ref 22–32)
Calcium: 7.7 mg/dL — ABNORMAL LOW (ref 8.9–10.3)
Chloride: 99 mmol/L (ref 98–111)
Creatinine, Ser: 2.28 mg/dL — ABNORMAL HIGH (ref 0.61–1.24)
GFR, Estimated: 26 mL/min — ABNORMAL LOW
Glucose, Bld: 213 mg/dL — ABNORMAL HIGH (ref 70–99)
Potassium: 4.1 mmol/L (ref 3.5–5.1)
Sodium: 131 mmol/L — ABNORMAL LOW (ref 135–145)
Total Bilirubin: 0.5 mg/dL (ref 0.3–1.2)
Total Protein: 5.6 g/dL — ABNORMAL LOW (ref 6.5–8.1)

## 2021-12-29 LAB — CBC WITH DIFFERENTIAL/PLATELET
Abs Immature Granulocytes: 0.09 10*3/uL — ABNORMAL HIGH (ref 0.00–0.07)
Basophils Absolute: 0 10*3/uL (ref 0.0–0.1)
Basophils Relative: 0 %
Eosinophils Absolute: 0 10*3/uL (ref 0.0–0.5)
Eosinophils Relative: 0 %
HCT: 26 % — ABNORMAL LOW (ref 39.0–52.0)
Hemoglobin: 8.9 g/dL — ABNORMAL LOW (ref 13.0–17.0)
Immature Granulocytes: 1 %
Lymphocytes Relative: 5 %
Lymphs Abs: 0.7 10*3/uL (ref 0.7–4.0)
MCH: 27.1 pg (ref 26.0–34.0)
MCHC: 34.2 g/dL (ref 30.0–36.0)
MCV: 79 fL — ABNORMAL LOW (ref 80.0–100.0)
Monocytes Absolute: 0.7 10*3/uL (ref 0.1–1.0)
Monocytes Relative: 5 %
Neutro Abs: 12.4 10*3/uL — ABNORMAL HIGH (ref 1.7–7.7)
Neutrophils Relative %: 89 %
Platelets: 156 10*3/uL (ref 150–400)
RBC: 3.29 MIL/uL — ABNORMAL LOW (ref 4.22–5.81)
RDW: 13.9 % (ref 11.5–15.5)
WBC: 13.9 10*3/uL — ABNORMAL HIGH (ref 4.0–10.5)
nRBC: 0 % (ref 0.0–0.2)

## 2021-12-29 LAB — GLUCOSE, CAPILLARY
Glucose-Capillary: 329 mg/dL — ABNORMAL HIGH (ref 70–99)
Glucose-Capillary: 196 mg/dL — ABNORMAL HIGH (ref 70–99)
Glucose-Capillary: 224 mg/dL — ABNORMAL HIGH (ref 70–99)

## 2021-12-29 LAB — C-REACTIVE PROTEIN: CRP: 6.1 mg/dL — ABNORMAL HIGH

## 2021-12-29 LAB — CK: Total CK: 691 U/L — ABNORMAL HIGH (ref 49–397)

## 2021-12-29 MED ORDER — INSULIN ASPART 100 UNIT/ML IJ SOLN
0.0000 [IU] | Freq: Three times a day (TID) | INTRAMUSCULAR | Status: DC
  Administered 2021-12-29: 11 [IU] via SUBCUTANEOUS
  Administered 2021-12-29: 3 [IU] via SUBCUTANEOUS
  Administered 2021-12-30: 5 [IU] via SUBCUTANEOUS
  Administered 2021-12-30 – 2021-12-31 (×3): 3 [IU] via SUBCUTANEOUS
  Filled 2021-12-29 (×6): qty 1

## 2021-12-29 MED ORDER — LINAGLIPTIN 5 MG PO TABS
5.0000 mg | ORAL_TABLET | Freq: Every day | ORAL | Status: DC
  Administered 2021-12-29 – 2022-01-01 (×4): 5 mg via ORAL
  Filled 2021-12-29 (×4): qty 1

## 2021-12-29 NOTE — Progress Notes (Signed)
Occupational Therapy Treatment Patient Details Name: Jay Mathis MRN: [id number] DOB: 1924-10-19 Today's Date: 12/29/2021   History of present illness 88 year old male with history of CAD, urinary retention, with indwelling Foley catheter since August 2020 (follows with urologist Dr. Diamantina Providence), CAD with history of CABG more than 10 years ago, iron deficiency anemia, non-insulin-dependent diabetes mellitus type 2, hyperlipidemia CKD stage IV, who presented emergency department from home via EMS  on evening of 12/24/2021 with altered mental status, weakness, and fall.     Evaluation in the ED revealed a UTI, pt met criteria for sepsis with tachypnea, tachycardia and elevated lactic acid.  Pt also tested positive for Covid-19   OT comments  Pt seen for OT tx this date. Pt on 2L O2, denies SOB, very HOH. Pt performed bed mobility with MIN-MOD A + VC for hand placement to improve technique. Once seated EOB, pt demo'd difficulty with static sitting balance, requiring CGA-MIN A to correct + VC to attempt to self correct posterior lean. Pt tolerated sitting EOB for brushing teeth and cleaning dentures and washing face with set up and PRN VC for sequencing. Pt denied SOB throughout. SpO2 remained >95% throughout on 2L O2. Continue to recommend SNF at discharge.    Recommendations for follow up therapy are one component of a multi-disciplinary discharge planning process, led by the attending physician.  Recommendations may be updated based on patient status, additional functional criteria and insurance authorization.    Follow Up Recommendations  Skilled nursing-short term rehab (<3 hours/day)    Assistance Recommended at Discharge Frequent or constant Supervision/Assistance  Patient can return home with the following  A lot of help with walking and/or transfers;A lot of help with bathing/dressing/bathroom;Assistance with cooking/housework;Assist for transportation;Direct supervision/assist for medications  management;Help with stairs or ramp for entrance   Equipment Recommendations  None recommended by OT    Recommendations for Other Services      Precautions / Restrictions Precautions Precautions: Fall Precaution Comments: airborne precautions Restrictions Weight Bearing Restrictions: No       Mobility Bed Mobility Overal bed mobility: Needs Assistance Bed Mobility: Supine to Sit, Sit to Supine     Supine to sit: Min assist, Mod assist, HOB elevated Sit to supine: Min assist   General bed mobility comments: increased time, effort to perform    Transfers                         Balance Overall balance assessment: Needs assistance Sitting-balance support: Feet supported, No upper extremity supported, Single extremity supported Sitting balance-Leahy Scale: Poor Sitting balance - Comments: difficulty maintaining sitting balance during grooming tasks, requiring CGA-MIN A to correct + VC for attempt to correct for posterior lean Postural control: Posterior lean                                 ADL either performed or assessed with clinical judgement   ADL Overall ADL's : Needs assistance/impaired     Grooming: Sitting;Oral care;Wash/dry face;Wash/dry hands;Min guard;Cueing for sequencing Grooming Details (indicate cue type and reason): Pt sat EOB with CGA-MIN A for static sitting balance with tendency for posterior lean requiring VC to attempt to correct, able to brush teeth and clean his dentures with PRN VC for sequencing  Extremity/Trunk Assessment              Vision       Perception     Praxis      Cognition Arousal/Alertness: Awake/alert Behavior During Therapy: WFL for tasks assessed/performed Overall Cognitive Status: No family/caregiver present to determine baseline cognitive functioning                                 General Comments: Pt HOH, follows simple  commands with VC        Exercises      Shoulder Instructions       General Comments      Pertinent Vitals/ Pain       Pain Assessment Pain Assessment: No/denies pain  Home Living                                          Prior Functioning/Environment              Frequency  Min 2X/week        Progress Toward Goals  OT Goals(current goals can now be found in the care plan section)  Progress towards OT goals: Progressing toward goals  Acute Rehab OT Goals Patient Stated Goal: for pt to be safe and comfortable OT Goal Formulation: With family Time For Goal Achievement: 01/10/22 Potential to Achieve Goals: Good  Plan Discharge plan remains appropriate;Frequency remains appropriate    Co-evaluation                 AM-PAC OT "6 Clicks" Daily Activity     Outcome Measure   Help from another person eating meals?: None Help from another person taking care of personal grooming?: A Little Help from another person toileting, which includes using toliet, bedpan, or urinal?: A Lot Help from another person bathing (including washing, rinsing, drying)?: A Lot Help from another person to put on and taking off regular upper body clothing?: A Little Help from another person to put on and taking off regular lower body clothing?: A Lot 6 Click Score: 16    End of Session Equipment Utilized During Treatment: Oxygen  OT Visit Diagnosis: Unsteadiness on feet (R26.81);Muscle weakness (generalized) (M62.81);History of falling (Z91.81)   Activity Tolerance Patient tolerated treatment well   Patient Left in bed;with call bell/phone within reach;with bed alarm set   Nurse Communication          Time: 3557-3220 OT Time Calculation (min): 21 min  Charges: OT General Charges $OT Visit: 1 Visit OT Treatments $Self Care/Home Management : 8-22 mins  Ardeth Perfect., MPH, MS, OTR/L ascom [phone] 12/29/21, 4:09 PM

## 2021-12-29 NOTE — Consult Note (Signed)
Pharmacy Antibiotic Note  Jay Mathis is a 88 y.o. male admitted on 12/24/2021 with sepsis and pneumonia.  Pharmacy has been consulted for Unasyn dosing. Patient received Ceftriaxone x 3 days for UTI.  Plan: Day 3 of unasyn. Continue unasyn 3g IV Q12 hours.   Will continue to monitor renal function and adjust dose as necessary  Height: 6' (182.9 cm) Weight: 63 kg (138 lb 14.2 oz) IBW/kg (Calculated) : 77.6  Temp (24hrs), Avg:97.9 F (36.6 C), Min:97.8 F (36.6 C), Max:98 F (36.7 C)  Recent Labs  Lab 12/24/21 1828 12/24/21 2115 12/25/21 0041 12/26/21 0550 12/27/21 0723 12/28/21 0727 12/29/21 0741  WBC 6.5  --  6.8 10.2 15.8* 17.1* 13.9*  CREATININE 2.62*  --  2.36* 1.85* 2.40* 2.48* 2.28*  LATICACIDVEN 2.8* 1.4  --   --   --   --   --      Estimated Creatinine Clearance: 17.3 mL/min (A) (by C-G formula based on SCr of 2.28 mg/dL (H)).    Allergies  Allergen Reactions   Sulfa Antibiotics Rash    Antimicrobials this admission: Ceftriaxone 1/15 >> 1/17 Unasyn 1/18 >>    Microbiology results: 1/15 BCx: NGTD 1/15 UCx: 100k Mult. Species   Thank you for allowing pharmacy to be a part of this patients care.  Oswald Hillock 12/29/2021 9:58 AM

## 2021-12-29 NOTE — Progress Notes (Signed)
SLP Cancellation Note  Patient Details Name: Jay Mathis MRN: [id number] DOB: [birth year]   Cancelled treatment:       Reason Eval/Treat Not Completed:  (chart reviewed; consulted Palliative Care today re: pt's status). Per NSG report, pt is tolerating po meds w/ puree and bites of po's w/ no overt s/s of aspiration noted. Per Palliative Care, pt is not taking much po's though given encouragement to do so.  Recommend continue the current Minced foods diet w/ thin liquids for conservation of energy; and general aspiration precautions. No further skilled ST services indicated at this time. MD to reconsult ST services if new needs arise during this admit. Palliative care agreed. NSG updated.       Orinda Kenner, MS, CCC-SLP Speech Language Pathologist Rehab Services; [place] [phone] (ascom) Khian Remo 12/29/2021, 5:39 PM

## 2021-12-29 NOTE — Progress Notes (Signed)
°Progress Note ° ° °Patient: Jay Mathis MRN:[id number] DOB: 12/16/1923 DOA: 12/24/2021     4 °DOS: the patient was seen and examined on 12/29/2021 °  °Brief hospital course: °88-year-old male with history of CAD, urinary retention, with indwelling Foley catheter since August 2020 (follows with urologist Dr. Sninsky), CAD with history of CABG more than 10 years ago, iron deficiency anemia, non-insulin-dependent diabetes mellitus type 2, hyperlipidemia CKD stage IV, who presented emergency department from home via EMS  on evening of 12/24/2021 with altered mental status, weakness, and fall. ° °Evaluation in the ED revealed a UTI, pt met criteria for sepsis with tachypnea, tachycardia and elevated lactic acid.  Pt also tested positive for Covid-19 but denied any GI or respiratory symptoms.  Foley was exchanged in the ED.  Patient was started on empiric IV Rocephin pending urine cultures.  IV fluids were given with sepsis and elevated CK, initially 602 >> 5867 the next morning. ° ° °Assessment and Plan °* Acute respiratory failure with hypoxia (HCC) °much as 60 L on heated high flow nasal cannula.   °Likely due to COVID versus aspiration.  Seen to have difficulty swallowing coffee   °-- SLP consult for swallow evaluation °-- Continue steroids °-- Continue Unasyn °-- Pulmonary hygiene ° °COVID-19 virus detected- (present on admission) ° Remdesivir completed °-Continue steroids ° °Pyuria- (present on admission) °POA, UA with large Hbg °Urine culture with more than 100,000 colonies multiple species, suggest recollection.  Suspect acute illness more related to Covid than UTI °UTI ruled out ° °Sepsis (HCC)- (present on admission) °POA with tachypnea, tachycardia and decreased mentation confusion  °Sepsis due to COVID  °Viral sepsis ° °Acute metabolic encephalopathy- (present on admission) °POA, at baseline, patient lives independently, alone, no cognitive impairment.  Here he was confused, and disoriented. Seemed to be  hallucinating during encounter this time. °--Delirium precautions ° ° °Hypokalemia °Resolved with replacement.  °  ° °Right inguinal hernia °No acute issues. Monitor.  ° °Rib fracture- (present on admission) °CT on admission noted a posterior 12th LEFT rib fracture of indeterminate age.  Possibly sustained in recent falls vs more remote.   °--Supportive care °--Pain control PRN °--Incentive spirometrer ° °Chronic indwelling Foley catheter °Follows with urology for monthly exchanges.  Foley was replaced on admission in the ED on 1/15. ° °Chronic diastolic CHF (congestive heart failure) (HCC)- (present on admission) °Stable, pt euvolemic to slightly dry. Echo 10/08/2021: EF 60 to 65%, grade 2 diastolic dysfunction. °--Continue home Coreg °--Hold lasix ° °Rhabdomyolysis- (present on admission) °POA.  Due to trauma with falls vs possibly Covid-19.   °Initial CK 602 >> 5867>> 7933 > 3000 °-Hold fluids ° °Essential hypertension- (present on admission) °BP stable °--Continue Coreg, amlodipine ° °Secondary hyperparathyroidism of renal origin (HCC)- (present on admission) °Due to CKD.  ° °Hyponatremia °Na stable °-Hold fluids ° °Anemia in stage 4 chronic kidney disease (HCC)- (present on admission) °Hbg on admission 9.9.  Baseline in 2022 was 10.0-10.7.  Stable. °  ° °CKD (chronic kidney disease) stage 4, GFR 15-29 ml/min (HCC)- (present on admission) °Renal function appears at baseline.  °Cr on admission 2.36, similar to past several.   ° °Iron deficiency anemia- (present on admission) °Hbg stable.  Iron studies normal on 12/19/20.    ° °BPH (benign prostatic hyperplasia)- (present on admission) °With chronic urinary retention and indwelling Foley since August 2020. °Follows with Dr. Sninsky. °Foley replaced in ED 1/15 ° °CAD (coronary artery disease)- (present on admission) °-Hold atorvastatin given Rhabdo °-  Continue Coreg  ° °GERD (gastroesophageal reflux disease) °Continue PPI ° °Type 2 diabetes mellitus with stage 4  chronic kidney disease (HCC) °Uncontrolled.  Last A1c 9.7%. °Glucose elevated °-Hold glipizide °-Continue Sliding scale Novolog °- Start linagliptin ° ° °HLD (hyperlipidemia) °Hold Lipitor with rhabdomyolysis. ° °Transaminitis-resolved as of 12/29/2021, (present on admission) °?due to rhabdo.  Improving. °Monitor closely  ° °Weakness-resolved as of 12/29/2021 °Pt had two falls at home, due to profound weakness in setting of infections.  Ambulates with a walker.   °--PT / OT evaluations °--Fall precautions °--Treat infections °--Optimize nutrition ° ° ° ° °Subjective: Patient asking to go home.  No fever.  No confusion.  States his respiratory status is good. ° °Objective °Vital signs were reviewed and unremarkable.  Still on 2 L oxygen °Elderly adult male, lying in bed, no acute distress, lung sounds diminished, no rales or wheezes, abdomen soft, no tenderness palpation, heart rate regular, no murmurs, no lower extremity edema, attention slightly diminished, affect blunted, very hard of hearing, judgment insight appear impaired . ° °Data Reviewed: ° °My review of labs and imaging is notable for creatinine down to 2.2, sodium 131, stable, AST stable at 56, WBC down to 14 from 17, and hemoglobin down to 9.8.  Glycemia noted. ° °Family Communication: Son at the bedside ° °Disposition: °Status is: Inpatient ° °Remains inpatient appropriate because: He requires ongoing supplemental oxygen, IV steroids, and physical therapy, likely rehabilitation will be required at the time of discharge.  Social work is pending  ° ° ° ° ° °Author: °Christopher P Danford, MD °12/29/2021 8:45 PM ° °For on call review [email or web address].  ° °

## 2021-12-29 NOTE — Progress Notes (Signed)
Physical Therapy Treatment Patient Details Name: Jay Mathis MRN: [id number] DOB: [birth year] Today's Date: 12/29/2021   History of Present Illness 88 year old male with history of CAD, urinary retention, with indwelling Foley catheter since August 2020 (follows with urologist Dr. Diamantina Providence), CAD with history of CABG more than 10 years ago, iron deficiency anemia, non-insulin-dependent diabetes mellitus type 2, hyperlipidemia CKD stage IV, who presented emergency department from home via EMS  on evening of 12/24/2021 with altered mental status, weakness, and fall.     Evaluation in the ED revealed a UTI, pt met criteria for sepsis with tachypnea, tachycardia and elevated lactic acid.  Pt also tested positive for Covid-19    PT Comments    Pt was pleasant and motivated to participate during the session and put forth good effort throughout. Pt found on 2LO2/min with SpO2 in the mid to upper 90s.  Pt required min A with all functional tasks and presented with posterior instability in both sitting and standing.  Pt's SpO2 dropped to a low of 83% during ambulation but quickly increased back to the mid 90s upon returning to sitting.  Pt reported no adverse symptoms during the session.  Pt will benefit from PT services in a SNF setting upon discharge to safely address deficits listed in patient problem list for decreased caregiver assistance and eventual return to PLOF.     Recommendations for follow up therapy are one component of a multi-disciplinary discharge planning process, led by the attending physician.  Recommendations may be updated based on patient status, additional functional criteria and insurance authorization.  Follow Up Recommendations  Skilled nursing-short term rehab (<3 hours/day)     Assistance Recommended at Discharge Frequent or constant Supervision/Assistance  Patient can return home with the following A little help with walking and/or transfers;A little help with  bathing/dressing/bathroom;Direct supervision/assist for medications management;Direct supervision/assist for financial management;Help with stairs or ramp for entrance;Assistance with cooking/housework   Equipment Recommendations  Rolling walker (2 wheels)    Recommendations for Other Services       Precautions / Restrictions Precautions Precautions: Fall Precaution Comments: airborne precautions Restrictions Weight Bearing Restrictions: No     Mobility  Bed Mobility Overal bed mobility: Needs Assistance Bed Mobility: Supine to Sit     Supine to sit: Min assist     General bed mobility comments: Min A for BLE and trunk control    Transfers Overall transfer level: Needs assistance Equipment used: Rolling walker (2 wheels) Transfers: Sit to/from Stand Sit to Stand: Min assist, From elevated surface           General transfer comment: Min A to come to standing from an elevated surface and to prevent posterior LOB upon initial stand    Ambulation/Gait Ambulation/Gait assistance: Min guard, Min assist Gait Distance (Feet): 20 Feet x 2 Assistive device: Rolling walker (2 wheels) Gait Pattern/deviations: Step-through pattern, Decreased step length - right, Decreased step length - left, Trunk flexed, Shuffle Gait velocity: decreased     General Gait Details: Slow cadence with short B step length with occasional Min A to prevent posterior LOB   Stairs             Wheelchair Mobility    Modified Rankin (Stroke Patients Only)       Balance Overall balance assessment: Needs assistance Sitting-balance support: Feet supported, Single extremity supported Sitting balance-Leahy Scale: Fair Sitting balance - Comments: Pt able to maintain static sitting at EOB without physical assist Postural control: Posterior lean Standing balance  support: Bilateral upper extremity supported, Reliant on assistive device for balance, During functional activity Standing  balance-Leahy Scale: Poor Standing balance comment: Occasional min A for stability with cues to amb closer to the RW                            Cognition Arousal/Alertness: Awake/alert Behavior During Therapy: [facility] for tasks assessed/performed Overall Cognitive Status: No family/caregiver present to determine baseline cognitive functioning                                          Exercises Other Exercises Other Exercises: Static sitting at EOB with anterior weight shifting to address posterior instability    General Comments        Pertinent Vitals/Pain Pain Assessment Pain Assessment: No/denies pain    Home Living                          Prior Function            PT Goals (current goals can now be found in the care plan section) Progress towards PT goals: Progressing toward goals    Frequency    Min 2X/week      PT Plan Current plan remains appropriate    Co-evaluation              AM-PAC PT "6 Clicks" Mobility   Outcome Measure  Help needed turning from your back to your side while in a flat bed without using bedrails?: A Little Help needed moving from lying on your back to sitting on the side of a flat bed without using bedrails?: A Little Help needed moving to and from a bed to a chair (including a wheelchair)?: A Little Help needed standing up from a chair using your arms (e.g., wheelchair or bedside chair)?: A Little Help needed to walk in hospital room?: A Lot Help needed climbing 3-5 steps with a railing? : Total 6 Click Score: 15    End of Session Equipment Utilized During Treatment: Oxygen Activity Tolerance: Patient tolerated treatment well Patient left: with chair alarm set;with call bell/phone within reach;in chair Nurse Communication: Mobility status;Other (comment) (SpO2 to a low of 83% during ambulation, quickly returned to mid 90s at rest) PT Visit Diagnosis: Muscle weakness (generalized)  (M62.81);Difficulty in walking, not elsewhere classified (R26.2);Unsteadiness on feet (R26.81)     Time: 1962-2297 PT Time Calculation (min) (ACUTE ONLY): 39 min  Charges:  $Gait Training: 8-22 mins $Therapeutic Activity: 23-37 mins                     D. Scott Ilka Lovick PT, DPT 12/29/21, 5:39 PM

## 2021-12-30 DIAGNOSIS — N400 Enlarged prostate without lower urinary tract symptoms: Secondary | ICD-10-CM

## 2021-12-30 DIAGNOSIS — I5032 Chronic diastolic (congestive) heart failure: Secondary | ICD-10-CM

## 2021-12-30 DIAGNOSIS — D631 Anemia in chronic kidney disease: Secondary | ICD-10-CM

## 2021-12-30 LAB — CBC
HCT: 22.2 % — ABNORMAL LOW (ref 39.0–52.0)
Hemoglobin: 7.6 g/dL — ABNORMAL LOW (ref 13.0–17.0)
MCH: 26.9 pg (ref 26.0–34.0)
MCHC: 34.2 g/dL (ref 30.0–36.0)
MCV: 78.4 fL — ABNORMAL LOW (ref 80.0–100.0)
Platelets: 159 10*3/uL (ref 150–400)
RBC: 2.83 MIL/uL — ABNORMAL LOW (ref 4.22–5.81)
RDW: 13.9 % (ref 11.5–15.5)
WBC: 12.7 10*3/uL — ABNORMAL HIGH (ref 4.0–10.5)
nRBC: 0 % (ref 0.0–0.2)

## 2021-12-30 LAB — COMPREHENSIVE METABOLIC PANEL
ALT: 30 U/L (ref 0–44)
AST: 46 U/L — ABNORMAL HIGH (ref 15–41)
Albumin: 2.7 g/dL — ABNORMAL LOW (ref 3.5–5.0)
Alkaline Phosphatase: 54 U/L (ref 38–126)
Anion gap: 11 (ref 5–15)
BUN: 66 mg/dL — ABNORMAL HIGH (ref 8–23)
CO2: 24 mmol/L (ref 22–32)
Calcium: 8 mg/dL — ABNORMAL LOW (ref 8.9–10.3)
Chloride: 96 mmol/L — ABNORMAL LOW (ref 98–111)
Creatinine, Ser: 2.13 mg/dL — ABNORMAL HIGH (ref 0.61–1.24)
GFR, Estimated: 28 mL/min — ABNORMAL LOW (ref >60)
Glucose, Bld: 166 mg/dL — ABNORMAL HIGH (ref 70–99)
Potassium: 4.6 mmol/L (ref 3.5–5.1)
Sodium: 131 mmol/L — ABNORMAL LOW (ref 135–145)
Total Bilirubin: 0.5 mg/dL (ref 0.3–1.2)
Total Protein: 5.5 g/dL — ABNORMAL LOW (ref 6.5–8.1)

## 2021-12-30 LAB — GLUCOSE, CAPILLARY
Glucose-Capillary: 184 mg/dL — ABNORMAL HIGH (ref 70–99)
Glucose-Capillary: 244 mg/dL — ABNORMAL HIGH (ref 70–99)
Glucose-Capillary: 160 mg/dL — ABNORMAL HIGH (ref 70–99)
Glucose-Capillary: 164 mg/dL — ABNORMAL HIGH (ref 70–99)

## 2021-12-30 MED ORDER — INSULIN DETEMIR 100 UNIT/ML ~~LOC~~ SOLN
8.0000 [IU] | Freq: Every day | SUBCUTANEOUS | Status: DC
  Administered 2021-12-30 – 2021-12-31 (×2): 8 [IU] via SUBCUTANEOUS
  Filled 2021-12-30 (×3): qty 0.08

## 2021-12-30 NOTE — Assessment & Plan Note (Addendum)
Hgb trending down  Iron studies normal on 12/19/20.

## 2021-12-30 NOTE — Assessment & Plan Note (Addendum)
POA.  Due to trauma with falls vs possibly Covid-19.   Initial CK 602 >> 5867>> 7933 > 3000 Resolving, fluids stopped

## 2021-12-30 NOTE — Assessment & Plan Note (Signed)
Due to CKD ?

## 2021-12-30 NOTE — TOC Progression Note (Signed)
Transition of Care [facility]) - Progression Note    Patient Details  Name: Jay Mathis MRN: [id number] Date of Birth: 1924/04/30  [facility]) CM/SW [place], LCSW Phone Number: 12/30/2021, 1:10 PM  Clinical Narrative:   Per rounds, patient is appropriate for SNF for rehab. Not hospice.  CSW attempted call to son Fritz Pickerel to discuss. No answer, left VM requesting a return call.  Patient would not be able to go to SNF until 10 days after positive COVID test.    Expected Discharge Plan:  (TBD) Barriers to Discharge: Continued Medical Work up  Expected Discharge Plan and Services Expected Discharge Plan:  (TBD)   Discharge Planning Services: CM Consult Post Acute Care Choice: NA Living arrangements for the past 2 months: Single Family Home                                       Social Determinants of Health (SDOH) Interventions    Readmission Risk Interventions No flowsheet data found.

## 2021-12-30 NOTE — Assessment & Plan Note (Addendum)
Renal function appears at baseline.  Cr on admission 2.36, stable here

## 2021-12-30 NOTE — Assessment & Plan Note (Signed)
Follows with urology for monthly exchanges.  Foley was replaced on admission in the ED on 1/15.

## 2021-12-30 NOTE — Assessment & Plan Note (Addendum)
Completed 5 days remdesivir and steroids, weaned off O2

## 2021-12-30 NOTE — Progress Notes (Signed)
Progress Note   Patient: Jay Mathis PFX:[id number] DOB: 10/27/1924 DOA: 12/24/2021     5 DOS: the patient was seen and examined on 12/30/2021   Brief hospital course: Jay Mathis is a 88 y.o. M w/ CAD, urinary retention with indwelling Foley catheter since August 2020 (follows with urologist Dr. Diamantina Providence), CAD s/p CABG, IDA, NIDDM, and CKD IV baseline Cr 1.8-2.8 who presented with altered mental status, weakness, and fall.  In the ER, patient was altered, with tachycardia, tachypnea, lactic acidosis and confusion.  COVID+.  Foley was exchanged in the ED.  Patient was started on empiric IV Rocephin pending urine cultures.  CK, initially 602 >> 5867 the next morning.  Assessment and Plan * Acute respiratory failure with hypoxia (HCC) Required as much as 60 L on heated high flow nasal cannula, subsequently weaned down.  Likely due to COVID versus aspiration.  Seen to have difficulty swallowing coffee, now seems stable. -- SLP consult for swallow evaluation   -- Continue Unasyn -- Pulmonary hygiene  COVID-19 virus detected- (present on admission)  Remdesivir completed, O2 weaned off, will stop steroids. -Continue steroids  Pyuria- (present on admission) POA, UA with large Hbg Urine culture with more than 100,000 colonies multiple species, suggest recollection.  Suspect acute illness more related to Covid than UTI UTI ruled out  Sepsis ([place])- (present on admission) POA with tachypnea, tachycardia and decreased mentation confusion  Sepsis due to COVID  Viral sepsis  Acute metabolic encephalopathy- (present on admission) POA, at baseline, patient lives independently, alone, no cognitive impairment.  Here he was confused, and disoriented. Seemed to be hallucinating during encounter this time.  Remains severely confused, disoriented, likely COVID encephalopathy. --Delirium precautions   Hypokalemia Resolved with replacement.     Right inguinal hernia No acute issues. Monitor.    Rib fracture- (present on admission) CT on admission noted a posterior 12th LEFT rib fracture of indeterminate age.  Possibly sustained in recent falls vs more remote.   --Supportive care --Pain control PRN --Incentive spirometrer  Chronic indwelling Foley catheter Follows with urology for monthly exchanges.  Foley was replaced on admission in the ED on 1/15.  Chronic diastolic CHF (congestive heart failure) ([place])- (present on admission) Stable, pt euvolemic to slightly dry. Echo 10/08/2021: EF 60 to 32%, grade 2 diastolic dysfunction. --Continue home Coreg --Hold lasix  Rhabdomyolysis- (present on admission) POA.  Due to trauma with falls vs possibly Covid-19.   Initial CK 602 >> 5867>> 7933 > 3000 -Hold fluids  Essential hypertension- (present on admission) BP stable --Continue Coreg, amlodipine  Secondary hyperparathyroidism of renal origin ([place])- (present on admission) Due to CKD.   Hyponatremia Na stable -Hold fluids  Anemia in stage 4 chronic kidney disease ([place])- (present on admission) Basleine 10, trending down here. - Trend CBC    CKD (chronic kidney disease) stage 4, GFR 15-29 ml/min (HCC)- (present on admission) Renal function appears at baseline.  Cr on admission 2.36  Iron deficiency anemia- (present on admission) Hbg stable.  Iron studies normal on 12/19/20.     BPH (benign prostatic hyperplasia)- (present on admission) With chronic urinary retention and indwelling Foley since August 2020. Follows with Dr. Diamantina Providence. Foley replaced in ED 1/15  CAD (coronary artery disease)- (present on admission) -Hold atorvastatin given Rhabdo -Continue Coreg   GERD (gastroesophageal reflux disease) Continue PPI  Type 2 diabetes mellitus with stage 4 chronic kidney disease and hyperglycemia Uncontrolled.  Last A1c 9.7%. Glucose elevated -Hold glipizide -Continue Sliding scale Novolog - Start  levelmir - Continue linagliptin    HLD (hyperlipidemia) Hold  Lipitor with rhabdomyolysis.  Transaminitis-resolved as of 12/29/2021, (present on admission) ?due to rhabdo.  Improving. Monitor closely   [facility] as of 12/29/2021 Pt had two falls at home, due to profound weakness in setting of infections.  Ambulates with a walker.   --PT / OT evaluations --Fall precautions --Treat infections --Optimize nutrition     Subjective: No fever.  Remains confused.  No respiratory distress  Objective Vital signs were reviewed and unremarkable.  Weaned to room air.  Blood pressure slightly elevated. Elderly adult male, sitting up in recliner, no acute distress.  Very hard of hearing, psychomotor slowing noted, disoriented.  Respiratory rate normal, lungs clear without rales or wheezes, abdomen soft, no arrhythmias, no murmurs, no lower extremity edema  Data Reviewed:  My review of laboratory studies and imaging is notable for white blood cell count down to 12, hemoglobin down to 7, sodium 131, no change, creatinine down to 2.1, AST slightly elevated, no change  Family Communication: called Son Jay Mathis, no answer  Disposition: Status is: Inpatient  Remains inpatient appropriate because: The patient has COVID with encephalopathy and will require significant rehabilitation return to living independently.  He will need 10 days quarantine, after that can be discharged to SNF        Author: Edwin Dada, MD 12/30/2021 3:38 PM  For on call review [email or web address].

## 2021-12-30 NOTE — Assessment & Plan Note (Addendum)
Basleine 10, trending down here.  Iron replete earlier this month - Check CBC tomorrow

## 2021-12-30 NOTE — Assessment & Plan Note (Signed)
POA with tachypnea, tachycardia and decreased mentation confusion  Sepsis due to COVID  Viral sepsis

## 2021-12-30 NOTE — Assessment & Plan Note (Addendum)
Patient admitted with COVID and bilateral infiltrates.  He did require high flow nasal cannula for about 24 hours, subsequently weaned to room air.   SLP ocnsulted, mostly cognitive based dysphagia, on minced diet until mental status improves.

## 2021-12-30 NOTE — Assessment & Plan Note (Signed)
With chronic urinary retention and indwelling Foley since August 2020. Follows with Dr. Diamantina Providence. Foley replaced in ED 1/15

## 2021-12-30 NOTE — Assessment & Plan Note (Signed)
Na stable -Hold fluids

## 2021-12-30 NOTE — Assessment & Plan Note (Addendum)
Incidental on admission CT, age indeterminate, asymptomatic here

## 2021-12-30 NOTE — Assessment & Plan Note (Signed)
POA, at baseline, patient lives independently, alone, no cognitive impairment.  Here he was confused, and disoriented. Seemed to be hallucinating during encounter this time.  Remains severely confused, disoriented, likely COVID encephalopathy. --Delirium precautions

## 2021-12-30 NOTE — Progress Notes (Signed)
Patient weaned to room air at 900. Patient currently has no signs of distress, O2 saturation 94% on room air.

## 2021-12-30 NOTE — Assessment & Plan Note (Signed)
Stable, pt euvolemic to slightly dry. Echo 10/08/2021: EF 60 to 30%, grade 2 diastolic dysfunction. --Continue home Coreg --Hold lasix

## 2021-12-30 NOTE — Assessment & Plan Note (Signed)
-  Hold atorvastatin given Rhabdo -Continue Coreg

## 2021-12-30 NOTE — Assessment & Plan Note (Signed)
Resolved with replacement.

## 2021-12-30 NOTE — Assessment & Plan Note (Signed)
POA, UA with large Hbg Urine culture with more than 100,000 colonies multiple species, suggest recollection.  Suspect acute illness more related to Covid than UTI UTI ruled out

## 2021-12-30 NOTE — Assessment & Plan Note (Signed)
BP stable --Continue Coreg, amlodipine

## 2021-12-30 NOTE — Assessment & Plan Note (Addendum)
Uncontrolled.  Last A1c 9.7%. Glucose improved - Hold glipizide - Continue Sliding scale Novolog - Continue levemir - Continue linagliptin given COVID, d/c on discharge

## 2021-12-31 DIAGNOSIS — J690 Pneumonitis due to inhalation of food and vomit: Secondary | ICD-10-CM

## 2021-12-31 LAB — GLUCOSE, CAPILLARY
Glucose-Capillary: 74 mg/dL (ref 70–99)
Glucose-Capillary: 157 mg/dL — ABNORMAL HIGH (ref 70–99)
Glucose-Capillary: 103 mg/dL — ABNORMAL HIGH (ref 70–99)

## 2021-12-31 NOTE — Assessment & Plan Note (Signed)
-  Continue unasyn, day 5 of 5

## 2021-12-31 NOTE — Progress Notes (Signed)
Progress Note   Patient: Jay Mathis Jay Mathis:[id number] DOB: 06-03-1924 DOA: 12/24/2021     6 DOS: the patient was seen and examined on 12/31/2021   Brief Mathis course: Mr. Jay Mathis is a 88 y.o. M w/ CAD, urinary retention with indwelling Foley catheter since August 2020 (follows with urologist Dr. Diamantina Mathis), CAD s/p CABG, IDA, NIDDM, and CKD IV baseline Cr 1.8-2.8 who presented with altered mental status, weakness, and fall.  In the ER, patient was altered, with tachycardia, tachypnea, lactic acidosis and confusion.  COVID+.  Foley was exchanged in the ED.  Patient was started on empiric IV Rocephin pending urine cultures.  CK, initially 602 >> 5867 the next morning.  Assessment and Plan * Acute respiratory failure with hypoxia Jay Mathis) Patient admitted with COVID and bilateral infiltrates.  He did require high flow nasal cannula for about 24 hours, subsequently weaned to room air.   SLP ocnsulted, mostly cognitive based dysphagia, on minced diet until mental status improves.       Aspiration pneumonia (Jay Mathis) -Continue unasyn, day 5 of 5  COVID-19 virus detected- (present on admission) Completed 5 days remdesivir and steroids, weaned off O2  Pyuria- (present on admission) POA, UA with large Hbg Urine culture with more than 100,000 colonies multiple species, suggest recollection.  Suspect acute illness more related to Covid than UTI UTI ruled out  Sepsis (Jay Mathis)- (present on admission) POA with tachypnea, tachycardia and decreased mentation confusion  Sepsis due to COVID  Viral sepsis  Acute metabolic encephalopathy- (present on admission) POA, at baseline, patient lives independently, alone, no cognitive impairment.  Here he was confused, and disoriented. Seemed to be hallucinating during encounter this time.  Remains severely confused, disoriented, likely COVID encephalopathy. --Delirium precautions   Hypokalemia Resolved with replacement.     Right inguinal hernia No acute  issues. Monitor.   Rib fracture- (present on admission) Incidental on admission CT, age indeterminate, asymptomatic here  Chronic indwelling Foley catheter Follows with urology for monthly exchanges.  Foley was replaced on admission in the ED on 1/15.  Chronic diastolic CHF (congestive heart failure) (Jay Mathis)- (present on admission) Stable, pt euvolemic to slightly dry. Echo 10/08/2021: EF 60 to 50%, grade 2 diastolic dysfunction. --Continue home Coreg --Hold lasix  Rhabdomyolysis- (present on admission) POA.  Due to trauma with falls vs possibly Covid-19.   Initial CK 602 >> 5867>> 7933 > 3000 Resolving, fluids stopped  Essential hypertension- (present on admission) BP stable --Continue Coreg, amlodipine  Secondary hyperparathyroidism of renal origin (Jay Mathis)- (present on admission) Due to CKD.   Hyponatremia Na stable -Hold fluids  Anemia in stage 4 chronic kidney disease (Jay Mathis)- (present on admission) Basleine 10, trending down here.  Iron replete earlier this month - Check CBC tomorrow    CKD (chronic kidney disease) stage 4, GFR 15-29 ml/min (HCC)- (present on admission) Renal function appears at baseline.  Cr on admission 2.36, stable here  Iron deficiency anemia- (present on admission) Hgb trending down  Iron studies normal on 12/19/20.     BPH (benign prostatic hyperplasia)- (present on admission) With chronic urinary retention and indwelling Foley since August 2020. Follows with Dr. Diamantina Mathis. Foley replaced in ED 1/15  CAD (coronary artery disease)- (present on admission) -Hold atorvastatin given Rhabdo -Continue Coreg   GERD (gastroesophageal reflux disease) Continue PPI  Type 2 diabetes mellitus with stage 4 chronic kidney disease and hyperglycemia Uncontrolled.  Last A1c 9.7%. Glucose improved - Hold glipizide - Continue Sliding scale Novolog - Continue levemir - Continue linagliptin  given COVID, d/c on discharge    HLD (hyperlipidemia) Hold Lipitor  with rhabdomyolysis.  Transaminitis-resolved as of 12/29/2021, (present on admission) ?due to rhabdo.  Improving. Monitor closely   [facility] as of 12/29/2021 Pt had two falls at home, due to profound weakness in setting of infections.  Ambulates with a walker.   --PT / OT evaluations --Fall precautions --Treat infections --Optimize nutrition     Subjective: Patient still pleasantly confused, still off oxygen, no new fever, respiratory distress, agitation.  Objective Vital signs were reviewed and unremarkable. Elderly adult male, sitting up in recliner, very sleepy.  Very hard of hearing.  Responses slowed, oriented to self and son and Mathis, not year or situation.  Heart rate with soft systolic murmur, no lower extremity edema, lung sounds clear, normal respiratory effort, no rales or wheezes.  Abdomen soft without tenderness palpation or guarding, no ascites or distention, speech fluent, face symmetric, upper extremity severely generalized weakness but symmetric strength.      Data Reviewed:  There are no new results to review at this time.  Family Communication: Son at the bedside  Disposition: Status is: Inpatient  Remains inpatient appropriate because: He has significant encephalopathy and weakness from his COVID.  From standpoint of COVID treatment, further remdesivir steroids unlikely to improve encephalopathy.  He will need significant rehabilitation return to his prior level of function.  Had a long discussion with son, it may be that the patient is able to recover from this, in which case transition to rehab on 1026 when his COVID isolation is over is his best chance.  Would recommend palliative to follow-up at that time, and based patient's mentation, functional status, and p.o. intake deteriorate and he would need to transition to hospice.  Certainly at this time the patient is able to ambulate with a walker and physical therapy out to the hall, and seems  a good PT candidate.  However obviously if he deteriorated here could transition to hospice sooner.          Author: Edwin Dada, MD 12/31/2021 4:33 PM  For on call review [email or web address].

## 2022-01-01 ENCOUNTER — Inpatient Hospital Stay: Payer: Medicare HMO

## 2022-01-01 LAB — CBC WITH DIFFERENTIAL/PLATELET
Abs Immature Granulocytes: 0.36 10*3/uL — ABNORMAL HIGH (ref 0.00–0.07)
Abs Immature Granulocytes: 0.48 10*3/uL — ABNORMAL HIGH (ref 0.00–0.07)
Basophils Absolute: 0 10*3/uL (ref 0.0–0.1)
Basophils Absolute: 0 10*3/uL (ref 0.0–0.1)
Basophils Relative: 0 %
Basophils Relative: 0 %
Eosinophils Absolute: 0 10*3/uL (ref 0.0–0.5)
Eosinophils Absolute: 0 10*3/uL (ref 0.0–0.5)
Eosinophils Relative: 0 %
Eosinophils Relative: 0 %
HCT: 14.2 % — ABNORMAL LOW (ref 39.0–52.0)
HCT: 16 % — ABNORMAL LOW (ref 39.0–52.0)
Hemoglobin: 4.9 g/dL — CL (ref 13.0–17.0)
Hemoglobin: 5.5 g/dL — ABNORMAL LOW (ref 13.0–17.0)
Immature Granulocytes: 3 %
Immature Granulocytes: 3 %
Lymphocytes Relative: 12 %
Lymphocytes Relative: 13 %
Lymphs Abs: 1.4 10*3/uL (ref 0.7–4.0)
Lymphs Abs: 1.9 10*3/uL (ref 0.7–4.0)
MCH: 27.2 pg (ref 26.0–34.0)
MCH: 27 pg (ref 26.0–34.0)
MCHC: 34.5 g/dL (ref 30.0–36.0)
MCHC: 34.4 g/dL (ref 30.0–36.0)
MCV: 78.9 fL — ABNORMAL LOW (ref 80.0–100.0)
MCV: 78.4 fL — ABNORMAL LOW (ref 80.0–100.0)
Monocytes Absolute: 1.7 10*3/uL — ABNORMAL HIGH (ref 0.1–1.0)
Monocytes Absolute: 2.1 10*3/uL — ABNORMAL HIGH (ref 0.1–1.0)
Monocytes Relative: 15 %
Monocytes Relative: 14 %
Neutro Abs: 8 10*3/uL — ABNORMAL HIGH (ref 1.7–7.7)
Neutro Abs: 10.4 10*3/uL — ABNORMAL HIGH (ref 1.7–7.7)
Neutrophils Relative %: 70 %
Neutrophils Relative %: 70 %
Platelets: 126 10*3/uL — ABNORMAL LOW (ref 150–400)
Platelets: 152 10*3/uL (ref 150–400)
RBC: 1.8 MIL/uL — ABNORMAL LOW (ref 4.22–5.81)
RBC: 2.04 MIL/uL — ABNORMAL LOW (ref 4.22–5.81)
RDW: 14.2 % (ref 11.5–15.5)
RDW: 14.1 % (ref 11.5–15.5)
WBC: 11.5 10*3/uL — ABNORMAL HIGH (ref 4.0–10.5)
WBC: 14.9 10*3/uL — ABNORMAL HIGH (ref 4.0–10.5)
nRBC: 0 % (ref 0.0–0.2)
nRBC: 0.3 % — ABNORMAL HIGH (ref 0.0–0.2)

## 2022-01-01 LAB — GLUCOSE, CAPILLARY
Glucose-Capillary: 109 mg/dL — ABNORMAL HIGH (ref 70–99)
Glucose-Capillary: 47 mg/dL — ABNORMAL LOW (ref 70–99)
Glucose-Capillary: 69 mg/dL — ABNORMAL LOW (ref 70–99)
Glucose-Capillary: 98 mg/dL (ref 70–99)
Glucose-Capillary: 160 mg/dL — ABNORMAL HIGH (ref 70–99)
Glucose-Capillary: 197 mg/dL — ABNORMAL HIGH (ref 70–99)
Glucose-Capillary: 209 mg/dL — ABNORMAL HIGH (ref 70–99)

## 2022-01-01 LAB — BASIC METABOLIC PANEL
Anion gap: 6 (ref 5–15)
BUN: 61 mg/dL — ABNORMAL HIGH (ref 8–23)
CO2: 25 mmol/L (ref 22–32)
Calcium: 7.8 mg/dL — ABNORMAL LOW (ref 8.9–10.3)
Chloride: 100 mmol/L (ref 98–111)
Creatinine, Ser: 2.11 mg/dL — ABNORMAL HIGH (ref 0.61–1.24)
GFR, Estimated: 28 mL/min — ABNORMAL LOW (ref >60)
Glucose, Bld: 54 mg/dL — ABNORMAL LOW (ref 70–99)
Potassium: 3.9 mmol/L (ref 3.5–5.1)
Sodium: 131 mmol/L — ABNORMAL LOW (ref 135–145)

## 2022-01-01 LAB — IRON AND TIBC
Iron: 42 ug/dL — ABNORMAL LOW (ref 45–182)
Saturation Ratios: 24 % (ref 17.9–39.5)
TIBC: 172 ug/dL — ABNORMAL LOW (ref 250–450)
UIBC: 130 ug/dL

## 2022-01-01 LAB — CBC
HCT: 13.9 % — CL (ref 39.0–52.0)
Hemoglobin: 4.8 g/dL — CL (ref 13.0–17.0)
MCH: 26.5 pg (ref 26.0–34.0)
MCHC: 34.5 g/dL (ref 30.0–36.0)
MCV: 76.8 fL — ABNORMAL LOW (ref 80.0–100.0)
Platelets: 132 10*3/uL — ABNORMAL LOW (ref 150–400)
RBC: 1.81 MIL/uL — ABNORMAL LOW (ref 4.22–5.81)
RDW: 14 % (ref 11.5–15.5)
WBC: 11.5 10*3/uL — ABNORMAL HIGH (ref 4.0–10.5)
nRBC: 0.3 % — ABNORMAL HIGH (ref 0.0–0.2)

## 2022-01-01 LAB — HEMOGLOBIN AND HEMATOCRIT, BLOOD
HCT: 22.6 % — ABNORMAL LOW (ref 39.0–52.0)
Hemoglobin: 8 g/dL — ABNORMAL LOW (ref 13.0–17.0)

## 2022-01-01 LAB — PREPARE RBC (CROSSMATCH)

## 2022-01-01 LAB — ABO/RH: ABO/RH(D): A POS

## 2022-01-01 LAB — OCCULT BLOOD X 1 CARD TO LAB, STOOL: Fecal Occult Bld: NEGATIVE

## 2022-01-01 MED ORDER — OXYCODONE HCL 5 MG PO TABS
5.0000 mg | ORAL_TABLET | Freq: Four times a day (QID) | ORAL | Status: DC | PRN
  Administered 2022-01-02: 21:00:00 5 mg via ORAL
  Filled 2022-01-01: qty 1

## 2022-01-01 MED ORDER — HYDROMORPHONE HCL 1 MG/ML IJ SOLN
0.5000 mg | INTRAMUSCULAR | Status: DC | PRN
Start: 2022-01-01 — End: 2022-01-05

## 2022-01-01 MED ORDER — INSULIN ASPART 100 UNIT/ML IJ SOLN
0.0000 [IU] | Freq: Three times a day (TID) | INTRAMUSCULAR | Status: DC
  Administered 2022-01-01: 2 [IU] via SUBCUTANEOUS
  Administered 2022-01-02 (×2): 3 [IU] via SUBCUTANEOUS
  Administered 2022-01-02: 09:00:00 2 [IU] via SUBCUTANEOUS
  Administered 2022-01-03: 10:00:00 3 [IU] via SUBCUTANEOUS
  Administered 2022-01-03: 18:00:00 2 [IU] via SUBCUTANEOUS
  Administered 2022-01-03: 14:00:00 3 [IU] via SUBCUTANEOUS
  Administered 2022-01-04 – 2022-01-05 (×3): 2 [IU] via SUBCUTANEOUS
  Filled 2022-01-01 (×9): qty 1

## 2022-01-01 MED ORDER — SODIUM CHLORIDE 0.9% IV SOLUTION: Freq: Once | INTRAVENOUS | Status: AC

## 2022-01-01 MED ORDER — POLYETHYLENE GLYCOL 3350 17 G PO PACK
17.0000 g | PACK | Freq: Every day | ORAL | Status: DC
  Administered 2022-01-03 – 2022-01-05 (×3): 17 g via ORAL
  Filled 2022-01-01 (×4): qty 1

## 2022-01-01 MED ORDER — SENNA 8.6 MG PO TABS
1.0000 | ORAL_TABLET | Freq: Two times a day (BID) | ORAL | Status: DC
  Administered 2022-01-01 – 2022-01-05 (×5): 8.6 mg via ORAL
  Filled 2022-01-01 (×6): qty 1

## 2022-01-01 NOTE — Progress Notes (Signed)
OT Cancellation Note  Patient Details Name: Jay Mathis MRN: [id number] DOB: 11-18-1924   Cancelled Treatment:    Reason Eval/Treat Not Completed: Medical issues which prohibited therapy. Chart reviewed. Pt noted with Hgb 4.8 then 5.5 this morning, currently getting blood transfusing. Will hold OT tx this date and re-attempt at later date/time as medically appropriate.   Ardeth Perfect., MPH, MS, OTR/L ascom [phone] 01/01/22, 11:35 AM

## 2022-01-01 NOTE — Progress Notes (Signed)
Inpatient Diabetes Program Recommendations  AACE/ADA: New Consensus Statement on Inpatient Glycemic Control   Target Ranges:  Prepandial:   less than 140 mg/dL      Peak postprandial:   less than 180 mg/dL (1-2 hours)      Critically ill patients:  140 - 180 mg/dL    Latest Reference Range & Units 01/01/22 06:10  Glucose 70 - 99 mg/dL 54 (L)    Latest Reference Range & Units 12/31/21 08:54 12/31/21 13:12 12/31/21 17:52  Glucose-Capillary 70 - 99 mg/dL 74 157 (H) 103 (H)   Review of Glycemic Control  Diabetes history: DM2 Outpatient Diabetes medications: Amaryl 2 mg daily Current orders for Inpatient glycemic control: Levemir 8 units QHS, Novolog 0-15 units TID with meals, Novolog 0-5 units QHS; Tradjenta 5 units daily  Inpatient Diabetes Program Recommendations:    Insulin: Lab glucose 54 mg/dl today. Please discontinue Levemir and decrease Novolog correction to Novolog 0-9 units TID with meals.  Thanks, Barnie Alderman, RN, MSN, CDE Diabetes Coordinator Inpatient Diabetes Program [phone] (Team Pager from 8am to 5pm)

## 2022-01-01 NOTE — TOC Progression Note (Signed)
Transition of Care [facility]) - Progression Note    Patient Details  Name: KELECHI ORGERON MRN: [id number] Date of Birth: 1923/12/14  Transition of Care [facility]) CM/SW [place], [place] Phone Number: 01/01/2022, 4:34 PM  Clinical Narrative:     CSW spoke with patient's son [name] regarding dispo, he reports if patient looks like he's going to improve and get a lot better then he agrees to SNF. but if we dont anticipate much improvement he'd rather he come home as he knows patient will be more comfortable at home with hospice.   CSW has informed MD of the above, plan to see how patient does medically in the next 1-2 days to determine appropriate dispo.    Expected Discharge Plan:  (TBD) Barriers to Discharge: Continued Medical Work up  Expected Discharge Plan and Services Expected Discharge Plan:  (TBD)   Discharge Planning Services: CM Consult Post Acute Care Choice: NA Living arrangements for the past 2 months: Single Family Home                                       Social Determinants of Health (SDOH) Interventions    Readmission Risk Interventions No flowsheet data found.

## 2022-01-01 NOTE — Progress Notes (Signed)
Patients FSBS 47.  Patient is alert and speaking.  Orange juice given and recheck of FSBS is 69.  Will give another orange juice and recheck.  Dr. Tawanna Solo notified.  Will monitor.

## 2022-01-01 NOTE — [place] Notes (Addendum)
PROGRESS NOTE    Jay Mathis  JIR:[id number] DOB: 08/15/1924 DOA: 12/24/2021 PCP: [name], MD   Chief Complain: Altered mental status, weakness, fall  Brief Narrative: Patient is a 88 y.o. M w/ CAD, urinary retention with indwelling Foley catheter since August 2020 (follows with urologist Dr. Diamantina Providence), CAD s/p CABG, IDA, NIDDM, and CKD IV baseline Cr 1.8-2.8 who presented with altered mental status, weakness, and fall.In the ER, patient was altered, with tachycardia, tachypnea,had  lactic acidosis and confusion.  COVID+.  He required high flow nasal cannula for about 24 hours, subsequently weaned.  He was also found to have aspiration pneumonia and was started on antibiotics.  PT/OT recommending skilled nursing facility on discharge.  Hospital course remarkable for dropping of hemoglobin to the range of 5, unknown etiology, being given 2 units of blood transfusion.  Assessment & Plan:   Principal Problem:   Acute respiratory failure with hypoxia (HCC) Active Problems:   Sepsis ([place])   Pyuria   Type 2 diabetes mellitus with stage 4 chronic kidney disease and hyperglycemia   CAD (coronary artery disease)   BPH (benign prostatic hyperplasia)   Iron deficiency anemia   CKD (chronic kidney disease) stage 4, GFR 15-29 ml/min (HCC)   Anemia in stage 4 chronic kidney disease (HCC)   Hyponatremia   Secondary hyperparathyroidism of renal origin ([place])   Essential hypertension   Acute metabolic encephalopathy   Rhabdomyolysis   Chronic diastolic CHF (congestive heart failure) ([place])   Chronic indwelling Foley catheter   COVID-19 virus detected   Rib fracture   Hypokalemia   Aspiration pneumonia ([place])   Acute respiratory failure with hypoxia  Patient admitted with COVID and bilateral infiltrates.  He did require high flow nasal cannula for about 24 hours, on 2L.min.   Aspiration pneumonia  -Completed Abx course -SLP was following,on dysphagia 2 diet    COVID-19 virus   Completed 5 days remdesivir and steroids, weaned off O2  Microcytic anemia/history of iron deficiency anemia Hemoglobin acutely dropped to the range of 5 from   7.6 yesterday.  Looks to have slowly dropping from the range of 9-10. Unknown etiology.  No report of hematochezia or melena or any source of acute bleeding.  We will check FOBT.  Given 2 units of PRBC today.  Check CBC tomorrow.  Patient is hemodynamically stable.  Complains of abdominal pain.  Will check CT abdomen/pelvis to rule out abdominal wall or retroperitoneal bleeding   Pyuria POA, UA with large Hbg Urine culture with more than 100,000 colonies multiple species, suggest recollection.  Suspect acute illness more related to Covid than UTI UTI ruled out   Sepsis  POA with tachypnea, tachycardia and decreased mentation confusion  Sepsis due to COVID .  Sepsis physiology has resolved   Acute metabolic encephalopathy POA, at baseline, patient lives independently, alone, no cognitive impairment.  Here he was confused, and disoriented.  Remains severely confused, disoriented, likely COVID encephalopathy. --Delirium precautions    Right inguinal hernia No acute issues. Monitor.    Rib fracture Incidental on admission CT, age indeterminate, asymptomatic here   Chronic indwelling Foley catheter Follows with urology for monthly exchanges.  Foley was replaced on admission in the ED on 1/15.   Chronic diastolic CHF Stable, pt euvolemic to slightly dry. Echo 10/08/2021: EF 60 to 75%, grade 2 diastolic dysfunction. Continue home Coreg Hold lasix   Rhabdomyolysis POA.  Due to trauma with falls vs possibly Covid-19.   Initial CK 602 >> 5867>>  7933 > 3000 Resolving, fluids stopped   Essential hypertension BP stable --Continue Coreg, amlodipine   Secondary hyperparathyroidism of renal origin Due to CKD.     CKD (chronic kidney disease) stage 4 Renal function appears at baseline.  Cr on admission 2.36, stable here     BPH  With chronic urinary retention and indwelling Foley since August 2020. Follows with Dr. Diamantina Providence. Foley replaced in ED 1/15   CAD  -Hold atorvastatin given Rhabdo -Continue Coreg    GERD  Continue PPI   Type 2 diabetes mellitus with stage 4 chronic kidney disease and hyperglycemia Uncontrolled.  Last A1c 9.7%. Glucose improved - Hold glipizide - Continue Sliding scale Novolog - Continue levemir - Continue linagliptin given COVID, d/c on discharge    HLD Hold Lipitor with rhabdomyolysis.   Debility/deconditioning  PT/OT recommending SNF on discharge  Addendum:  CT abd /pelvis showed 17.6 x 7.5 x 6.2 cm left rectus sheath hematoma. ,smaller left psoas muscle and right iliacus muscle hematomas. We have held anticoagulation.He is being transfused with 2 units of PRBC.We will continue monitoring H and H and transfuse for Hb less than 7.We will continue pain management.As per my discussion with general surgery in the past for management of rectus sheath hematomas,there is no role of surgical management.Patient is hemodynamically stable.        DVT prophylaxis:SCD Code Status: DNR Family Communication: Son at bedside Patient status:Inpatient  Dispo: The patient is from: Home              Anticipated d/c is to: SNF              Anticipated d/c date is: Needs to finish isolation for COVID before discharge to rehab  Consultants: None  Procedures:None  Antimicrobials:  Anti-infectives (From admission, onward)    Start     Dose/Rate Route Frequency Ordered Stop   12/27/21 1000  Ampicillin-Sulbactam (UNASYN) 3 g in sodium chloride 0.9 % 100 mL IVPB        3 g 200 mL/hr over 30 Minutes Intravenous Every 12 hours 12/26/21 1410 12/31/21 2245   12/26/21 1000  remdesivir 100 mg in sodium chloride 0.9 % 100 mL IVPB       See Hyperspace for full Linked Orders Report.   100 mg 200 mL/hr over 30 Minutes Intravenous Daily 12/25/21 1706 12/29/21 0944   12/25/21 1830   remdesivir 200 mg in sodium chloride 0.9% 250 mL IVPB       See Hyperspace for full Linked Orders Report.   200 mg 580 mL/hr over 30 Minutes Intravenous  Once 12/25/21 1706 12/25/21 2122   12/25/21 1000  cefTRIAXone (ROCEPHIN) 2 g in sodium chloride 0.9 % 100 mL IVPB        2 g 200 mL/hr over 30 Minutes Intravenous Every 24 hours 12/24/21 2238 12/26/21 1259   12/24/21 2200  cefTRIAXone (ROCEPHIN) 1 g in sodium chloride 0.9 % 100 mL IVPB        1 g 200 mL/hr over 30 Minutes Intravenous  Once 12/24/21 2159 12/24/21 2321       Subjective: Patient seen and examined at the bedside this afternoon.  Son at the bedside.  He was having some cough.  On 2 L of oxygen/Minute.  Does not look in distress.  As per sound, he was complaining of some abdominal discomfort.  He had a bowel movement while I was in the room, it was brown  Objective: Vitals:   12/31/21 2025 01/01/22 0944  01/01/22 1210 01/01/22 1230  BP: (!) 134/56 (!) 142/51 (!) 126/56 (!) 116/52  Pulse: 69 (!) 59 68 64  Resp: 16 16 16 16   Temp: 98.6 F (37 C) 97.9 F (36.6 C) (!) 97.4 F (36.3 C) (!) 97.4 F (36.3 C)  TempSrc: Oral  Oral Oral  SpO2: 95% 90% 91% 96%  Weight:      Height:        Intake/Output Summary (Last 24 hours) at 01/01/2022 1305 Last data filed at 12/31/2021 1500 Gross per 24 hour  Intake 679.18 ml  Output 600 ml  Net 79.18 ml   Filed Weights   12/24/21 1826 12/26/21 0359  Weight: 83.9 kg 63 kg    Examination:  General exam: Chronically ill looking, very weak, deconditioned, pale HEENT: PERRL Respiratory system: Bilateral coarse breath sounds, diminished air sounds Cardiovascular system: S1 & S2 heard, RRR.  Gastrointestinal system: Abdomen is mildly distended, soft and  has some generalized tenderness.  Bowel sounds present Central nervous system: Alert and awake, not oriented, hard of hearing  extremities: No edema, no clubbing ,no cyanosis Skin: No rashes, no ulcers,no icterus  GU: Right-sided  inguinal hernia, Foley catheter    Data Reviewed: I have personally reviewed following labs and imaging studies  CBC: Recent Labs  Lab 12/27/21 0723 12/28/21 0727 12/29/21 0741 12/30/21 0624 01/01/22 0610 01/01/22 0924  WBC 15.8* 17.1* 13.9* 12.7* 11.5*   11.5* 14.9*  NEUTROABS 14.0* 15.4* 12.4*  --  8.0* 10.4*  HGB 10.5* 9.6* 8.9* 7.6* 4.9*   4.8* 5.5*  HCT 30.8* 28.0* 26.0* 22.2* 14.2*   13.9* 16.0*  MCV 78.8* 79.8* 79.0* 78.4* 78.9*   76.8* 78.4*  PLT 149* 151 156 159 126*   132* 370   Basic Metabolic Panel: Recent Labs  Lab 12/26/21 0550 12/27/21 0723 12/28/21 0727 12/29/21 0741 12/30/21 0624 01/01/22 0610  NA 130* 127* 130* 131* 131* 131*  K 4.2 4.9 5.3* 4.1 4.6 3.9  CL 94* 91* 98 99 96* 100  CO2 23 25 25 23 24 25   GLUCOSE 164* 225* 198* 213* 166* 54*  BUN 33* 60* 73* 67* 66* 61*  CREATININE 1.85* 2.40* 2.48* 2.28* 2.13* 2.11*  CALCIUM 8.2* 8.2* 8.0* 7.7* 8.0* 7.8*  MG 1.7 1.7  --   --   --   --   PHOS  --  5.7*  --   --   --   --    GFR: Estimated Creatinine Clearance: 18.7 mL/min (A) (by C-G formula based on SCr of 2.11 mg/dL (H)). Liver Function Tests: Recent Labs  Lab 12/26/21 0550 12/27/21 0723 12/28/21 0727 12/29/21 0741 12/30/21 0624  AST 164* 109* 76* 56* 46*  ALT 45* 41 34 32 30  ALKPHOS 84 69 63 59 54  BILITOT 0.4 0.5 0.6 0.5 0.5  PROT 6.4* 5.8* 5.6* 5.6* 5.5*  ALBUMIN 2.7* 2.7* 2.5* 2.6* 2.7*   No results for input(s): LIPASE, AMYLASE in the last 168 hours. No results for input(s): AMMONIA in the last 168 hours. Coagulation Profile: No results for input(s): INR, PROTIME in the last 168 hours. Cardiac Enzymes: Recent Labs  Lab 12/26/21 0550 12/27/21 0723 12/28/21 0727 12/29/21 0741  CKTOTAL 7,933* 3,166* 1,422* 691*   BNP (last 3 results) No results for input(s): PROBNP in the last 8760 hours. HbA1C: No results for input(s): HGBA1C in the last 72 hours. CBG: Recent Labs  Lab 01/01/22 0930 01/01/22 0946 01/01/22 0956  01/01/22 1008 01/01/22 1244  GLUCAP 47* 69*  98 109* 160*   Lipid Profile: No results for input(s): CHOL, HDL, LDLCALC, TRIG, CHOLHDL, LDLDIRECT in the last 72 hours. Thyroid Function Tests: No results for input(s): TSH, T4TOTAL, FREET4, T3FREE, THYROIDAB in the last 72 hours. Anemia Panel: Recent Labs    01/01/22 0610  TIBC 172*  IRON 42*   Sepsis Labs: Recent Labs  Lab 12/26/21 0550  PROCALCITON 0.78    Recent Results (from the past 240 hour(s))  Blood Culture (routine x 2)     Status: None   Collection Time: 12/24/21  6:28 PM   Specimen: BLOOD  Result Value Ref Range Status   Specimen Description BLOOD RIGHT FA  Final   Special Requests   Final    BOTTLES DRAWN AEROBIC AND ANAEROBIC Blood Culture adequate volume   Culture   Final    NO GROWTH 5 DAYS Performed at [facility], [address] [place], [place] [address]    Report Status 12/29/2021 FINAL  Final  Urine Culture     Status: Abnormal   Collection Time: 12/24/21  6:28 PM   Specimen: Urine, Random  Result Value Ref Range Status   Specimen Description   Final    URINE, RANDOM Performed at [facility], [address], [place], [place] [address]    Special Requests   Final    NONE Performed at [facility], [address] [place], [place] [address]    Culture (A)  Final    >=100,000 COLONIES/mL MULTIPLE SPECIES PRESENT, SUGGEST RECOLLECTION   Report Status 12/26/2021 FINAL  Final  Resp Panel by RT-PCR (Flu A&B, Covid) Nasopharyngeal Swab     Status: Abnormal   Collection Time: 12/24/21  6:29 PM   Specimen: Nasopharyngeal Swab; Nasopharyngeal(NP) swabs in vial transport medium  Result Value Ref Range Status   SARS Coronavirus 2 by RT PCR POSITIVE (A) NEGATIVE Final    Comment: (NOTE) SARS-CoV-2 target nucleic acids are DETECTED.  The SARS-CoV-2 RNA is generally detectable in upper respiratory specimens during the acute phase of infection. Positive results  are indicative of the presence of the identified virus, but do not rule out bacterial infection or co-infection with other pathogens not detected by the test. Clinical correlation with patient history and other diagnostic information is necessary to determine patient infection status. The expected result is Negative.  Fact Sheet for Patients: EntrepreneurPulse.com.au  Fact Sheet for Healthcare Providers: IncredibleEmployment.be  This test is not yet approved or cleared by the [place] FDA and  has been authorized for detection and/or diagnosis of SARS-CoV-2 by FDA under an Emergency Use Authorization (EUA).  This EUA will remain in effect (meaning this test can be used) for the duration of  the COVID-19 declaration under Section 564(b)(1) of the A ct, 21 U.S.C. section 360bbb-3(b)(1), unless the authorization is terminated or revoked sooner.     Influenza A by PCR NEGATIVE NEGATIVE Final   Influenza B by PCR NEGATIVE NEGATIVE Final    Comment: (NOTE) The Xpert Xpress SARS-CoV-2/FLU/RSV plus assay is intended as an aid in the diagnosis of influenza from Nasopharyngeal swab specimens and should not be used as a sole basis for treatment. Nasal washings and aspirates are unacceptable for Xpert Xpress SARS-CoV-2/FLU/RSV testing.  Fact Sheet for Patients: EntrepreneurPulse.com.au  Fact Sheet for Healthcare Providers: IncredibleEmployment.be  This test is not yet approved or cleared by the [place] FDA and has been authorized for detection and/or diagnosis of SARS-CoV-2 by FDA under an Emergency Use Authorization (EUA). This  EUA will remain in effect (meaning this test can be used) for the duration of the COVID-19 declaration under Section 564(b)(1) of the Act, 21 U.S.C. section 360bbb-3(b)(1), unless the authorization is terminated or revoked.  Performed at [facility], [address] [place], [place] [address]   Blood Culture (routine x 2)     Status: None   Collection Time: 12/24/21  6:30 PM   Specimen: BLOOD  Result Value Ref Range Status   Specimen Description BLOOD LEFT FA  Final   Special Requests   Final    BOTTLES DRAWN AEROBIC AND ANAEROBIC Blood Culture adequate volume   Culture   Final    NO GROWTH 5 DAYS Performed at [facility], [address] [place], [place] [address]    Report Status 12/29/2021 FINAL  Final  Urine Culture     Status: Abnormal   Collection Time: 12/24/21  9:15 PM   Specimen: Urine, Catheterized  Result Value Ref Range Status   Specimen Description   Final    URINE, CATHETERIZED Performed at [facility], [address], [place], [place] [address]    Special Requests   Final    NONE Performed at [facility], [address], [place], [place] [address]    Culture (A)  Final    >=100,000 COLONIES/mL MULTIPLE SPECIES PRESENT, SUGGEST RECOLLECTION   Report Status 12/26/2021 FINAL  Final         Radiology Studies: No results found.      Scheduled Meds:  sodium chloride   Intravenous Once   amLODipine  5 mg Oral Daily   vitamin C  500 mg Oral Daily   carvedilol  3.125 mg Oral BID   Chlorhexidine Gluconate Cloth  6 each Topical Daily   dextromethorphan-guaiFENesin  1 tablet Oral BID   folic acid  1 mg Oral Daily   insulin aspart  0-9 Units Subcutaneous TID WC   linagliptin  5 mg Oral Daily   multivitamin with minerals  1 tablet Oral Daily   pantoprazole  40 mg Oral BID   zinc sulfate  220 mg Oral Daily   Continuous Infusions:   LOS: 7 days       Shelly Coss, MD [facility] P1/23/2023, 1:05 PM

## 2022-01-01 NOTE — [place] Notes (Signed)
PT Cancellation Note  Patient Details Name: Jay Mathis MRN: [id number] DOB: 04-12-1924   Cancelled Treatment:    Reason Eval/Treat Not Completed: Medical issues which prohibited therapy: Pt's most recent hemoglobin 5.5 falling outside guidelines for participation with PT services.  Will attempt to see pt at a future date/time as medically appropriate.    Linus Salmons PT, DPT 01/01/22, 3:42 PM

## 2022-01-02 LAB — TYPE AND SCREEN
ABO/RH(D): A POS
Antibody Screen: NEGATIVE
Unit division: 0
Unit division: 0

## 2022-01-02 LAB — BASIC METABOLIC PANEL
Anion gap: 8 (ref 5–15)
BUN: 50 mg/dL — ABNORMAL HIGH (ref 8–23)
CO2: 24 mmol/L (ref 22–32)
Calcium: 7.8 mg/dL — ABNORMAL LOW (ref 8.9–10.3)
Chloride: 99 mmol/L (ref 98–111)
Creatinine, Ser: 1.98 mg/dL — ABNORMAL HIGH (ref 0.61–1.24)
GFR, Estimated: 31 mL/min — ABNORMAL LOW (ref >60)
Glucose, Bld: 180 mg/dL — ABNORMAL HIGH (ref 70–99)
Potassium: 4.3 mmol/L (ref 3.5–5.1)
Sodium: 131 mmol/L — ABNORMAL LOW (ref 135–145)

## 2022-01-02 LAB — CBC WITH DIFFERENTIAL/PLATELET
Abs Immature Granulocytes: 0.48 10*3/uL — ABNORMAL HIGH (ref 0.00–0.07)
Basophils Absolute: 0 10*3/uL (ref 0.0–0.1)
Basophils Relative: 0 %
Eosinophils Absolute: 0 10*3/uL (ref 0.0–0.5)
Eosinophils Relative: 0 %
HCT: 22.1 % — ABNORMAL LOW (ref 39.0–52.0)
Hemoglobin: 7.8 g/dL — ABNORMAL LOW (ref 13.0–17.0)
Immature Granulocytes: 4 %
Lymphocytes Relative: 9 %
Lymphs Abs: 1 10*3/uL (ref 0.7–4.0)
MCH: 26.9 pg (ref 26.0–34.0)
MCHC: 35.3 g/dL (ref 30.0–36.0)
MCV: 76.2 fL — ABNORMAL LOW (ref 80.0–100.0)
Monocytes Absolute: 1.3 10*3/uL — ABNORMAL HIGH (ref 0.1–1.0)
Monocytes Relative: 12 %
Neutro Abs: 8 10*3/uL — ABNORMAL HIGH (ref 1.7–7.7)
Neutrophils Relative %: 75 %
Platelets: 126 10*3/uL — ABNORMAL LOW (ref 150–400)
RBC: 2.9 MIL/uL — ABNORMAL LOW (ref 4.22–5.81)
RDW: 15.1 % (ref 11.5–15.5)
WBC: 10.8 10*3/uL — ABNORMAL HIGH (ref 4.0–10.5)
nRBC: 0.8 % — ABNORMAL HIGH (ref 0.0–0.2)

## 2022-01-02 LAB — GLUCOSE, CAPILLARY
Glucose-Capillary: 184 mg/dL — ABNORMAL HIGH (ref 70–99)
Glucose-Capillary: 242 mg/dL — ABNORMAL HIGH (ref 70–99)
Glucose-Capillary: 226 mg/dL — ABNORMAL HIGH (ref 70–99)
Glucose-Capillary: 298 mg/dL — ABNORMAL HIGH (ref 70–99)

## 2022-01-02 LAB — BPAM RBC
Blood Product Expiration Date: 202302062359
Blood Product Expiration Date: 202302112359
ISSUE DATE / TIME: 202301231208
ISSUE DATE / TIME: 202301231605
Unit Type and Rh: 6200
Unit Type and Rh: 6200

## 2022-01-02 MED ORDER — GERHARDT'S BUTT CREAM
TOPICAL_CREAM | Freq: Three times a day (TID) | CUTANEOUS | Status: DC | PRN
  Filled 2022-01-02: qty 1

## 2022-01-02 NOTE — Progress Notes (Addendum)
PROGRESS NOTE    ZEPHAN BEAUCHAINE  JJO:[id number] DOB: 01-05-1924 DOA: 12/24/2021 PCP: [name], MD   Chief Complaint  Patient presents with   Weakness  Brief Narrative/Hospital Course: [name], 88 y.o. male with PMH of CAD, urinary retention with indwelling Foley catheter since August 2020 (follows with urologist Dr. Diamantina Providence), CAD s/p CABG, IDA, NIDDM, and CKD IV baseline Cr 1.8-2.8 who presented with altered mental status, weakness, and fall.In the ER, patient was altered, with tachycardia, tachypnea,had  lactic acidosis and confusion.  COVID+.  He required high flow nasal cannula for about 24 hours, subsequently weaned.  He was also found to have aspiration pneumonia and was started on antibiotics.  PT/OT recommending skilled nursing facility on discharge.  Hospital course remarkable for dropping of hemoglobin to the range of 5, unknown etiology, being given 2 units of blood transfusion. Patient underwent CT scan of the abdomen pelvis that showed associated hematoma likely the cause of her bleeding   Subjective: Seen examined this morning mild abdominal soreness, hemoglobin appropriately increased.  No new complaints.  On nasal cannula.   Assessment & Plan:  Acute respiratory failure with hypoxia  COVID-19 infection Aspiration pneumonia: Patient completed remdesivir and steroid for COVID-19 infection.  Remains on supplemental oxygen continue to maintain saturation at least 93%.  Patient completed antibiotic for aspiration pneumonia, seen the patient continue dysphagia 2 diet with aspiration precaution.  Afebrile WBC count is stable.  Continue COVID-19 isolation Recent Labs  Lab 12/29/21 0741 12/30/21 0624 01/01/22 0610 01/01/22 0924 01/02/22 0625  WBC 13.9* 12.7* 11.5*   11.5* 14.9* 10.8*   Acute blood loss anemia/anemia of chronic renal disease iron deficiency anemia Left rectus sheath hematoma  17.6 x 7.5 x 6.2 Smaller left psoas muscle and right iliac [place] muscle  hematoma: Hemoglobin is increased with transfusion.  According Lovenox/heparin monitor CBC continue supportive care Recent Labs  Lab 12/30/21 0624 01/01/22 0610 01/01/22 0924 01/01/22 2048 01/02/22 0625  HGB 7.6* 4.9*   4.8* 5.5* 8.0* 7.8*  HCT 22.2* 14.2*   13.9* 16.0* 22.6* 16.0*   Acute metabolic encephalopathy multifactorial Baseline lives independently, no cognitive impairment.  He has been confused he frail.  In the setting of COVID-19 infection sepsis.  Continue delirium precaution fall precaution, PT OT.  Small bilateral pleural effusion/pulmonary atelectasis: Encourage incentive spirometry, PT OT  Sepsis POA Pyuria: Patient completed antibiotic, UTI ruled out sepsis likely from aspiration pneumonia/COVID-19 infection.  Type 2 diabetes mellitus with stage 4 chronic kidney disease and hyperglycemia: Blood sugar stable continue sliding scale Recent Labs  Lab 01/01/22 1008 01/01/22 1244 01/01/22 1727 01/01/22 2140 01/02/22 0810  GLUCAP 109* 160* 197* 209* 184*    CAD: No chest pain.  Continue Coreg.  Statin held due to rhabdomyolysis Essential hypertension: Stable on Coreg amlodipine Chronic diastolic CHF euvolemic EF 60 to 65% grade 2 DD continue home Coreg holding Lasix  BPH Chronic indwelling Foley catheter in place  followed by urology exchanged monthly last replaced 1/15  Rib fracture incidental on admission a CT. supportive care  Rhabdomyolysis POA due to trauma with fall possibly COVID-19 infection.  Resolved after fluids  CKD stage 4: Renal function overall stable and improving.  Monitor Recent Labs  Lab 12/28/21 0727 12/29/21 0741 12/30/21 0624 01/01/22 0610 01/02/22 0625  BUN 73* 67* 66* 61* 50*  CREATININE 2.48* 2.28* 2.13* 2.11* 1.98*    GERD continue PPI  Deconditioning/debility/goals of care: Seen by palliative care DNR overall prognosis remains guarded.  Continue PT  OT with plan for skilled nursing facility+ As per palliative  care-"Disposition options discussed including home with hospice, hospice inpatient unit, and going to rehab with extra layer of palliative support.  [name] shares he is not prepared to make a decision regarding disposition at this time.  He appreciates all options and understands these are recommendations that he and his brother need to discuss".   DVT prophylaxis: Place TED hose Start: 12/24/21 2220 Code Status:   Code Status: DNR Family Communication: plan of care discussed with patient at bedside. Status is: Inpatient  Remains inpatient appropriate because: Ongoing management of deconditioning and multiple comorbidities    Disposition: Currently NOT medically stable for discharge. Anticipated Disposition: snf  Total time spent in the care of this patient 35 MINUTES Objective: Vitals last 24 hrs: Vitals:   01/01/22 2140 01/02/22 0019 01/02/22 0420 01/02/22 0808  BP: (!) 121/39 (!) 142/63 (!) 142/65 (!) 137/51  Pulse: 67 70 82 82  Resp: 15 16    Temp: 98.4 F (36.9 C) 98.2 F (36.8 C) 98.4 F (36.9 C) 97.8 F (36.6 C)  TempSrc:  Oral Oral   SpO2: 98% 96% 99% 99%  Weight:      Height:       Weight change:   Intake/Output Summary (Last 24 hours) at 01/02/2022 1220 Last data filed at 01/02/2022 0847 Gross per 24 hour  Intake 660 ml  Output 1600 ml  Net -940 ml   Net IO Since Admission: -6,221.83 mL [01/02/22 1220]   Physical Examination: General exam: AA0x2, weak,older than stated age. HEENT:Oral mucosa moist, Ear/Nose WNL grossly,dentition normal. Respiratory system: B/l diminished BS, no use of accessory muscle, non tender. Cardiovascular system: S1 & S2 +,No JVD. Gastrointestinal system: Abdomen soft, NT,ND, BS+. Nervous System:Alert, awake, moving extremities. Extremities: edema none, distal peripheral pulses palpable.  Skin: No rashes, no icterus. MSK: Normal muscle bulk, tone, power. Mid abdomen area with a small amount of bruits, tender abdomen  Medications  reviewed:  Scheduled Meds:  amLODipine  5 mg Oral Daily   vitamin C  500 mg Oral Daily   carvedilol  3.125 mg Oral BID   Chlorhexidine Gluconate Cloth  6 each Topical Daily   dextromethorphan-guaiFENesin  1 tablet Oral BID   folic acid  1 mg Oral Daily   insulin aspart  0-9 Units Subcutaneous TID WC   multivitamin with minerals  1 tablet Oral Daily   pantoprazole  40 mg Oral BID   polyethylene glycol  17 g Oral Daily   senna  1 tablet Oral BID   zinc sulfate  220 mg Oral Daily   Continuous Infusions:  Diet Order             DIET DYS 2 Room service appropriate? No; Fluid consistency: Thin  Diet effective now                          Weight change:   Wt Readings from Last 3 Encounters:  12/26/21 63 kg  07/31/21 64.5 kg  09/23/20 63.5 kg     Consultants:see note  Procedures:see note Antimicrobials: Anti-infectives (From admission, onward)    Start     Dose/Rate Route Frequency Ordered Stop   12/27/21 1000  Ampicillin-Sulbactam (UNASYN) 3 g in sodium chloride 0.9 % 100 mL IVPB        3 g 200 mL/hr over 30 Minutes Intravenous Every 12 hours 12/26/21 1410 12/31/21 2245   12/26/21 1000  remdesivir  100 mg in sodium chloride 0.9 % 100 mL IVPB       See Hyperspace for full Linked Orders Report.   100 mg 200 mL/hr over 30 Minutes Intravenous Daily 12/25/21 1706 12/29/21 0944   12/25/21 1830  remdesivir 200 mg in sodium chloride 0.9% 250 mL IVPB       See Hyperspace for full Linked Orders Report.   200 mg 580 mL/hr over 30 Minutes Intravenous  Once 12/25/21 1706 12/25/21 2122   12/25/21 1000  cefTRIAXone (ROCEPHIN) 2 g in sodium chloride 0.9 % 100 mL IVPB        2 g 200 mL/hr over 30 Minutes Intravenous Every 24 hours 12/24/21 2238 12/26/21 1259   12/24/21 2200  cefTRIAXone (ROCEPHIN) 1 g in sodium chloride 0.9 % 100 mL IVPB        1 g 200 mL/hr over 30 Minutes Intravenous  Once 12/24/21 2159 12/24/21 2321      Culture/Microbiology    Component Value Date/Time    SDES  12/24/2021 2115    URINE, CATHETERIZED Performed at [facility], [address] [place], [place] [address]    [facility]  12/24/2021 2115    NONE Performed at [facility], [address] [place], [place] [address]    CULT (A) 12/24/2021 2115    >=100,000 COLONIES/mL MULTIPLE SPECIES PRESENT, SUGGEST RECOLLECTION   REPTSTATUS 12/26/2021 FINAL 12/24/2021 2115    Other culture-see note  Unresulted Labs (From admission, onward)    None     Data Reviewed: I have personally reviewed following labs and imaging studies CBC: Recent Labs  Lab 12/28/21 0727 12/29/21 0741 12/30/21 0624 01/01/22 0610 01/01/22 0924 01/01/22 2048 01/02/22 0625  WBC 17.1* 13.9* 12.7* 11.5*   11.5* 14.9*  --  10.8*  NEUTROABS 15.4* 12.4*  --  8.0* 10.4*  --  8.0*  HGB 9.6* 8.9* 7.6* 4.9*   4.8* 5.5* 8.0* 7.8*  HCT 28.0* 26.0* 22.2* 14.2*   13.9* 16.0* 22.6* 22.1*  MCV 79.8* 79.0* 78.4* 78.9*   76.8* 78.4*  --  76.2*  PLT 151 156 159 126*   132* 152  --  631*   Basic Metabolic Panel: Recent Labs  Lab 12/27/21 0723 12/28/21 0727 12/29/21 0741 12/30/21 0624 01/01/22 0610 01/02/22 0625  NA 127* 130* 131* 131* 131* 131*  K 4.9 5.3* 4.1 4.6 3.9 4.3  CL 91* 98 99 96* 100 99  CO2 25 25 23 24 25 24   GLUCOSE 225* 198* 213* 166* 54* 180*  BUN 60* 73* 67* 66* 61* 50*  CREATININE 2.40* 2.48* 2.28* 2.13* 2.11* 1.98*  CALCIUM 8.2* 8.0* 7.7* 8.0* 7.8* 7.8*  MG 1.7  --   --   --   --   --   PHOS 5.7*  --   --   --   --   --    GFR: Estimated Creatinine Clearance: 19.9 mL/min (A) (by C-G formula based on SCr of 1.98 mg/dL (H)). Liver Function Tests: Recent Labs  Lab 12/27/21 0723 12/28/21 0727 12/29/21 0741 12/30/21 0624  AST 109* 76* 56* 46*  ALT 41 34 32 30  ALKPHOS 69 63 59 54  BILITOT 0.5 0.6 0.5 0.5  PROT 5.8* 5.6* 5.6* 5.5*  ALBUMIN 2.7* 2.5* 2.6* 2.7*   No results for input(s): LIPASE, AMYLASE in the last 168 hours. No results for input(s): AMMONIA in  the last 168 hours. Coagulation Profile: No results for input(s): INR, PROTIME in the last 168 hours. Cardiac  Enzymes: Recent Labs  Lab 12/27/21 0723 12/28/21 0727 12/29/21 0741  CKTOTAL 3,166* 1,422* 691*   BNP (last 3 results) No results for input(s): PROBNP in the last 8760 hours. HbA1C: No results for input(s): HGBA1C in the last 72 hours. CBG: Recent Labs  Lab 01/01/22 1008 01/01/22 1244 01/01/22 1727 01/01/22 2140 01/02/22 0810  GLUCAP 109* 160* 197* 209* 184*   Lipid Profile: No results for input(s): CHOL, HDL, LDLCALC, TRIG, CHOLHDL, LDLDIRECT in the last 72 hours. Thyroid Function Tests: No results for input(s): TSH, T4TOTAL, FREET4, T3FREE, THYROIDAB in the last 72 hours. Anemia Panel: Recent Labs    01/01/22 0610  TIBC 172*  IRON 42*   Sepsis Labs: No results for input(s): PROCALCITON, LATICACIDVEN in the last 168 hours.  Recent Results (from the past 240 hour(s))  Blood Culture (routine x 2)     Status: None   Collection Time: 12/24/21  6:28 PM   Specimen: BLOOD  Result Value Ref Range Status   Specimen Description BLOOD RIGHT FA  Final   Special Requests   Final    BOTTLES DRAWN AEROBIC AND ANAEROBIC Blood Culture adequate volume   Culture   Final    NO GROWTH 5 DAYS Performed at [facility], [address], [place], [place] [address]    Report Status 12/29/2021 FINAL  Final  Urine Culture     Status: Abnormal   Collection Time: 12/24/21  6:28 PM   Specimen: Urine, Random  Result Value Ref Range Status   Specimen Description   Final    URINE, RANDOM Performed at [facility], [address] [place], [place] [address]    Special Requests   Final    NONE Performed at [facility], [address] [place], [place] [address]    Culture (A)  Final    >=100,000 COLONIES/mL MULTIPLE SPECIES PRESENT, SUGGEST RECOLLECTION   Report Status 12/26/2021 FINAL  Final  Resp Panel by RT-PCR (Flu A&B, Covid) Nasopharyngeal  Swab     Status: Abnormal   Collection Time: 12/24/21  6:29 PM   Specimen: Nasopharyngeal Swab; Nasopharyngeal(NP) swabs in vial transport medium  Result Value Ref Range Status   SARS Coronavirus 2 by RT PCR POSITIVE (A) NEGATIVE Final    Comment: (NOTE) SARS-CoV-2 target nucleic acids are DETECTED.  The SARS-CoV-2 RNA is generally detectable in upper respiratory specimens during the acute phase of infection. Positive results are indicative of the presence of the identified virus, but do not rule out bacterial infection or co-infection with other pathogens not detected by the test. Clinical correlation with patient history and other diagnostic information is necessary to determine patient infection status. The expected result is Negative.  Fact Sheet for Patients: EntrepreneurPulse.com.au  Fact Sheet for Healthcare Providers: IncredibleEmployment.be  This test is not yet approved or cleared by the [place] FDA and  has been authorized for detection and/or diagnosis of SARS-CoV-2 by FDA under an Emergency Use Authorization (EUA).  This EUA will remain in effect (meaning this test can be used) for the duration of  the COVID-19 declaration under Section 564(b)(1) of the A ct, 21 U.S.C. section 360bbb-3(b)(1), unless the authorization is terminated or revoked sooner.     Influenza A by PCR NEGATIVE NEGATIVE Final   Influenza B by PCR NEGATIVE NEGATIVE Final    Comment: (NOTE) The Xpert Xpress SARS-CoV-2/FLU/RSV plus assay is intended as an aid in the diagnosis of influenza from Nasopharyngeal swab specimens and should not be used as  a sole basis for treatment. Nasal washings and aspirates are unacceptable for Xpert Xpress SARS-CoV-2/FLU/RSV testing.  Fact Sheet for Patients: EntrepreneurPulse.com.au  Fact Sheet for Healthcare Providers: IncredibleEmployment.be  This test is not yet approved or cleared  by the [place] FDA and has been authorized for detection and/or diagnosis of SARS-CoV-2 by FDA under an Emergency Use Authorization (EUA). This EUA will remain in effect (meaning this test can be used) for the duration of the COVID-19 declaration under Section 564(b)(1) of the Act, 21 U.S.C. section 360bbb-3(b)(1), unless the authorization is terminated or revoked.  Performed at [facility], [address] [place], [place] [address]   Blood Culture (routine x 2)     Status: None   Collection Time: 12/24/21  6:30 PM   Specimen: BLOOD  Result Value Ref Range Status   Specimen Description BLOOD LEFT FA  Final   Special Requests   Final    BOTTLES DRAWN AEROBIC AND ANAEROBIC Blood Culture adequate volume   Culture   Final    NO GROWTH 5 DAYS Performed at [facility], [address] [place], [place] [address]    Report Status 12/29/2021 FINAL  Final  Urine Culture     Status: Abnormal   Collection Time: 12/24/21  9:15 PM   Specimen: Urine, Catheterized  Result Value Ref Range Status   Specimen Description   Final    URINE, CATHETERIZED Performed at [facility], [address], [place], [place] [address]    Special Requests   Final    NONE Performed at [facility], [address] [place], [place] [address]    Culture (A)  Final    >=100,000 COLONIES/mL MULTIPLE SPECIES PRESENT, SUGGEST RECOLLECTION   Report Status 12/26/2021 FINAL  Final     Radiology Studies: CT ABDOMEN PELVIS WO CONTRAST  Result Date: 01/01/2022 CLINICAL DATA:  Acute, non localized abdominal pain. EXAM: CT ABDOMEN AND PELVIS WITHOUT CONTRAST TECHNIQUE: Multidetector CT imaging of the abdomen and pelvis was performed following the standard protocol without IV contrast. RADIATION DOSE REDUCTION: This exam was performed according to the departmental dose-optimization program which includes automated exposure control, adjustment of the mA and/or kV according to  patient size and/or use of iterative reconstruction technique. COMPARISON:  12/24/2021 FINDINGS: Lower chest: Interval small bilateral pleural effusions and bilateral dependent atelectasis, right greater than left. Hepatobiliary: No focal liver abnormality is seen. No gallstones, gallbladder wall thickening, or biliary dilatation. Pancreas: Moderate diffuse pancreatic atrophy. Spleen: Normal in size without focal abnormality. Adrenals/Urinary Tract: Normal appearing adrenal glands. Stable bilateral renal atrophy with small bilateral cysts, including a proteinaceous or hemorrhagic cyst on the left. Multiple small right renal calculi are again demonstrated. Foley catheter in the urinary bladder with a small amount of urine in the bladder. Small amount of dependent calcification in the posterior urinary bladder on the right with improvement. No ureteral calculi or hydronephrosis seen. Moderate diffuse bladder wall thickening, accentuated by the interval decrease in bladder volume. Stomach/Bowel: Small number of sigmoid colon diverticula without evidence of diverticulitis. Unremarkable stomach and small bowel. The appendix is not identified and there are no findings suspicious for appendicitis. Vascular/Lymphatic: Atheromatous arterial calcifications without aneurysm. No enlarged lymph nodes. Reproductive: Mild to moderate prostatic hypertrophy. Other: Interval left rectus sheath hematoma measuring 7.5 x 6.2 cm on image number 73/3 and 17.6 cm in length on sagittal image number 79/7. Smaller hematomas in the left psoas muscle and right iliacus muscle. Large right inguinal hernia containing  multiple small bowel loops without obstruction. Small left inguinal hernia containing fat. Musculoskeletal: Lumbar and lower thoracic spine degenerative changes. IMPRESSION: 1. Interval 17.6 x 7.5 x 6.2 cm left rectus sheath hematoma. 2. Interval smaller left psoas muscle and right iliacus muscle hematomas. 3. Interval small bilateral  pleural effusions and mild bilateral lower lobe atelectasis. 4. Partial clearing of previously demonstrated small bladder calculi. 5. Stable multiple small, nonobstructing right renal calculi. 6. Stable large right inguinal hernia containing multiple small bowel loops without obstruction. 7. Mild sigmoid colon diverticulosis. 8. Stable bilateral renal atrophy and pancreatic atrophy. These results will be called to the ordering clinician or representative by the Radiologist Assistant, and communication documented in the PACS or [employer]. Electronically Signed   By: Claudie Revering M.D.   On: 01/01/2022 16:15     LOS: 8 days   Antonieta Pert, MD [facility]  01/02/2022, 12:20 PM

## 2022-01-02 NOTE — Progress Notes (Signed)
Physical Therapy Treatment Patient Details Name: Jay Mathis MRN: [id number] DOB: 08-03-1924 Today's Date: 01/02/2022   History of Present Illness 88 year old male with history of CAD, urinary retention, with indwelling Foley catheter since August 2020 (follows with urologist Dr. Diamantina Providence), CAD with history of CABG more than 10 years ago, iron deficiency anemia, non-insulin-dependent diabetes mellitus type 2, hyperlipidemia CKD stage IV, who presented emergency department from home via EMS  on evening of 12/24/2021 with altered mental status, weakness, and fall.     Evaluation in the ED revealed a UTI, pt met criteria for sepsis with tachypnea, tachycardia and elevated lactic acid.  Pt also tested positive for Covid-19    PT Comments    Pt was pleasant and motivated to participate during the session and put forth good effort throughout. Pt required physical assistance with bed mobility tasks but presented with improved static sitting balance unsupported at the EOB.  Pt performed multiple partial sit to stands but full transfers and gait deferred secondary to pt having a large watery BM at EOB.  Pt will benefit from PT services in a SNF setting upon discharge to safely address deficits listed in patient problem list for decreased caregiver assistance and eventual return to PLOF.    Recommendations for follow up therapy are one component of a multi-disciplinary discharge planning process, led by the attending physician.  Recommendations may be updated based on patient status, additional functional criteria and insurance authorization.  Follow Up Recommendations  Skilled nursing-short term rehab (<3 hours/day)     Assistance Recommended at Discharge Frequent or constant Supervision/Assistance  Patient can return home with the following A little help with walking and/or transfers;A little help with bathing/dressing/bathroom;Direct supervision/assist for medications management;Direct  supervision/assist for financial management;Help with stairs or ramp for entrance;Assistance with cooking/housework   Equipment Recommendations  Rolling walker (2 wheels)    Recommendations for Other Services       Precautions / Restrictions Precautions Precautions: Fall Precaution Comments: airborne precautions Restrictions Weight Bearing Restrictions: No     Mobility  Bed Mobility Overal bed mobility: Needs Assistance Bed Mobility: Supine to Sit, Rolling, Sit to Sidelying Rolling: Min assist Sidelying to sit: Min assist, Mod assist     Sit to sidelying: Min assist, Mod assist General bed mobility comments: Min to mod A for BLE and trunk control    Transfers Overall transfer level: Needs assistance Equipment used: Rolling walker (2 wheels) Transfers: Sit to/from Stand Sit to Stand: Min assist, From elevated surface           General transfer comment: Multiple partial sit to/from stands with min A    Ambulation/Gait               General Gait Details: Deferred secondary to pt having large watery BM, nursing notified and assisted with hygiene   Stairs             Wheelchair Mobility    Modified Rankin (Stroke Patients Only)       Balance Overall balance assessment: Needs assistance Sitting-balance support: Feet supported, Single extremity supported Sitting balance-Leahy Scale: Fair                                      Cognition Arousal/Alertness: Awake/alert Behavior During Therapy: WFL for tasks assessed/performed Overall Cognitive Status: No family/caregiver present to determine baseline cognitive functioning  Exercises Other Exercises °Other Exercises: Static sitting at EOB for improved core strength and activity tolerance °Other Exercises: Rolling left/right in bed for core and UE strengthening and improved activity tolerance ° °  °General Comments   °  °   ° °Pertinent Vitals/Pain Pain Assessment °Pain Assessment: No/denies pain  ° ° °Home Living   °  °  °  °  °  °  °  °  °  °   °  °Prior Function    °  °  °   ° °PT Goals (current goals can now be found in the care plan section) Progress towards PT goals: PT to reassess next treatment ° °  °Frequency ° ° ° Min 2X/week ° ° ° °  °PT Plan Current plan remains appropriate  ° ° °Co-evaluation   °  °  °  °  ° °  °AM-PAC PT "6 Clicks" Mobility   °Outcome Measure ° Help needed turning from your back to your side while in a flat bed without using bedrails?: A Little °Help needed moving from lying on your back to sitting on the side of a flat bed without using bedrails?: A Lot °Help needed moving to and from a bed to a chair (including a wheelchair)?: A Lot °Help needed standing up from a chair using your arms (e.g., wheelchair or bedside chair)?: A Lot °Help needed to walk in hospital room?: A Lot °Help needed climbing 3-5 steps with a railing? : Total °6 Click Score: 12 ° °  °End of Session Equipment Utilized During Treatment: Oxygen °Activity Tolerance: Patient tolerated treatment well °Patient left: in bed;with call bell/phone within reach;with bed alarm set;with nursing/sitter in room °Nurse Communication: Mobility status °PT Visit Diagnosis: Muscle weakness (generalized) (M62.81);Difficulty in walking, not elsewhere classified (R26.2);Unsteadiness on feet (R26.81) °  ° ° °Time: 1050-1128 °PT Time Calculation (min) (ACUTE ONLY): 38 min ° °Charges:  $Therapeutic Activity: 38-52 mins          °          ° °D. Scott Tanner PT, DPT °01/02/22, 2:08 PM ° ° °

## 2022-01-03 LAB — CBC
HCT: 23.6 % — ABNORMAL LOW (ref 39.0–52.0)
Hemoglobin: 8.1 g/dL — ABNORMAL LOW (ref 13.0–17.0)
MCH: 27.2 pg (ref 26.0–34.0)
MCHC: 34.3 g/dL (ref 30.0–36.0)
MCV: 79.2 fL — ABNORMAL LOW (ref 80.0–100.0)
Platelets: 149 10*3/uL — ABNORMAL LOW (ref 150–400)
RBC: 2.98 MIL/uL — ABNORMAL LOW (ref 4.22–5.81)
RDW: 15 % (ref 11.5–15.5)
WBC: 14.7 10*3/uL — ABNORMAL HIGH (ref 4.0–10.5)
nRBC: 0.1 % (ref 0.0–0.2)

## 2022-01-03 LAB — BASIC METABOLIC PANEL
Anion gap: 8 (ref 5–15)
BUN: 37 mg/dL — ABNORMAL HIGH (ref 8–23)
CO2: 24 mmol/L (ref 22–32)
Calcium: 8.1 mg/dL — ABNORMAL LOW (ref 8.9–10.3)
Chloride: 98 mmol/L (ref 98–111)
Creatinine, Ser: 1.89 mg/dL — ABNORMAL HIGH (ref 0.61–1.24)
GFR, Estimated: 32 mL/min — ABNORMAL LOW (ref >60)
Glucose, Bld: 207 mg/dL — ABNORMAL HIGH (ref 70–99)
Potassium: 4.7 mmol/L (ref 3.5–5.1)
Sodium: 130 mmol/L — ABNORMAL LOW (ref 135–145)

## 2022-01-03 LAB — GLUCOSE, CAPILLARY
Glucose-Capillary: 201 mg/dL — ABNORMAL HIGH (ref 70–99)
Glucose-Capillary: 228 mg/dL — ABNORMAL HIGH (ref 70–99)
Glucose-Capillary: 168 mg/dL — ABNORMAL HIGH (ref 70–99)
Glucose-Capillary: 207 mg/dL — ABNORMAL HIGH (ref 70–99)

## 2022-01-03 MED ORDER — FUROSEMIDE 40 MG PO TABS
40.0000 mg | ORAL_TABLET | Freq: Every day | ORAL | Status: DC
  Administered 2022-01-03 – 2022-01-04 (×2): 40 mg via ORAL
  Filled 2022-01-03 (×2): qty 1

## 2022-01-03 NOTE — Progress Notes (Signed)
Inpatient Diabetes Program Recommendations  AACE/ADA: New Consensus Statement on Inpatient Glycemic Control   Target Ranges:  Prepandial:   less than 140 mg/dL      Peak postprandial:   less than 180 mg/dL (1-2 hours)      Critically ill patients:  140 - 180 mg/dL    Latest Reference Range & Units 01/02/22 08:10 01/02/22 12:57 01/02/22 17:58 01/02/22 21:04 01/03/22 09:00  Glucose-Capillary 70 - 99 mg/dL 184 (H) 242 (H) 226 (H) 298 (H) 201 (H)  (H): Data is abnormally high  Review of Glycemic Control Diabetes history: DM2 Outpatient Diabetes medications: Amaryl 2 mg daily Current orders for Inpatient glycemic control: Novolog 0-9 units TID   Inpatient Diabetes Program Recommendations:    DM medications: Patient was ordered Levemir and Tradjenta and both were discontinued on 01/01/22 due to hypoglycemia. May want to consider reordering Tradjenta 5 mg daily. If provider prefers to use insulin instead of adding oral DM med, may want to consider ordering Novolog 3 units TID with meals for meal coverage if patient eats at least 50% of meals.  Thanks, Barnie Alderman, RN, MSN, CDE Diabetes Coordinator Inpatient Diabetes Program [phone] (Team Pager from 8am to 5pm)

## 2022-01-03 NOTE — Progress Notes (Signed)
PROGRESS NOTE    Jay Mathis  OVF:[id number] DOB: 04-30-1924 DOA: 12/24/2021 PCP: Jay [place], MD   Chief Complaint  Patient presents with   Weakness  Brief Narrative/Hospital Course: Jay Mathis, 88 y.o. male with PMH of CAD, urinary retention with indwelling Foley catheter since August 2020 (follows with urologist Dr. Diamantina Mathis), CAD s/p CABG, IDA, NIDDM, and CKD IV baseline Cr 1.8-2.8 who presented with altered mental status, weakness, and fall.In the ER, patient was altered, with tachycardia, tachypnea,had  lactic acidosis and confusion.  COVID+.  He required high flow nasal cannula for about 24 hours, subsequently weaned.  He was also found to have aspiration pneumonia and was started on antibiotics.  PT/OT recommending skilled nursing facility on discharge.  Hospital course remarkable for dropping of hemoglobin to the range of 5, unknown etiology, being given 2 units of blood transfusion.Patient underwent CT scan of the abdomen pelvis 1/23 that showed associated hematoma likely the cause of anemia.   Subjective: Seen and examined this morning Overnight Not febrile butr he has been needing high flow nasal cannula up to 6 L, rest of the vital stable. Patient reports she is not short of breath he is coughing at times.  Appears alert awake able to follow commands  Assessment & Plan:  Acute respiratory failure with hypoxia  COVID-19 infection Aspiration pneumonia: Patient completed remdesivir and steroid for COVID-19 infection.  Remains on supplemental oxygen -but needing high flow nasal cannula overnight.  Continue supplemental oxygen, incentive spirometry encourage PT OT. Patient completed antibiotic for aspiration pneumonia,  on dysphagia 2 diet with aspiration precaution per SLP. Continue COVID-19 isolation.  Needing 6 L HFNC overnight I will resume his home Lasix given some edema in his leg and tenuous respiratory status.Monitor respiratory status closely.  Slight bump in WBC  count, monitor Recent Labs  Lab 12/30/21 0624 01/01/22 0610 01/01/22 0924 01/02/22 0625 01/03/22 0827  WBC 12.7* 11.5*   11.5* 14.9* 10.8* 14.7*   Acute blood loss anemia/anemia of chronic renal disease iron deficiency anemia Left rectus sheath hematoma  17.6 x 7.5 x 6.2 Smaller left psoas muscle and right iliac [place] muscle hematoma: Acute blood loss anemia due to rectus sheath hematoma as above.Hemoglobin is increased with transfusion. held heparin 1/23.  F/u labs-says hemoglobin is stable.  Has some bruise/ecchymosis on the periumbilical area Recent Labs  Lab 01/01/22 0610 01/01/22 0924 01/01/22 2048 01/02/22 0625 01/03/22 0827  HGB 4.9*   4.8* 5.5* 8.0* 7.8* 8.1*  HCT 14.2*   13.9* 16.0* 22.6* 22.1* 84.1*   Acute metabolic encephalopathy multifactorial Baseline lives independently, no cognitive impairment.  He has been confused , he appears frail.  More alert awake this morning.  Multifactorial in the setting of COVID-19 infection sepsis.  Continue delirium precaution fall precaution, PT OT.  Small bilateral pleural effusion/pulmonary atelectasis: Encourage incentive spirometry, PT OT  Sepsis POA Pyuria: Patient completed antibiotic, UTI ruled out sepsis likely from aspiration pneumonia/COVID-19 infection.  Type 2 diabetes mellitus with stage 4 chronic kidney disease and hyperglycemia: Blood sugar uncontrolled with hyperglycemiae , continue continue sliding scale.  Last HbA1c 9.71/16  Recent Labs  Lab 01/02/22 0810 01/02/22 1257 01/02/22 1758 01/02/22 2104 01/03/22 0900  GLUCAP 184* 242* 226* 298* 201*    CAD: No chest pain.  Continue Coreg.  Statin held due to rhabdomyolysis Essential hypertension: BP stable on Coreg amlodipine Chronic diastolic CHF euvolemic EF 60 to 65% grade 2 DD continue home Coreg, holding . Net IO Since Admission: -9,121.83 mL [  01/03/22 1046] . Wt as below: [employer]   12/24/21 1826 12/26/21 0359  Weight: 83.9 kg 63 kg    BPH Chronic  indwelling Foley catheter in place  followed by urology exchanged monthly last replaced 1/15  Rib fracture incidental on admission a CT. supportive care  Rhabdomyolysis POA due to trauma with fall possibly COVID-19 infection.  Resolved after fluids  CKD stage 4: Renal function overall stable, at 1.8. resume home lasix. Cont to monitor Recent Labs  Lab 12/29/21 0741 12/30/21 0624 01/01/22 0610 01/02/22 0625 01/03/22 0827  BUN 67* 66* 61* 50* 37*  CREATININE 2.28* 2.13* 2.11* 1.98* 1.89*    GERD continue PPI  Deconditioning/debility/goals of care: Seen by palliative care and is DNR overall prognosis remains guarded. We will continue PT OT and full supportive care.  Plan is for skilled nursing facility+ As per palliative care-"Disposition options discussed including home with hospice, hospice inpatient unit, and going to rehab with extra layer of palliative support.  Jay Mathis shares he is not prepared to make a decision regarding disposition at this time.  He appreciates all options and understands these are recommendations that he and his brother need to discuss".  If respiratory status remains tenuous/worsens will need to revisit his disposition plan  DVT prophylaxis: Place TED hose Start: 12/24/21 2220.  No chemical prophylaxis due to his rectus sheath hematoma Code Status:   Code Status: DNR Family Communication: plan of care discussed with patient at bedside. Status is: Inpatient Remains inpatient appropriate because: Ongoing management of deconditioning and multiple comorbidities Disposition: Currently NOT medically stable for discharge. Anticipated Disposition: snf  Total time spent in the care of this patient 35 MINUTES Objective: Vitals last 24 hrs: Vitals:   01/02/22 2049 01/02/22 2353 01/03/22 0421 01/03/22 0859  BP:  (!) 131/53 139/61 (!) 155/63  Pulse:  60 63 86  Resp:  18  20  Temp:  98.3 F (36.8 C) 98.2 F (36.8 C) (!) 97.5 F (36.4 C)  TempSrc:  Oral Oral    SpO2: 99% 100% 100% 100%  Weight:      Height:       Weight change:   Intake/Output Summary (Last 24 hours) at 01/03/2022 1046 Last data filed at 01/03/2022 0900 Gross per 24 hour  Intake --  Output 2900 ml  Net -2900 ml   Net IO Since Admission: -9,121.83 mL [01/03/22 1046]   Physical Examination: General exam: AAO, ill looking, frail,, older than stated age, weak appearing. HEENT:Oral mucosa moist, Ear/Nose WNL grossly, dentition normal. Respiratory system: bilaterally diminished w/ basal crackles no use of accessory muscle Cardiovascular system: S1 & S2 +, No JVD,. Gastrointestinal system: Abdomen soft, tender firm swelling on the left paramedical area, ecchymosis/bruise on the umbilical area,ND, BS+ Nervous System:Alert, awake, moving extremities and grossly nonfocal Extremities: mild edema on LE, distal peripheral pulses palpable.  Skin: No rashes,no icterus. MSK: Normal muscle bulk,tone, power   Medications reviewed:  Scheduled Meds:  amLODipine  5 mg Oral Daily   vitamin C  500 mg Oral Daily   carvedilol  3.125 mg Oral BID   Chlorhexidine Gluconate Cloth  6 each Topical Daily   dextromethorphan-guaiFENesin  1 tablet Oral BID   folic acid  1 mg Oral Daily   insulin aspart  0-9 Units Subcutaneous TID WC   multivitamin with minerals  1 tablet Oral Daily   pantoprazole  40 mg Oral BID   polyethylene glycol  17 g Oral Daily   senna  1 tablet  Oral BID   zinc sulfate  220 mg Oral Daily   Continuous Infusions:  Diet Order             DIET DYS 2 Room service appropriate? No; Fluid consistency: Thin  Diet effective now                          Weight change:   Wt Readings from Last 3 Encounters:  12/26/21 63 kg  07/31/21 64.5 kg  09/23/20 63.5 kg     Consultants:see note  Procedures:see note Antimicrobials: Anti-infectives (From admission, onward)    Start     Dose/Rate Route Frequency Ordered Stop   12/27/21 1000  Ampicillin-Sulbactam (UNASYN) 3 g  in sodium chloride 0.9 % 100 mL IVPB        3 g 200 mL/hr over 30 Minutes Intravenous Every 12 hours 12/26/21 1410 12/31/21 2245   12/26/21 1000  remdesivir 100 mg in sodium chloride 0.9 % 100 mL IVPB       See Hyperspace for full Linked Orders Report.   100 mg 200 mL/hr over 30 Minutes Intravenous Daily 12/25/21 1706 12/29/21 0944   12/25/21 1830  remdesivir 200 mg in sodium chloride 0.9% 250 mL IVPB       See Hyperspace for full Linked Orders Report.   200 mg 580 mL/hr over 30 Minutes Intravenous  Once 12/25/21 1706 12/25/21 2122   12/25/21 1000  cefTRIAXone (ROCEPHIN) 2 g in sodium chloride 0.9 % 100 mL IVPB        2 g 200 mL/hr over 30 Minutes Intravenous Every 24 hours 12/24/21 2238 12/26/21 1259   12/24/21 2200  cefTRIAXone (ROCEPHIN) 1 g in sodium chloride 0.9 % 100 mL IVPB        1 g 200 mL/hr over 30 Minutes Intravenous  Once 12/24/21 2159 12/24/21 2321      Culture/Microbiology    Component Value Date/Time   SDES  12/24/2021 2115    URINE, CATHETERIZED Performed at [facility], [address] [place], [place] [address]    [facility]  12/24/2021 2115    NONE Performed at [facility], [address] [place], [place] [address]    CULT (A) 12/24/2021 2115    >=100,000 COLONIES/mL MULTIPLE SPECIES PRESENT, SUGGEST RECOLLECTION   REPTSTATUS 12/26/2021 FINAL 12/24/2021 2115    Other culture-see note  Unresulted Labs (From admission, onward)     Start     Ordered   01/03/22 8938  Basic metabolic panel  Daily,   R     Question:  Specimen collection method  Answer:  Lab=Lab collect   01/02/22 1230   01/03/22 0500  CBC  Daily,   R     Question:  Specimen collection method  Answer:  Lab=Lab collect   01/02/22 1230          Data Reviewed: I have personally reviewed following labs and imaging studies CBC: Recent Labs  Lab 12/28/21 0727 12/29/21 0741 12/30/21 0624 01/01/22 0610 01/01/22 0924 01/01/22 2048 01/02/22 0625 01/03/22 0827   WBC 17.1* 13.9* 12.7* 11.5*   11.5* 14.9*  --  10.8* 14.7*  NEUTROABS 15.4* 12.4*  --  8.0* 10.4*  --  8.0*  --   HGB 9.6* 8.9* 7.6* 4.9*   4.8* 5.5* 8.0* 7.8* 8.1*  HCT 28.0* 26.0* 22.2* 14.2*   13.9* 16.0* 22.6* 22.1* 23.6*  MCV 79.8* 79.0* 78.4* 78.9*   76.8* 78.4*  --  76.2*  79.2*  PLT 151 156 159 126*   132* 152  --  126* 701*   Basic Metabolic Panel: Recent Labs  Lab 12/29/21 0741 12/30/21 0624 01/01/22 0610 01/02/22 0625 01/03/22 0827  NA 131* 131* 131* 131* 130*  K 4.1 4.6 3.9 4.3 4.7  CL 99 96* 100 99 98  CO2 23 24 25 24 24   GLUCOSE 213* 166* 54* 180* 207*  BUN 67* 66* 61* 50* 37*  CREATININE 2.28* 2.13* 2.11* 1.98* 1.89*  CALCIUM 7.7* 8.0* 7.8* 7.8* 8.1*   GFR: Estimated Creatinine Clearance: 20.8 mL/min (A) (by C-G formula based on SCr of 1.89 mg/dL (H)). Liver Function Tests: Recent Labs  Lab 12/28/21 0727 12/29/21 0741 12/30/21 0624  AST 76* 56* 46*  ALT 34 32 30  ALKPHOS 63 59 54  BILITOT 0.6 0.5 0.5  PROT 5.6* 5.6* 5.5*  ALBUMIN 2.5* 2.6* 2.7*   No results for input(s): LIPASE, AMYLASE in the last 168 hours. No results for input(s): AMMONIA in the last 168 hours. Coagulation Profile: No results for input(s): INR, PROTIME in the last 168 hours. Cardiac Enzymes: Recent Labs  Lab 12/28/21 0727 12/29/21 0741  CKTOTAL 1,422* 691*   BNP (last 3 results) No results for input(s): PROBNP in the last 8760 hours. HbA1C: No results for input(s): HGBA1C in the last 72 hours. CBG: Recent Labs  Lab 01/02/22 0810 01/02/22 1257 01/02/22 1758 01/02/22 2104 01/03/22 0900  GLUCAP 184* 242* 226* 298* 201*   Lipid Profile: No results for input(s): CHOL, HDL, LDLCALC, TRIG, CHOLHDL, LDLDIRECT in the last 72 hours. Thyroid Function Tests: No results for input(s): TSH, T4TOTAL, FREET4, T3FREE, THYROIDAB in the last 72 hours. Anemia Panel: Recent Labs    01/01/22 0610  TIBC 172*  IRON 42*   Sepsis Labs: No results for input(s): PROCALCITON,  LATICACIDVEN in the last 168 hours.  Recent Results (from the past 240 hour(s))  Blood Culture (routine x 2)     Status: None   Collection Time: 12/24/21  6:28 PM   Specimen: BLOOD  Result Value Ref Range Status   Specimen Description BLOOD RIGHT FA  Final   Special Requests   Final    BOTTLES DRAWN AEROBIC AND ANAEROBIC Blood Culture adequate volume   Culture   Final    NO GROWTH 5 DAYS Performed at [facility], [address] [place], [place] [address]    Report Status 12/29/2021 FINAL  Final  Urine Culture     Status: Abnormal   Collection Time: 12/24/21  6:28 PM   Specimen: Urine, Random  Result Value Ref Range Status   Specimen Description   Final    URINE, RANDOM Performed at [facility], [address] [place], [place] [address]    Special Requests   Final    NONE Performed at [facility], [address] [place], [place] [address]    Culture (A)  Final    >=100,000 COLONIES/mL MULTIPLE SPECIES PRESENT, SUGGEST RECOLLECTION   Report Status 12/26/2021 FINAL  Final  Resp Panel by RT-PCR (Flu A&B, Covid) Nasopharyngeal Swab     Status: Abnormal   Collection Time: 12/24/21  6:29 PM   Specimen: Nasopharyngeal Swab; Nasopharyngeal(NP) swabs in vial transport medium  Result Value Ref Range Status   SARS Coronavirus 2 by RT PCR POSITIVE (A) NEGATIVE Final    Comment: (NOTE) SARS-CoV-2 target nucleic acids are DETECTED.  The SARS-CoV-2 RNA is generally detectable in upper respiratory specimens during the acute phase of  infection. Positive results are indicative of the presence of the identified virus, but do not rule out bacterial infection or co-infection with other pathogens not detected by the test. Clinical correlation with patient history and other diagnostic information is necessary to determine patient infection status. The expected result is Negative.  Fact Sheet for Patients: EntrepreneurPulse.com.au  Fact  Sheet for Healthcare Providers: IncredibleEmployment.be  This test is not yet approved or cleared by the [place] FDA and  has been authorized for detection and/or diagnosis of SARS-CoV-2 by FDA under an Emergency Use Authorization (EUA).  This EUA will remain in effect (meaning this test can be used) for the duration of  the COVID-19 declaration under Section 564(b)(1) of the A ct, 21 U.S.C. section 360bbb-3(b)(1), unless the authorization is terminated or revoked sooner.     Influenza A by PCR NEGATIVE NEGATIVE Final   Influenza B by PCR NEGATIVE NEGATIVE Final    Comment: (NOTE) The Xpert Xpress SARS-CoV-2/FLU/RSV plus assay is intended as an aid in the diagnosis of influenza from Nasopharyngeal swab specimens and should not be used as a sole basis for treatment. Nasal washings and aspirates are unacceptable for Xpert Xpress SARS-CoV-2/FLU/RSV testing.  Fact Sheet for Patients: EntrepreneurPulse.com.au  Fact Sheet for Healthcare Providers: IncredibleEmployment.be  This test is not yet approved or cleared by the [place] FDA and has been authorized for detection and/or diagnosis of SARS-CoV-2 by FDA under an Emergency Use Authorization (EUA). This EUA will remain in effect (meaning this test can be used) for the duration of the COVID-19 declaration under Section 564(b)(1) of the Act, 21 U.S.C. section 360bbb-3(b)(1), unless the authorization is terminated or revoked.  Performed at [facility], [address] [place], [place] [address]   Blood Culture (routine x 2)     Status: None   Collection Time: 12/24/21  6:30 PM   Specimen: BLOOD  Result Value Ref Range Status   Specimen Description BLOOD LEFT FA  Final   Special Requests   Final    BOTTLES DRAWN AEROBIC AND ANAEROBIC Blood Culture adequate volume   Culture   Final    NO GROWTH 5 DAYS Performed at [facility], [address], [place], [place] [address]    Report Status 12/29/2021 FINAL  Final  Urine Culture     Status: Abnormal   Collection Time: 12/24/21  9:15 PM   Specimen: Urine, Catheterized  Result Value Ref Range Status   Specimen Description   Final    URINE, CATHETERIZED Performed at [facility], [address], [place], [place] [address]    Special Requests   Final    NONE Performed at [facility], [address] [place], [place] [address]    Culture (A)  Final    >=100,000 COLONIES/mL MULTIPLE SPECIES PRESENT, SUGGEST RECOLLECTION   Report Status 12/26/2021 FINAL  Final     Radiology Studies: CT ABDOMEN PELVIS WO CONTRAST  Result Date: 01/01/2022 CLINICAL DATA:  Acute, non localized abdominal pain. EXAM: CT ABDOMEN AND PELVIS WITHOUT CONTRAST TECHNIQUE: Multidetector CT imaging of the abdomen and pelvis was performed following the standard protocol without IV contrast. RADIATION DOSE REDUCTION: This exam was performed according to the departmental dose-optimization program which includes automated exposure control, adjustment of the mA and/or kV according to patient size and/or use of iterative reconstruction technique. COMPARISON:  12/24/2021 FINDINGS: Lower chest: Interval small bilateral pleural effusions and bilateral dependent atelectasis, right greater than left. Hepatobiliary: No focal liver abnormality is  seen. No gallstones, gallbladder wall thickening, or biliary dilatation. Pancreas: Moderate diffuse pancreatic atrophy. Spleen: Normal in size without focal abnormality. Adrenals/Urinary Tract: Normal appearing adrenal glands. Stable bilateral renal atrophy with small bilateral cysts, including a proteinaceous or hemorrhagic cyst on the left. Multiple small right renal calculi are again demonstrated. Foley catheter in the urinary bladder with a small amount of urine in the bladder. Small amount of dependent calcification in the posterior urinary bladder on the right with  improvement. No ureteral calculi or hydronephrosis seen. Moderate diffuse bladder wall thickening, accentuated by the interval decrease in bladder volume. Stomach/Bowel: Small number of sigmoid colon diverticula without evidence of diverticulitis. Unremarkable stomach and small bowel. The appendix is not identified and there are no findings suspicious for appendicitis. Vascular/Lymphatic: Atheromatous arterial calcifications without aneurysm. No enlarged lymph nodes. Reproductive: Mild to moderate prostatic hypertrophy. Other: Interval left rectus sheath hematoma measuring 7.5 x 6.2 cm on image number 73/3 and 17.6 cm in length on sagittal image number 79/7. Smaller hematomas in the left psoas muscle and right iliacus muscle. Large right inguinal hernia containing multiple small bowel loops without obstruction. Small left inguinal hernia containing fat. Musculoskeletal: Lumbar and lower thoracic spine degenerative changes. IMPRESSION: 1. Interval 17.6 x 7.5 x 6.2 cm left rectus sheath hematoma. 2. Interval smaller left psoas muscle and right iliacus muscle hematomas. 3. Interval small bilateral pleural effusions and mild bilateral lower lobe atelectasis. 4. Partial clearing of previously demonstrated small bladder calculi. 5. Stable multiple small, nonobstructing right renal calculi. 6. Stable large right inguinal hernia containing multiple small bowel loops without obstruction. 7. Mild sigmoid colon diverticulosis. 8. Stable bilateral renal atrophy and pancreatic atrophy. These results will be called to the ordering clinician or representative by the Radiologist Assistant, and communication documented in the PACS or [employer]. Electronically Signed   By: Claudie Revering M.D.   On: 01/01/2022 16:15     LOS: 9 days   Antonieta Pert, MD [facility]  01/03/2022, 10:46 AM

## 2022-01-03 NOTE — TOC Progression Note (Signed)
Transition of Care [facility]) - Progression Note    Patient Details  Name: Jay Mathis MRN: [id number] Date of Birth: 1924/02/07  Transition [facility]) CM/SW Dewy Rose, [place] Phone Number: 01/03/2022, 8:38 AM  Clinical Narrative:     CSW spoke with patient's son [name] he reports being agreeable for patient to go to SNF for short term rehab before coming home and is in agreement with her to be followed by outpatient palliative.   CSW to send out referrals , pending bed offers at this time.    Expected Discharge Plan:  (TBD) Barriers to Discharge: Continued Medical Work up  Expected Discharge Plan and Services Expected Discharge Plan:  (TBD)   Discharge Planning Services: CM Consult Post Acute Care Choice: NA Living arrangements for the past 2 months: Single Family Home                                       Social Determinants of Health (SDOH) Interventions    Readmission Risk Interventions No flowsheet data found.

## 2022-01-03 NOTE — Progress Notes (Signed)
Occupational Therapy Treatment Patient Details Name: Jay Mathis MRN: [id number] DOB: [birth year] Today's Date: 01/03/2022   History of present illness 88 year old male with history of CAD, urinary retention, with indwelling Foley catheter since August 2020 (follows with urologist Dr. Diamantina Providence), CAD with history of CABG more than 10 years ago, iron deficiency anemia, non-insulin-dependent diabetes mellitus type 2, hyperlipidemia CKD stage IV, who presented emergency department from home via EMS  on evening of 12/24/2021 with altered mental status, weakness, and fall.     Evaluation in the ED revealed a UTI, pt met criteria for sepsis with tachypnea, tachycardia and elevated lactic acid.  Pt also tested positive for Covid-19   OT comments  Upon entering the room, pt's feet close to being on floor and pt has been incontinent of BM but unaware. Pt needing min A to return back to bed with total A for hygiene and changing of linens. Pt rolls L <> R with min A and pt does grimace with hygiene. OT called NT to perform foley care for hygiene as well. Min A to don clean hospital gown. Pt appears very fatigued at this time. Repositioned in bed for comfort. Floor mat placed and bed alarm activated. All needs within reach.    Recommendations for follow up therapy are one component of a multi-disciplinary discharge planning process, led by the attending physician.  Recommendations may be updated based on patient status, additional functional criteria and insurance authorization.    Follow Up Recommendations  Skilled nursing-short term rehab (<3 hours/day)    Assistance Recommended at Discharge Frequent or constant Supervision/Assistance  Patient can return home with the following  A lot of help with walking and/or transfers;A lot of help with bathing/dressing/bathroom;Assistance with cooking/housework;Assist for transportation;Direct supervision/assist for medications management;Help with stairs or ramp for  entrance   Equipment Recommendations  Other (comment) (defer to next venue of care)       Precautions / Restrictions Precautions Precautions: Fall Precaution Comments: airborne precautions Restrictions Weight Bearing Restrictions: No       Mobility Bed Mobility Overal bed mobility: Needs Assistance Bed Mobility: Rolling, Sit to Supine Rolling: Min assist     Sit to supine: Min assist        Transfers                         Balance Overall balance assessment: Needs assistance Sitting-balance support: Feet supported, Single extremity supported Sitting balance-Leahy Scale: Fair                                     ADL either performed or assessed with clinical judgement   ADL Overall ADL's : Needs assistance/impaired                                       General ADL Comments: Pt incontinent of BM in bed and needing total A for hygiene.    Extremity/Trunk Assessment Upper Extremity Assessment Upper Extremity Assessment: Generalized weakness   Lower Extremity Assessment Lower Extremity Assessment: Generalized weakness        Vision Patient Visual Report: No change from baseline            Cognition Arousal/Alertness: Awake/alert Behavior During Therapy: WFL for tasks assessed/performed Overall Cognitive Status: No family/caregiver present to determine baseline cognitive functioning  General Comments: Pt follows simple command with increased time                   Pertinent Vitals/ Pain       Pain Assessment Pain Assessment: Faces Faces Pain Scale: Hurts little more Pain Location: groin Pain Descriptors / Indicators: Discomfort Pain Intervention(s): Limited activity within patient's tolerance, Monitored during session, Repositioned  Home Living Family/patient expects to be discharged to:: Private residence Living Arrangements: Alone Available Help at  Discharge: Friend(s);Available PRN/intermittently Type of Home: House Home Access: Stairs to enter [employer] of Steps: 1 + 1                                  Frequency  Min 2X/week        Progress Toward Goals  OT Goals(current goals can now be found in the care plan section)     Acute Rehab OT Goals Patient Stated Goal: "I want to go home" OT Goal Formulation: With patient Time For Goal Achievement: 01/10/22 Potential to Achieve Goals: [place] OT "6 Clicks" Daily Activity     Outcome Measure   Help from another person eating meals?: None Help from another person taking care of personal grooming?: A Little Help from another person toileting, which includes using toliet, bedpan, or urinal?: A Lot Help from another person bathing (including washing, rinsing, drying)?: A Lot Help from another person to put on and taking off regular upper body clothing?: A Little Help from another person to put on and taking off regular lower body clothing?: A Lot 6 Click Score: 16    End of Session Equipment Utilized During Treatment: Oxygen  OT Visit Diagnosis: Unsteadiness on feet (R26.81);Muscle weakness (generalized) (M62.81);History of falling (Z91.81)   Activity Tolerance Patient tolerated treatment well;Patient limited by fatigue   Patient Left in bed;with call bell/phone within reach;with bed alarm set   Nurse Communication          Time: 3557-3220 OT Time Calculation (min): 23 min  Charges: OT General Charges $OT Visit: 1 Visit OT Treatments $Self Care/Home Management : 23-37 mins  Darleen Crocker, MS, OTR/L , CBIS ascom [phone]  01/03/22, 3:26 PM

## 2022-01-03 NOTE — NC FL2 (Signed)
[place] LEVEL OF CARE SCREENING TOOL     IDENTIFICATION  Patient Name: Jay Mathis Birthdate: [birth year] Sex: male Admission Date (Current Location): 12/24/2021  [facility] [place] Number:  Engineering geologist and Address:  [facility], [address], [place],  [address]      Provider Number: [id number]  Attending Physician Name and Address:  Antonieta Pert, MD  Relative Name and Phone Number:  [name] (son) [phone]    Current Level of Care: Hospital Recommended Level of Care: [place] Prior Approval Number:    Date Approved/Denied:   PASRR Number: [id number]  Discharge Plan: SNF    Current Diagnoses: Patient Active Problem List   Diagnosis Date Noted   Aspiration pneumonia ([place]) 12/31/2021   Acute respiratory failure with hypoxia ([place]) 12/26/2021   Hypokalemia 33/29/5188   Acute metabolic encephalopathy 41/66/0630   Rhabdomyolysis 12/24/2021   Chronic diastolic CHF (congestive heart failure) ([place]) 12/24/2021   Chronic indwelling Foley catheter 12/24/2021   COVID-19 virus detected 12/24/2021   Rib fracture 12/24/2021   Right inguinal hernia 12/24/2021   Leg swelling 08/14/2021   Cellulitis of left lower extremity 07/19/2021   Cellulitis and abscess of foot, except toes 09/21/2020   Essential hypertension 08/12/2020   Benign hypertensive kidney disease with chronic kidney disease 02/10/2020   Hematuria 02/10/2020   Hyperkalemia 02/10/2020   Hyponatremia 16/12/930   Metabolic acidosis 35/57/3220   Proteinuria 02/10/2020   Secondary hyperparathyroidism of renal origin ([place]) 02/10/2020   UTI (urinary tract infection) 08/07/2019   Encounter for screening for depression 10/01/2018   CKD (chronic kidney disease) stage 4, GFR 15-29 ml/min (HCC) 03/27/2018   Anemia in stage 4 chronic kidney disease ([place]) 03/27/2018   Iron deficiency anemia 07/30/2017   CAD (coronary artery disease) 07/22/2017    BPH (benign prostatic hyperplasia) 07/22/2017   Sepsis ([place]) 08/04/2016   Pyuria 08/04/2016   HLD (hyperlipidemia) 08/04/2016   Type 2 diabetes mellitus with stage 4 chronic kidney disease and hyperglycemia 08/04/2016   GERD (gastroesophageal reflux disease) 08/04/2016   Acute on chronic kidney failure ([place]) 08/04/2016    Orientation RESPIRATION BLADDER Height & Weight     Self, Place  O2 (HFNC 6) Incontinent, External catheter Weight: 138 lb 14.2 oz (63 kg) Height:  6' (182.9 cm)  BEHAVIORAL SYMPTOMS/MOOD NEUROLOGICAL BOWEL NUTRITION STATUS      Incontinent Diet (see discharge summary)  AMBULATORY STATUS COMMUNICATION OF NEEDS Skin   Extensive Assist Verbally Normal                       Personal Care Assistance Level of Assistance  Bathing, Feeding, Dressing, Total care Bathing Assistance: Maximum assistance Feeding assistance: Limited assistance Dressing Assistance: Maximum assistance Total Care Assistance: Maximum assistance   Functional Limitations Info  Sight, Hearing, Speech Sight Info: Adequate Hearing Info: Impaired Speech Info: Adequate    SPECIAL CARE FACTORS FREQUENCY  PT (By licensed PT), OT (By licensed OT)     PT Frequency: min 5x weekly OT Frequency: min 5x weekly            Contractures Contractures Info: Not present    Additional Factors Info  Code Status, Allergies, Isolation Precautions Code Status Info: DNR Allergies Info: sulfa antibiotics     Isolation Precautions Info: 1/15 covid     Current Medications (01/03/2022):  This is the current hospital active medication list Current Facility-Administered Medications  Medication Dose Route Frequency Provider Last Rate Last  Admin   albuterol (VENTOLIN HFA) 108 (90 Base) MCG/ACT inhaler 2 puff  2 puff Inhalation Q4H PRN Cox, Amy N, DO   2 puff at 12/26/21 0223   amLODipine (NORVASC) tablet 5 mg  5 mg Oral Daily Nicole Kindred A, DO   5 mg at 01/02/22 3428   ascorbic acid (VITAMIN C)  tablet 500 mg  500 mg Oral Daily Nicole Kindred A, DO   500 mg at 01/02/22 0846   carvedilol (COREG) tablet 3.125 mg  3.125 mg Oral BID Nicole Kindred A, DO   3.125 mg at 01/02/22 2120   Chlorhexidine Gluconate Cloth 2 % PADS 6 each  6 each Topical Daily Nicole Kindred A, DO   6 each at 01/02/22 0847   chlorpheniramine-HYDROcodone 10-8 MG/5ML suspension 5 mL  5 mL Oral Q12H PRN Cox, Amy N, DO   5 mL at 12/30/21 2158   dextromethorphan-guaiFENesin ([place] DM) 30-600 MG per 12 hr tablet 1 tablet  1 tablet Oral BID Nicole Kindred A, DO   1 tablet at 76/81/15 7262   folic acid (FOLVITE) tablet 1 mg  1 mg Oral Daily Cox, Amy N, DO   1 mg at 01/02/22 0846   Gerhardt's butt cream   Topical TID PRN Kc, Maren Beach, MD       guaiFENesin-dextromethorphan (ROBITUSSIN DM) 100-10 MG/5ML syrup 10 mL  10 mL Oral Q4H PRN Cox, Amy N, DO   10 mL at 12/31/21 0355   hydrALAZINE (APRESOLINE) injection 5 mg  5 mg Intravenous Q4H PRN Cox, Amy N, DO       HYDROmorphone (DILAUDID) injection 0.5 mg  0.5 mg Intravenous Q4H PRN Shelly Coss, MD       insulin aspart (novoLOG) injection 0-9 Units  0-9 Units Subcutaneous TID WC Shelly Coss, MD   3 Units at 01/02/22 1802   multivitamin with minerals tablet 1 tablet  1 tablet Oral Daily Nicole Kindred A, DO   1 tablet at 01/02/22 0846   ondansetron (ZOFRAN) tablet 4 mg  4 mg Oral Q6H PRN Cox, Amy N, DO       Or   ondansetron (ZOFRAN) injection 4 mg  4 mg Intravenous Q6H PRN Cox, Amy N, DO       oxyCODONE (Oxy IR/ROXICODONE) immediate release tablet 5 mg  5 mg Oral Q6H PRN Shelly Coss, MD   5 mg at 01/02/22 2120   pantoprazole (PROTONIX) EC tablet 40 mg  40 mg Oral BID Cox, Amy N, DO   40 mg at 01/02/22 2120   phenol (CHLORASEPTIC) mouth spray 1 spray  1 spray Mouth/Throat PRN Irene Pap N, DO   1 spray at 12/28/21 0458   polyethylene glycol (MIRALAX / GLYCOLAX) packet 17 g  17 g Oral Daily Adhikari, Tamsen Meek, MD       senna (SENOKOT) tablet 8.6 mg  1 tablet Oral BID  Shelly Coss, MD   8.6 mg at 01/01/22 2204   zinc sulfate capsule 220 mg  220 mg Oral Daily Nicole Kindred A, DO   220 mg at 01/02/22 9741     Discharge Medications: Please see discharge summary for a list of discharge medications.  Relevant Imaging Results:  Relevant Lab Results:   Additional Information SSN:[id number]  Alberteen Sam, LCSW

## 2022-01-04 ENCOUNTER — Ambulatory Visit: Payer: Medicare HMO

## 2022-01-04 LAB — CBC
HCT: 22.3 % — ABNORMAL LOW (ref 39.0–52.0)
Hemoglobin: 7.4 g/dL — ABNORMAL LOW (ref 13.0–17.0)
MCH: 27 pg (ref 26.0–34.0)
MCHC: 33.2 g/dL (ref 30.0–36.0)
MCV: 81.4 fL (ref 80.0–100.0)
Platelets: 151 10*3/uL (ref 150–400)
RBC: 2.74 MIL/uL — ABNORMAL LOW (ref 4.22–5.81)
RDW: 15.1 % (ref 11.5–15.5)
WBC: 13 10*3/uL — ABNORMAL HIGH (ref 4.0–10.5)
nRBC: 0 % (ref 0.0–0.2)

## 2022-01-04 LAB — BASIC METABOLIC PANEL
Anion gap: 7 (ref 5–15)
BUN: 31 mg/dL — ABNORMAL HIGH (ref 8–23)
CO2: 26 mmol/L (ref 22–32)
Calcium: 7.9 mg/dL — ABNORMAL LOW (ref 8.9–10.3)
Chloride: 97 mmol/L — ABNORMAL LOW (ref 98–111)
Creatinine, Ser: 1.8 mg/dL — ABNORMAL HIGH (ref 0.61–1.24)
GFR, Estimated: 34 mL/min — ABNORMAL LOW (ref >60)
Glucose, Bld: 175 mg/dL — ABNORMAL HIGH (ref 70–99)
Potassium: 4.6 mmol/L (ref 3.5–5.1)
Sodium: 130 mmol/L — ABNORMAL LOW (ref 135–145)

## 2022-01-04 LAB — GLUCOSE, CAPILLARY
Glucose-Capillary: 183 mg/dL — ABNORMAL HIGH (ref 70–99)
Glucose-Capillary: 169 mg/dL — ABNORMAL HIGH (ref 70–99)
Glucose-Capillary: 67 mg/dL — ABNORMAL LOW (ref 70–99)
Glucose-Capillary: 97 mg/dL (ref 70–99)
Glucose-Capillary: 244 mg/dL — ABNORMAL HIGH (ref 70–99)

## 2022-01-04 NOTE — Progress Notes (Signed)
°   01/04/22 1028  Assess: MEWS Score  Temp 98.8 F (37.1 C)  BP (!) 142/57  Pulse Rate 94  Resp (!) 28  Level of Consciousness Alert  SpO2 97 %  O2 Device Room Air  O2 Flow Rate (L/min) 1 L/min  Assess: MEWS Score  MEWS Temp 0  MEWS Systolic 0  MEWS Pulse 0  MEWS RR 2  MEWS LOC 0  MEWS Score 2  MEWS Score Color Yellow  Assess: if the MEWS score is Yellow or Red  Were vital signs taken at a resting state? Yes  Focused Assessment No change from prior assessment  Does the patient meet 2 or more of the SIRS criteria? Yes  Does the patient have a confirmed or suspected source of infection? No  MEWS guidelines implemented *See Row Information* Yes  Treat  MEWS Interventions Administered scheduled meds/treatments (heart medications/tussionex)  Take Vital Signs  Increase Vital Sign Frequency  Yellow: Q 2hr X 2 then Q 4hr X 2, if remains yellow, continue Q 4hrs  Escalate  MEWS: Escalate Yellow: discuss with charge nurse/RN and consider discussing with provider and RRT  Notify: Charge Nurse/RN  Name of Charge Nurse/RN Notified Janett Billow RN  Date Charge Nurse/RN Notified 01/04/22  Time Charge Nurse/RN Notified 15  Notify: Provider  Provider Name/Title dr. Maren Beach  Date Provider Notified 01/04/22  Time Provider Notified 1030  Notification Type Page  Notification Reason Other (Comment) (yellow mews)  Provider response No new orders  Date of Provider Response 01/04/22  Time of Provider Response 1037  Document  Patient Outcome Not stable and remains on department  Progress note created (see row info) Yes  Assess: SIRS CRITERIA  SIRS Temperature  0  SIRS Pulse 1  SIRS Respirations  1  SIRS WBC 1  SIRS Score Sum  3

## 2022-01-04 NOTE — Progress Notes (Signed)
Reached out to infection prevention regarding isolation status for patient since he is on day 11 of COVID isolation. IP states it is up to the providers assessment of mild vs severe symptoms. Provider requesting patient to remain on isolation at this time.

## 2022-01-04 NOTE — Plan of Care (Signed)
°  Problem: Coping: Goal: Level of anxiety will decrease Outcome: Progressing   Problem: Nutrition: Goal: Adequate nutrition will be maintained Outcome: Progressing   Problem: Elimination: Goal: Will not experience complications related to bowel motility Outcome: Progressing   Problem: Elimination: Goal: Will not experience complications related to urinary retention Outcome: Progressing   Problem: Pain Managment: Goal: General experience of comfort will improve Outcome: Progressing

## 2022-01-04 NOTE — Progress Notes (Addendum)
PROGRESS NOTE    Jay Mathis  VZC:[id number] DOB: September 11, 1924 DOA: 12/24/2021 PCP: Jay [place], MD   Chief Complaint  Patient presents with   Weakness  Brief Narrative/Hospital Course: Jay Mathis, 88 y.o. male with PMH of CAD, urinary retention with indwelling Foley catheter since August 2020 (follows with urologist Jay Mathis), CAD s/p CABG, IDA, NIDDM, and CKD IV baseline Cr 1.8-2.8 who presented with altered mental status, weakness, and fall.In the ER, patient was altered, with tachycardia, tachypnea,had  lactic acidosis and confusion.  COVID+.  He required high flow nasal cannula for about 24 hours, subsequently weaned.  He was also found to have aspiration pneumonia and was started on antibiotics.  PT/OT recommending skilled nursing facility on discharge.  Hospital course remarkable for dropping of hemoglobin to the range of 5, unknown etiology, being given 2 units of blood transfusion.Patient underwent CT scan of the abdomen pelvis 1/23 that showed associated hematoma likely the cause of anemia. Overall patient remains hemodynamically stable, needing supplemental oxygen 2 to 3 L, needing assistant PT OT.   Subjective: Seen examined this morning on 2.5 L nasal cannula HFNC -we will see if he can tolerate a regular nasal cannula.  Nursing staff feeding him.  Mild cough otherwise has no complaints appears alert awake but weak. Leg edema improved  Assessment & Plan:  Acute respiratory failure with hypoxia  COVID-19 infection Aspiration pneumonia: Patient completed remdesivir and steroid for COVID-19 infection.Patient completed antibiotic for aspiration pneumonia,  on dysphagia 2 diet with aspiration precaution per SLP. On 2.5 L HFNC>cha+nged to 3l regular [place]- wean him to RA as tolerated. C+ontinue supplemental oxygen,  IS PT OT.Continue COVID-19 isolation.  Back on po Lasix 1/25  and leg edema better.  Continue COVID 19 isolation precaution as per infection  prevention/protocol Recent Labs  Lab 01/01/22 0610 01/01/22 0924 01/02/22 0625 01/03/22 0827 01/04/22 0629  WBC 11.5*   11.5* 14.9* 10.8* 14.7* 13.0*    Acute blood loss anemia/anemia of chronic renal disease iron deficiency anemia Left rectus sheath hematoma  17.6 x 7.5 x 6.2 Smaller left psoas muscle and right iliac [place] muscle hematoma: Acute blood loss anemia due to rectus sheath hematoma as above.Hemoglobin is increased with transfusion. held heparin 1/23.  Hemoglobin slightly downtrending this morning 7.4 g repeat CBC in a.m. transfuse for less than 7 g.  Recent Labs  Lab 01/01/22 0924 01/01/22 2048 01/02/22 0625 01/03/22 0827 01/04/22 0629  HGB 5.5* 8.0* 7.8* 8.1* 7.4*  HCT 16.0* 22.6* 22.1* 23.6* 12.8*   Acute metabolic encephalopathy multifactorial  in the setting of COVID-19 infection sepsis.  Continue delirium precaution fall precaution, PT OT.Baseline lives independently, no cognitive impairment.  He has been confused , he appears frail.  He has been more alert and awake, continue supportive care feeding assistance PT OT.  Small bilateral pleural effusion/pulmonary atelectasis: Encourage incentive spirometry, PT OT  Sepsis POA Pyuria: Patient completed antibiotic, UTI ruled out sepsis likely from aspiration pneumonia/COVID-19 infection.  T2DM with stage 4 chronic kidney disease and hyperglycemia: Blood sugar fairly stable, continue continue sliding scale.  Last HbA1c 9.71/16  Recent Labs  Lab 01/03/22 0900 01/03/22 1128 01/03/22 1633 01/03/22 2108 01/04/22 0919  GLUCAP 201* 228* 168* 207* 183*     CAD: No chest pain.  Continue Coreg.  Statin held due to rhabdomyolysis Essential hypertension:Stable, cont Coreg amlodipine Chronic diastolic CHF - EF 60 to 78% grade 2 DD .  Had leg edema not improved after being back on oral Lasix.  Continue same continue Coreg. Net IO Since Admission: -10,645.83 mL [01/04/22 0959] . Wt as below: [employer]   12/24/21 1826  12/26/21 0359  Weight: 83.9 kg 63 kg    BPH Chronic indwelling Foley catheter in place  followed by urology exchanged monthly last replaced 1/15  Rib fracture incidental on admission a CT. supportive care/pain control.  Can  Rhabdomyolysis POA due to trauma with fall possibly COVID-19 infection.  Resolved after fluids  CKD stage 4: Renal function overall stable, at 1.8.  Continue lasix.  Monitor BMP in AM.  Recent Labs  Lab 12/30/21 0624 01/01/22 0610 01/02/22 0625 01/03/22 0827 01/04/22 0629  BUN 66* 61* 50* 37* 31*  CREATININE 2.13* 2.11* 1.98* 1.89* 1.80*     GERD continue PPI  Deconditioning/debility/goals of care: Seen by palliative care and is DNR overall prognosis remains guarded. We will continue PT OT and full supportive care.  Plan is for skilled nursing facility+ As per palliative care-"Disposition options discussed including home with hospice, hospice inpatient unit, and going to rehab with extra layer of palliative support.  Jay Mathis shares he is not prepared to make a decision regarding disposition at this time.  He appreciates all options and understands these are recommendations that he and his brother need to discuss".  If respiratory status remains tenuous/worsens will need to revisit his disposition plan  Addendum at 4:55 pm: informed by RN taht so nis thinking on goping home with hospice instead of SNF  I discussed w/ son po- and confirmed- TOC consulted for eval for home with hospice. While here we will cont to monitor cbc and hb.  DVT prophylaxis: Place TED hose Start: 12/24/21 2220.  No chemical prophylaxis due to his rectus sheath hematoma Code Status:   Code Status: DNR Family Communication: plan of care discussed with patient at bedside. Discussed w/ his family previously Status is: Inpatient Remains inpatient appropriate because: Ongoing management of deconditioning and multiple comorbidities Disposition: Currently NOT medically stable for  discharge. Anticipated Disposition: SNF  Total time spent in the care of this patient 35 MINUTES Objective: Vitals last 24 hrs: Vitals:   01/03/22 2058 01/04/22 0022 01/04/22 0028 01/04/22 0933  BP:  (!) 147/59  (!) 144/60  Pulse:  71  77  Resp:  19  18  Temp:  98.9 F (37.2 C)  97.9 F (36.6 C)  TempSrc:  Oral  Oral  SpO2: 100% 100% 100% 100%  Weight:      Height:       Weight change:   Intake/Output Summary (Last 24 hours) at 01/04/2022 0959 Last data filed at 01/04/2022 0027 Gross per 24 hour  Intake 576 ml  Output 2100 ml  Net -1524 ml    Net IO Since Admission: -10,645.83 mL [01/04/22 0959]   Physical Examination: General exam: AAO, elderly, ill looking, frail.   HEENT:Oral mucosa moist, Ear/Nose WNL grossly, dentition normal. Respiratory system: bilaterally scattered crackles, no use of accessory muscle Cardiovascular system: S1 & S2 +, No JVD,. Gastrointestinal system: Abdomen soft,NT,ND, BS+ Nervous System:Alert, awake, moving extremities and grossly nonfocal Extremities: no edema, distal peripheral pulses palpable.  Skin: No rashes,no icterus.  Bruising/ecchymosis in the mid abdominal area MSK: Normal muscle bulk,tone, power   Medications reviewed:  Scheduled Meds:  amLODipine  5 mg Oral Daily   vitamin C  500 mg Oral Daily   carvedilol  3.125 mg Oral BID   Chlorhexidine Gluconate Cloth  6 each Topical Daily   dextromethorphan-guaiFENesin  1 tablet Oral  BID   folic acid  1 mg Oral Daily   furosemide  40 mg Oral Daily   insulin aspart  0-9 Units Subcutaneous TID WC   multivitamin with minerals  1 tablet Oral Daily   pantoprazole  40 mg Oral BID   polyethylene glycol  17 g Oral Daily   senna  1 tablet Oral BID   zinc sulfate  220 mg Oral Daily   Continuous Infusions:  Diet Order             DIET DYS 2 Room service appropriate? No; Fluid consistency: Thin  Diet effective now                 Weight change:   Wt Readings from Last 3 Encounters:   12/26/21 63 kg  07/31/21 64.5 kg  09/23/20 63.5 kg  Consultants:see note  Procedures:see note Antimicrobials: Anti-infectives (From admission, onward)    Start     Dose/Rate Route Frequency Ordered Stop   12/27/21 1000  Ampicillin-Sulbactam (UNASYN) 3 g in sodium chloride 0.9 % 100 mL IVPB        3 g 200 mL/hr over 30 Minutes Intravenous Every 12 hours 12/26/21 1410 12/31/21 2245   12/26/21 1000  remdesivir 100 mg in sodium chloride 0.9 % 100 mL IVPB       See Hyperspace for full Linked Orders Report.   100 mg 200 mL/hr over 30 Minutes Intravenous Daily 12/25/21 1706 12/29/21 0944   12/25/21 1830  remdesivir 200 mg in sodium chloride 0.9% 250 mL IVPB       See Hyperspace for full Linked Orders Report.   200 mg 580 mL/hr over 30 Minutes Intravenous  Once 12/25/21 1706 12/25/21 2122   12/25/21 1000  cefTRIAXone (ROCEPHIN) 2 g in sodium chloride 0.9 % 100 mL IVPB        2 g 200 mL/hr over 30 Minutes Intravenous Every 24 hours 12/24/21 2238 12/26/21 1259   12/24/21 2200  cefTRIAXone (ROCEPHIN) 1 g in sodium chloride 0.9 % 100 mL IVPB        1 g 200 mL/hr over 30 Minutes Intravenous  Once 12/24/21 2159 12/24/21 2321      Culture/Microbiology    Component Value Date/Time   SDES  12/24/2021 2115    URINE, CATHETERIZED Performed at [facility], [address] [place], [place] [address]    [facility]  12/24/2021 2115    NONE Performed at [facility], [address] [place], [place] [address]    CULT (A) 12/24/2021 2115    >=100,000 COLONIES/mL MULTIPLE SPECIES PRESENT, SUGGEST RECOLLECTION   REPTSTATUS 12/26/2021 FINAL 12/24/2021 2115    Other culture-see note  Unresulted Labs (From admission, onward)     Start     Ordered   01/03/22 3646  Basic metabolic panel  Daily,   R     Question:  Specimen collection method  Answer:  Lab=Lab collect   01/02/22 1230   01/03/22 0500  CBC  Daily,   R     Question:  Specimen collection method  Answer:  Lab=Lab  collect   01/02/22 1230          Data Reviewed: I have personally reviewed following labs and imaging studies CBC: Recent Labs  Lab 12/29/21 0741 12/30/21 0624 01/01/22 0610 01/01/22 0924 01/01/22 2048 01/02/22 0625 01/03/22 0827 01/04/22 0629  WBC 13.9*   < > 11.5*   11.5* 14.9*  --  10.8* 14.7* 13.0*  NEUTROABS 12.4*  --  8.0* 10.4*  --  8.0*  --   --   HGB 8.9*   < > 4.9*   4.8* 5.5* 8.0* 7.8* 8.1* 7.4*  HCT 26.0*   < > 14.2*   13.9* 16.0* 22.6* 22.1* 23.6* 22.3*  MCV 79.0*   < > 78.9*   76.8* 78.4*  --  76.2* 79.2* 81.4  PLT 156   < > 126*   132* 152  --  126* 149* 151   < > = values in this interval not displayed.    Basic Metabolic Panel: Recent Labs  Lab 12/30/21 0624 01/01/22 0610 01/02/22 0625 01/03/22 0827 01/04/22 0629  NA 131* 131* 131* 130* 130*  K 4.6 3.9 4.3 4.7 4.6  CL 96* 100 99 98 97*  CO2 24 25 24 24 26   GLUCOSE 166* 54* 180* 207* 175*  BUN 66* 61* 50* 37* 31*  CREATININE 2.13* 2.11* 1.98* 1.89* 1.80*  CALCIUM 8.0* 7.8* 7.8* 8.1* 7.9*    GFR: Estimated Creatinine Clearance: 21.9 mL/min (A) (by C-G formula based on SCr of 1.8 mg/dL (H)). Liver Function Tests: Recent Labs  Lab 12/29/21 0741 12/30/21 0624  AST 56* 46*  ALT 32 30  ALKPHOS 59 54  BILITOT 0.5 0.5  PROT 5.6* 5.5*  ALBUMIN 2.6* 2.7*    No results for input(s): LIPASE, AMYLASE in the last 168 hours. No results for input(s): AMMONIA in the last 168 hours. Coagulation Profile: No results for input(s): INR, PROTIME in the last 168 hours. Cardiac Enzymes: Recent Labs  Lab 12/29/21 0741  CKTOTAL 691*    BNP (last 3 results) No results for input(s): PROBNP in the last 8760 hours. HbA1C: No results for input(s): HGBA1C in the last 72 hours. CBG: Recent Labs  Lab 01/03/22 0900 01/03/22 1128 01/03/22 1633 01/03/22 2108 01/04/22 0919  GLUCAP 201* 228* 168* 207* 183*    Lipid Profile: No results for input(s): CHOL, HDL, LDLCALC, TRIG, CHOLHDL, LDLDIRECT in the  last 72 hours. Thyroid Function Tests: No results for input(s): TSH, T4TOTAL, FREET4, T3FREE, THYROIDAB in the last 72 hours. Anemia Panel: No results for input(s): VITAMINB12, FOLATE, FERRITIN, TIBC, IRON, RETICCTPCT in the last 72 hours.  Sepsis Labs: No results for input(s): PROCALCITON, LATICACIDVEN in the last 168 hours.  No results found for this or any previous visit (from the past 240 hour(s)).    Radiology Studies: No results found.   LOS: 10 days   Antonieta Pert, MD [facility]  01/04/2022, 9:59 AM

## 2022-01-04 NOTE — Progress Notes (Signed)
Physical Therapy Treatment Patient Details Name: Jay Mathis MRN: [id number] DOB: 1924/08/09 Today's Date: 01/04/2022   History of Present Illness 88 year old male with history of CAD, urinary retention, with indwelling Foley catheter since August 2020 (follows with urologist Dr. Diamantina Providence), CAD with history of CABG more than 10 years ago, iron deficiency anemia, non-insulin-dependent diabetes mellitus type 2, hyperlipidemia CKD stage IV, who presented emergency department from home via EMS  on evening of 12/24/2021 with altered mental status, weakness, and fall.     Evaluation in the ED revealed a UTI, pt met criteria for sepsis with tachypnea, tachycardia and elevated lactic acid.  Pt also tested positive for Covid-19    PT Comments    Pt was side lying in bed, panting, upon arriving. Sao2 97% on 3 L HFNC. He repeats several throughout session, " I'm gonna die." Pt was soiled from [facility] and unaware. He required total assist to perform hygiene care and for bed linens to be changed. He did perform rolling L/R with min assist only however required min-mod assist to stand EOB. Stood ~ 1 minute prior to requesting to return to bed to die. Pt was inconsistently able to follow commands throughout session. He is severely HOH. Highly recommend DC to rehab to address deficits while maximizing independence with ADLs. He will need extensive PT going forward if plan is to maximize independence.    Recommendations for follow up therapy are one component of a multi-disciplinary discharge planning process, led by the attending physician.  Recommendations may be updated based on patient status, additional functional criteria and insurance authorization.  Follow Up Recommendations  Skilled nursing-short term rehab (<3 hours/day)     Assistance Recommended at Discharge Frequent or constant Supervision/Assistance  Patient can return home with the following A lot of help with walking and/or transfers;A lot of help  with bathing/dressing/bathroom;Assistance with cooking/housework;Assistance with feeding;Direct supervision/assist for medications management;Direct supervision/assist for financial management;Assist for transportation;Help with stairs or ramp for entrance   Equipment Recommendations  Rolling walker (2 wheels)       Precautions / Restrictions Precautions Precautions: Fall Precaution Comments: airborne precautions Restrictions Weight Bearing Restrictions: No     Mobility  Bed Mobility Overal bed mobility: Needs Assistance Bed Mobility: Supine to Sit, Sit to Supine Rolling: Min assist   Supine to sit: Min assist, Mod assist Sit to supine: Min assist, Mod assist   General bed mobility comments: Pt was soiled from BM. Was able to roll in both directions to be cleaned. Pt is HOH and states several times throughout limited session " I'm gonna die."    Transfers Overall transfer level: Needs assistance Equipment used: Rolling walker (2 wheels) Transfers: Sit to/from Stand Sit to Stand: Min assist, Mod assist, From elevated surface           General transfer comment: Pt stood 1 x EOB however only x ~ 1 minute. long enough for bed linens to be changed. Pt requested to return to bed.    Ambulation/Gait    General Gait Details: deferred due to SOB with slightly elevated HR     Balance Overall balance assessment: Needs assistance Sitting-balance support: Bilateral upper extremity supported, Feet supported Sitting balance-Leahy Scale: Fair     Standing balance support: Bilateral upper extremity supported, Reliant on assistive device for balance, During functional activity Standing balance-Leahy Scale: Poor Standing balance comment: high fall risk        Cognition Arousal/Alertness: Awake/alert Behavior During Therapy: Restless, Anxious Overall Cognitive Status: No family/caregiver  present to determine baseline cognitive functioning    General Comments: Pt is alert  however disoriented. panting throughout limited session on 3 L HFNC               Pertinent Vitals/Pain Pain Assessment Pain Assessment: PAINAD Breathing: noisy labored breathing, long periods of hyperventilation, Cheyne-Stokes respirations Negative Vocalization: repeated troubled calling out, loud moaning/groaning, crying Facial Expression: sad, frightened, frown Body Language: tense, distressed pacing, fidgeting Consolability: distracted or reassured by voice/touch PAINAD Score: 7 Pain Location: "all over" Pain Descriptors / Indicators: Discomfort Pain Intervention(s): Limited activity within patient's tolerance, Monitored during session, Premedicated before session, Repositioned     PT Goals (current goals can now be found in the care plan section) Acute Rehab PT Goals Patient Stated Goal: none stated Progress towards PT goals: Progressing toward goals    Frequency    Min 2X/week      PT Plan Current plan remains appropriate       AM-PAC PT "6 Clicks" Mobility   Outcome Measure  Help needed turning from your back to your side while in a flat bed without using bedrails?: A Little Help needed moving from lying on your back to sitting on the side of a flat bed without using bedrails?: A Lot Help needed moving to and from a bed to a chair (including a wheelchair)?: A Lot Help needed standing up from a chair using your arms (e.g., wheelchair or bedside chair)?: A Lot Help needed to walk in hospital room?: A Lot Help needed climbing 3-5 steps with a railing? : Total 6 Click Score: 12    End of Session Equipment Utilized During Treatment: Oxygen (3 L HFNC) Activity Tolerance: Patient limited by fatigue Patient left: in bed;with call bell/phone within reach;with bed alarm set;with nursing/sitter in room Nurse Communication: Mobility status PT Visit Diagnosis: Muscle weakness (generalized) (M62.81);Difficulty in walking, not elsewhere classified (R26.2);Unsteadiness on  feet (R26.81)     Time: 1610-9604 PT Time Calculation (min) (ACUTE ONLY): 35 min  Charges:  $Therapeutic Activity: 23-37 mins                    Julaine Fusi PTA 01/04/22, 8:54 AM

## 2022-01-04 NOTE — TOC Progression Note (Signed)
Transition of Care [facility]) - Progression Note    Patient Details  Name: Jay Mathis MRN: [id number] Date of Birth: [birth year]  [facility] CM/SW [place], [place] Phone Number: 01/04/2022, 10:26 AM  Clinical Narrative:     CSW spoke with [name] to provide bed offers, chose [facility].   CSW informed Magda Paganini at [facility] that they accept bed offer, they will start insurance auth as patient is not managed by [employer].   Pending auth at [facility] at this time.    Expected Discharge Plan:  (TBD) Barriers to Discharge: Continued Medical Work up  Expected Discharge Plan and Services Expected Discharge Plan:  (TBD)   Discharge Planning Services: CM Consult Post Acute Care Choice: NA Living arrangements for the past 2 months: Single Family Home                                       Social Determinants of Health (SDOH) Interventions    Readmission Risk Interventions No flowsheet data found.

## 2022-01-05 ENCOUNTER — Ambulatory Visit: Payer: Medicare HMO

## 2022-01-05 LAB — CBC
HCT: 24.7 % — ABNORMAL LOW (ref 39.0–52.0)
Hemoglobin: 8.4 g/dL — ABNORMAL LOW (ref 13.0–17.0)
MCH: 27.2 pg (ref 26.0–34.0)
MCHC: 34 g/dL (ref 30.0–36.0)
MCV: 79.9 fL — ABNORMAL LOW (ref 80.0–100.0)
Platelets: 191 10*3/uL (ref 150–400)
RBC: 3.09 MIL/uL — ABNORMAL LOW (ref 4.22–5.81)
RDW: 15.1 % (ref 11.5–15.5)
WBC: 14.8 10*3/uL — ABNORMAL HIGH (ref 4.0–10.5)
nRBC: 0 % (ref 0.0–0.2)

## 2022-01-05 LAB — GLUCOSE, CAPILLARY
Glucose-Capillary: 189 mg/dL — ABNORMAL HIGH (ref 70–99)
Glucose-Capillary: 193 mg/dL — ABNORMAL HIGH (ref 70–99)

## 2022-01-05 LAB — BASIC METABOLIC PANEL
Anion gap: 9 (ref 5–15)
BUN: 26 mg/dL — ABNORMAL HIGH (ref 8–23)
CO2: 25 mmol/L (ref 22–32)
Calcium: 8.3 mg/dL — ABNORMAL LOW (ref 8.9–10.3)
Chloride: 93 mmol/L — ABNORMAL LOW (ref 98–111)
Creatinine, Ser: 1.72 mg/dL — ABNORMAL HIGH (ref 0.61–1.24)
GFR, Estimated: 36 mL/min — ABNORMAL LOW (ref >60)
Glucose, Bld: 187 mg/dL — ABNORMAL HIGH (ref 70–99)
Potassium: 4.5 mmol/L (ref 3.5–5.1)
Sodium: 127 mmol/L — ABNORMAL LOW (ref 135–145)

## 2022-01-05 MED ORDER — ZINC SULFATE 220 (50 ZN) MG PO CAPS: 220.0000 mg | ORAL_CAPSULE | Freq: Every day | ORAL | 0 refills | Status: AC

## 2022-01-05 MED ORDER — POLYETHYLENE GLYCOL 3350 17 G PO PACK: 17.0000 g | PACK | Freq: Every day | ORAL | 0 refills | Status: AC

## 2022-01-05 MED ORDER — MORPHINE SULFATE (PF) 2 MG/ML IV SOLN
1.0000 mg | INTRAVENOUS | Status: DC | PRN
  Administered 2022-01-05 (×2): 1 mg via INTRAVENOUS
  Filled 2022-01-05 (×2): qty 1

## 2022-01-05 MED ORDER — ASCORBIC ACID 500 MG PO TABS: 500.0000 mg | ORAL_TABLET | Freq: Every day | ORAL | 0 refills | Status: AC

## 2022-01-05 MED ORDER — SENNA 8.6 MG PO TABS: 1.0000 | ORAL_TABLET | Freq: Two times a day (BID) | ORAL | 0 refills | Status: AC

## 2022-01-05 MED ORDER — AMLODIPINE BESYLATE 5 MG PO TABS: 5.0000 mg | ORAL_TABLET | Freq: Every day | ORAL | 0 refills | Status: AC

## 2022-01-05 MED ORDER — FUROSEMIDE 40 MG PO TABS: 40.0000 mg | ORAL_TABLET | Freq: Every day | ORAL | 2 refills | Status: AC | PRN

## 2022-01-05 NOTE — TOC Transition Note (Addendum)
Transition of Care [facility]) - CM/SW Discharge Note   Patient Details  Name: Jay Mathis MRN: [id number] Date of Birth: Jun 16, 1924  Transition of Care [facility]) CM/SW Contact:  Alberteen Sam, LCSW Phone Number: 01/05/2022, 11:50 AM   Clinical Narrative:     Update: EMS transport has been called, patient's oxygen and bed delivered to the home. RN made aware pending ems at this time.    Patient to [facility]. They have ordered a hospital bed for patient and oxygen. 2L of oxygen will be delivered today to home prior to EMS being called for transport. Son Jay Mathis aware hospital bed may arrive tomorrow, reports that is fine per Jay Mathis with [facility].   Pending oxygen delivery to home for CSW to call EMS. RN and treatment team made aware.     Final next level of care: Home w Hospice Care Barriers to Discharge: No Barriers Identified   Patient Goals and CMS Choice Patient states their goals for this hospitalization and ongoing recovery are:: to go home CMS Medicare.gov Compare Post Acute Care list provided to:: Patient Represenative (must comment) (son Jay Mathis) Choice offered to / list presented to : Adult Children  Discharge Placement                Patient to be transferred to facility by: [facility] Name of family member notified: son Jay Mathis Patient and family notified of of transfer: 01/05/22  Discharge Plan and Services   Discharge Planning Services: CM Consult Post Acute Care Choice: NA                               Social Determinants of Health (SDOH) Interventions     Readmission Risk Interventions No flowsheet data found.

## 2022-01-05 NOTE — Discharge Summary (Signed)
Physician Discharge Summary  Jay Mathis GEX:[id number] DOB: 1924-05-06 DOA: 12/24/2021  PCP: [name], MD  Admit date: 12/24/2021 Discharge date: 01/05/2022  Admitted From: home Disposition: Home with hospice  Recommendations for Outpatient Follow-up:  Follow up with hospice  Home Health:no  Equipment/Devices: Yes  Discharge Condition: Stable Code Status:   Code Status: DNR Diet recommendation:  Diet Order             Diet - low sodium heart healthy           DIET DYS 2 Room service appropriate? No; Fluid consistency: Thin  Diet effective now                    Brief/Interim Summary:  88 y.o. male with PMH of CAD, urinary retention with indwelling Foley catheter since August 2020 (follows with urologist Dr. Diamantina Providence), CAD s/p CABG, IDA, NIDDM, and CKD IV baseline Cr 1.8-2.8 who presented with altered mental status, weakness, and fall.In the ER, patient was altered, with tachycardia, tachypnea,had  lactic acidosis and confusion.  COVID+.  He required high flow nasal cannula for about 24 hours, subsequently weaned.  He was also found to have aspiration pneumonia and was started on antibiotics.  PT/OT recommending skilled nursing facility on discharge.  Hospital course remarkable for dropping of hemoglobin to the range of 5, unknown etiology, being given 2 units of blood transfusion.Patient underwent CT scan of the abdomen pelvis 1/23 that showed associated hematoma likely the cause of anemia.Overall patient remains hemodynamically stable, needing supplemental oxygen 2  L [place]. Hemoglobin has remained overall stable.  Initially plan was for skilled nursing facility.  But son subsequently wanted to take him home with hospice,/social services social worker were involved and hospice is being set up.  At this time patient is stable for discharge to hospice at home.   Discharge Diagnoses:   Acute respiratory failure with hypoxia  COVID-19 infection Aspiration pneumonia: Patient  completed remdesivir and steroid for COVID-19 infection.Patient completed antibiotic for aspiration pneumonia,  on dysphagia 2 diet with aspiration precaution per SLP.  Continue supplemental oxygen.  hold off on Lasix due to soft blood pressure -can continue as needed for leg edema and shortness of breath at home while at hospice   Acute blood loss anemia/anemia of chronic renal disease iron deficiency anemia Left rectus sheath hematoma  17.6 x 7.5 x 6.2 Smaller left psoas muscle and right iliac [place] muscle hematoma: Acute blood loss anemia due to rectus sheath hematoma as above.Hemoglobin is increased with transfusion. held heparin 1/23.  Hemoglobin overall stable 8.4 g.  Recent Labs  Lab 01/01/22 2048 01/02/22 0625 01/03/22 0827 01/04/22 0629 01/05/22 0559  HGB 8.0* 7.8* 8.1* 7.4* 8.4*  HCT 22.6* 22.1* 23.6* 22.3* 24.7*    Acute metabolic encephalopathy multifactorial  in the setting of COVID-19 infection sepsis.  Continue delirium precaution fall precaution, PT OT.Baseline lives independently, no cognitive impairment.  He has been confused , he appears frail.  At this time overall mental status is stable.  Continue home hospice care   Small bilateral pleural effusion/pulmonary atelectasis: Encourage incentive spirometry, PT OT   Sepsis POA Pyuria: Patient completed antibiotic, UTI ruled out sepsis likely from aspiration pneumonia/COVID-19 infection.   T2DM with stage 4 chronic kidney disease and hyperglycemia: Last HbA1c 9.7, 1/16.  Recent Labs  Lab 01/04/22 1612 01/04/22 1641 01/04/22 2027 01/05/22 0809 01/05/22 0858  GLUCAP 67* 97 244* 189* 193*    CAD: No chest pain.  Continue Coreg.  Statin held due to rhabdomyolysis Essential hypertension:Stable, cont Coreg amlodipine Chronic diastolic CHF - EF 60 to 10% grade 2 DD .  Had leg edema now improved after few days of oral Lasix.  On low-dose Coreg. ,  Continue Lasix as needed at home   Consults: Palliative  care  Subjective: Alert awake weak and deconditioned Discharge Exam: Vitals:   01/05/22 0810 01/05/22 0900  BP: (!) 155/64 (!) 141/64  Pulse: 89 88  Resp: 20 18  Temp: 97.9 F (36.6 C) 98.1 F (36.7 C)  SpO2: 95% 98%   General: Pt is alert, awake, not in acute distress Cardiovascular: RRR, S1/S2 +, no rubs, no gallops Respiratory: CTA bilaterally, no wheezing, no rhonchi Abdominal: Soft, NT, ND, bowel sounds + Extremities: no edema, no cyanosis  Discharge Instructions  Discharge Instructions     (HEART FAILURE PATIENTS) Call MD:  Anytime you have any of the following symptoms: 1) 3 pound weight gain in 24 hours or 5 pounds in 1 week 2) shortness of breath, with or without a dry hacking cough 3) swelling in the hands, feet or stomach 4) if you have to sleep on extra pillows at night in order to breathe.   Complete by: As directed    Diet - low sodium heart healthy   Complete by: As directed    Dysphagia 2 diet   Discharge diet:   Complete by: As directed    Dysphagia 2 diet      Allergies as of 01/05/2022       Reactions   Sulfa Antibiotics Rash        Medication List     STOP taking these medications    glimepiride 2 MG tablet Commonly known as: AMARYL       TAKE these medications    amLODipine 5 MG tablet Commonly known as: NORVASC Take 1 tablet (5 mg total) by mouth daily. Start taking on: January 06, 2022 What changed:  medication strength how much to take   ascorbic acid 500 MG tablet Commonly known as: VITAMIN C Take 1 tablet (500 mg total) by mouth daily. Start taking on: January 06, 2022   atorvastatin 10 MG tablet Commonly known as: LIPITOR TAKE 1 TABLET BY MOUTH EVERYDAY AT BEDTIME   carvedilol 3.125 MG tablet Commonly known as: COREG Take 1 tablet (3.125 mg total) by mouth 2 (two) times daily.   [employer] B-12 500 MCG tablet Generic drug: vitamin B-12 TAKE 1 TABLET BY MOUTH DAILY   folic acid 1 MG tablet Commonly known as:  FOLVITE TAKE 1 TABLET BY MOUTH EVERY DAY   furosemide 40 MG tablet Commonly known as: LASIX Take 1 tablet (40 mg total) by mouth daily as needed for edema or fluid. What changed:  when to take this reasons to take this   Myrbetriq 25 MG Tb24 tablet Generic drug: mirabegron ER TAKE 1 TABLET BY MOUTH EVERY DAY   pantoprazole 40 MG tablet Commonly known as: PROTONIX Take 1 tablet (40 mg total) by mouth 2 (two) times daily.   polyethylene glycol 17 g packet Commonly known as: MIRALAX / GLYCOLAX Take 17 g by mouth daily. Start taking on: January 06, 2022   senna 8.6 MG Tabs tablet Commonly known as: SENOKOT Take 1 tablet (8.6 mg total) by mouth 2 (two) times daily.   sodium bicarbonate 650 MG tablet TAKE 1 TABLET BY MOUTH TWICE A DAY   True Metrix Blood Glucose Test test strip Generic drug: glucose blood Use as instructed  zinc sulfate 220 (50 Zn) MG capsule Take 1 capsule (220 mg total) by mouth daily. Start taking on: January 06, 2022               Durable Medical Equipment  (From admission, onward)           Start     Ordered   01/05/22 1115  DME Oxygen  Once       Question Answer Comment  Length of Need Lifetime   Mode or (Route) Nasal cannula   Liters per Minute 2   Oxygen delivery system Gas      01/05/22 1114            Follow-up Information     [name], MD Follow up in 1 week(s).   Specialties: Internal Medicine, Cardiology Contact information: [address] [place] [address] [phone]                Allergies  Allergen Reactions   Sulfa Antibiotics Rash    The results of significant diagnostics from this hospitalization (including imaging, microbiology, ancillary and laboratory) are listed below for reference.    Microbiology: No results found for this or any previous visit (from the past 240 hour(s)).  Procedures/Studies: CT ABDOMEN PELVIS WO CONTRAST  Result Date: 01/01/2022 CLINICAL DATA:  Acute, non  localized abdominal pain. EXAM: CT ABDOMEN AND PELVIS WITHOUT CONTRAST TECHNIQUE: Multidetector CT imaging of the abdomen and pelvis was performed following the standard protocol without IV contrast. RADIATION DOSE REDUCTION: This exam was performed according to the departmental dose-optimization program which includes automated exposure control, adjustment of the mA and/or kV according to patient size and/or use of iterative reconstruction technique. COMPARISON:  12/24/2021 FINDINGS: Lower chest: Interval small bilateral pleural effusions and bilateral dependent atelectasis, right greater than left. Hepatobiliary: No focal liver abnormality is seen. No gallstones, gallbladder wall thickening, or biliary dilatation. Pancreas: Moderate diffuse pancreatic atrophy. Spleen: Normal in size without focal abnormality. Adrenals/Urinary Tract: Normal appearing adrenal glands. Stable bilateral renal atrophy with small bilateral cysts, including a proteinaceous or hemorrhagic cyst on the left. Multiple small right renal calculi are again demonstrated. Foley catheter in the urinary bladder with a small amount of urine in the bladder. Small amount of dependent calcification in the posterior urinary bladder on the right with improvement. No ureteral calculi or hydronephrosis seen. Moderate diffuse bladder wall thickening, accentuated by the interval decrease in bladder volume. Stomach/Bowel: Small number of sigmoid colon diverticula without evidence of diverticulitis. Unremarkable stomach and small bowel. The appendix is not identified and there are no findings suspicious for appendicitis. Vascular/Lymphatic: Atheromatous arterial calcifications without aneurysm. No enlarged lymph nodes. Reproductive: Mild to moderate prostatic hypertrophy. Other: Interval left rectus sheath hematoma measuring 7.5 x 6.2 cm on image number 73/3 and 17.6 cm in length on sagittal image number 79/7. Smaller hematomas in the left psoas muscle and  right iliacus muscle. Large right inguinal hernia containing multiple small bowel loops without obstruction. Small left inguinal hernia containing fat. Musculoskeletal: Lumbar and lower thoracic spine degenerative changes. IMPRESSION: 1. Interval 17.6 x 7.5 x 6.2 cm left rectus sheath hematoma. 2. Interval smaller left psoas muscle and right iliacus muscle hematomas. 3. Interval small bilateral pleural effusions and mild bilateral lower lobe atelectasis. 4. Partial clearing of previously demonstrated small bladder calculi. 5. Stable multiple small, nonobstructing right renal calculi. 6. Stable large right inguinal hernia containing multiple small bowel loops without obstruction. 7. Mild sigmoid colon diverticulosis. 8. Stable bilateral renal atrophy  and pancreatic atrophy. These results will be called to the ordering clinician or representative by the Radiologist Assistant, and communication documented in the PACS or [employer]. Electronically Signed   By: Claudie Revering M.D.   On: 01/01/2022 16:15   DG Chest 1 View  Result Date: 12/26/2021 CLINICAL DATA:  Hypoxia. EXAM: CHEST  1 VIEW COMPARISON:  December 24, 2021 FINDINGS: The study is limited secondary to patient rotation. Multiple sternal wires are seen. Decreased lung volumes are noted with subsequent crowding of the bronchovascular lung markings. Mild right basilar atelectasis is suspected. There is no evidence of a pleural effusion or pneumothorax. The cardiac silhouette is mildly enlarged and unchanged in size. The visualized skeletal structures are unremarkable. IMPRESSION: 1. Limited study secondary to patient rotation. 2. Stable cardiomegaly with mild right basilar atelectasis. Electronically Signed   By: Virgina Norfolk M.D.   On: 12/26/2021 03:03   CT HEAD WO CONTRAST (5MM)  Result Date: 12/24/2021 CLINICAL DATA:  Mental status change, unknown cause; Neck trauma (Age >= 65y). Pt c/o increased weakness, pt had 2 falls today. Febrile and  tachycardic. Unknown if hit head EXAM: CT HEAD WITHOUT CONTRAST CT CERVICAL SPINE WITHOUT CONTRAST TECHNIQUE: Multidetector CT imaging of the head and cervical spine was performed following the standard protocol without intravenous contrast. Multiplanar CT image reconstructions of the cervical spine were also generated. RADIATION DOSE REDUCTION: This exam was performed according to the departmental dose-optimization program which includes automated exposure control, adjustment of the mA and/or kV according to patient size and/or use of iterative reconstruction technique. COMPARISON:  None. FINDINGS: CT HEAD FINDINGS BRAIN: BRAIN Cerebral ventricle sizes are concordant with the degree of cerebral volume loss. Patchy and confluent areas of decreased attenuation are noted throughout the deep and periventricular white matter of the cerebral hemispheres bilaterally, compatible with chronic microvascular ischemic disease. No evidence of large-territorial acute infarction. No parenchymal hemorrhage. No mass lesion. No extra-axial collection. No mass effect or midline shift. No hydrocephalus. Basilar cisterns are patent. Vascular: No hyperdense vessel. Atherosclerotic calcifications are present within the cavernous internal carotid arteries. Skull: No acute fracture or focal lesion. Sinuses/Orbits: Paranasal sinuses and mastoid air cells are clear. The orbits are unremarkable. Other: None. CT CERVICAL SPINE FINDINGS Alignment: Likely exaggerated cervical lordosis likely due to positioning. Alignment is otherwise normal. Skull base and vertebrae: Multilevel at least moderate degenerative changes of the spine. Associated left severe osseous neural foraminal stenosis at the C3-C4 and C4-C5 levels. No severe osseous central canal stenosis. No acute fracture. No aggressive appearing focal osseous lesion or focal pathologic process. Soft tissues and spinal canal: No prevertebral fluid or swelling. No visible canal hematoma. Upper  chest: Unremarkable. Other: None. IMPRESSION: 1. No acute intracranial abnormality. 2. No acute displaced fracture or traumatic listhesis of the cervical spine. 3. Multilevel at least moderate degenerative changes of the spine. Associated left severe osseous neural foraminal stenosis at the C3-C4 and C4-C5 levels. Electronically Signed   By: Iven Finn M.D.   On: 12/24/2021 20:58   CT Cervical Spine Wo Contrast  Result Date: 12/24/2021 CLINICAL DATA:  Mental status change, unknown cause; Neck trauma (Age >= 65y). Pt c/o increased weakness, pt had 2 falls today. Febrile and tachycardic. Unknown if hit head EXAM: CT HEAD WITHOUT CONTRAST CT CERVICAL SPINE WITHOUT CONTRAST TECHNIQUE: Multidetector CT imaging of the head and cervical spine was performed following the standard protocol without intravenous contrast. Multiplanar CT image reconstructions of the cervical spine were also generated. RADIATION DOSE REDUCTION:  This exam was performed according to the departmental dose-optimization program which includes automated exposure control, adjustment of the mA and/or kV according to patient size and/or use of iterative reconstruction technique. COMPARISON:  None. FINDINGS: CT HEAD FINDINGS BRAIN: BRAIN Cerebral ventricle sizes are concordant with the degree of cerebral volume loss. Patchy and confluent areas of decreased attenuation are noted throughout the deep and periventricular white matter of the cerebral hemispheres bilaterally, compatible with chronic microvascular ischemic disease. No evidence of large-territorial acute infarction. No parenchymal hemorrhage. No mass lesion. No extra-axial collection. No mass effect or midline shift. No hydrocephalus. Basilar cisterns are patent. Vascular: No hyperdense vessel. Atherosclerotic calcifications are present within the cavernous internal carotid arteries. Skull: No acute fracture or focal lesion. Sinuses/Orbits: Paranasal sinuses and mastoid air cells are  clear. The orbits are unremarkable. Other: None. CT CERVICAL SPINE FINDINGS Alignment: Likely exaggerated cervical lordosis likely due to positioning. Alignment is otherwise normal. Skull base and vertebrae: Multilevel at least moderate degenerative changes of the spine. Associated left severe osseous neural foraminal stenosis at the C3-C4 and C4-C5 levels. No severe osseous central canal stenosis. No acute fracture. No aggressive appearing focal osseous lesion or focal pathologic process. Soft tissues and spinal canal: No prevertebral fluid or swelling. No visible canal hematoma. Upper chest: Unremarkable. Other: None. IMPRESSION: 1. No acute intracranial abnormality. 2. No acute displaced fracture or traumatic listhesis of the cervical spine. 3. Multilevel at least moderate degenerative changes of the spine. Associated left severe osseous neural foraminal stenosis at the C3-C4 and C4-C5 levels. Electronically Signed   By: Iven Finn M.D.   On: 12/24/2021 20:58   DG Chest Port 1 View  Result Date: 12/24/2021 CLINICAL DATA:  Sepsis EXAM: PORTABLE CHEST 1 VIEW COMPARISON:  08/07/2019 FINDINGS: Mild cardiomegaly. No focal airspace consolidation or pulmonary edema. Normal pleural spaces. IMPRESSION: Mild cardiomegaly without focal airspace disease. Electronically Signed   By: Ulyses Jarred M.D.   On: 12/24/2021 20:02   DG Knee Complete 4 Views Left  Result Date: 12/24/2021 CLINICAL DATA:  Fall EXAM: LEFT KNEE - COMPLETE 4+ VIEW COMPARISON:  None. FINDINGS: No evidence of fracture, dislocation, or joint effusion. No evidence of arthropathy or other focal bone abnormality. Soft tissues are unremarkable. IMPRESSION: Negative. Electronically Signed   By: Ulyses Jarred M.D.   On: 12/24/2021 20:02   DG Knee Complete 4 Views Right  Result Date: 12/24/2021 CLINICAL DATA:  Fall EXAM: RIGHT KNEE - COMPLETE 4+ VIEW COMPARISON:  None. FINDINGS: No evidence of fracture, dislocation, or joint effusion. No evidence  of arthropathy or other focal bone abnormality. Soft tissues are unremarkable. IMPRESSION: Negative. Electronically Signed   By: Ulyses Jarred M.D.   On: 12/24/2021 20:02   ECHOCARDIOGRAM COMPLETE  Result Date: 12/27/2021    ECHOCARDIOGRAM REPORT   Patient Name:   JAILYN LANGHORST Date of Exam: 12/27/2021 Medical Rec #:  [id number]        Height:       72.0 in Accession #:    [id number]       Weight:       138.9 lb Date of Birth:  02/20/1924        BSA:          1.825 m Patient Age:    88 years         BP:           125/55 mmHg Patient Gender: M  HR:           78 bpm. Exam Location:  [facility] Procedure: 2D Echo, Color Doppler and Cardiac Doppler Indications:     I51.7 Cardiomegaly  History:         Patient has prior history of Echocardiogram examinations. CAD,                  Prior CABG, CKD; Risk Factors:Hypertension, Diabetes and                  Dyslipidemia.  Sonographer:     Charmayne Sheer Referring Phys:  [id number] Claiborne Billings A GRIFFITH Diagnosing Phys: Serafina Royals MD  Sonographer Comments: Suboptimal subcostal window. IMPRESSIONS  1. Left ventricular ejection fraction, by estimation, is 60 to 65%. The left ventricle has normal function. The left ventricle has no regional wall motion abnormalities. There is mild left ventricular hypertrophy. Left ventricular diastolic parameters are consistent with Grade I diastolic dysfunction (impaired relaxation).  2. Right ventricular systolic function is normal. The right ventricular size is normal.  3. The mitral valve is normal in structure. Mild mitral valve regurgitation.  4. The aortic valve is calcified. Aortic valve regurgitation is trivial. Moderate aortic valve stenosis. FINDINGS  Left Ventricle: Left ventricular ejection fraction, by estimation, is 60 to 65%. The left ventricle has normal function. The left ventricle has no regional wall motion abnormalities. The left ventricular internal cavity size was normal in size. There is  mild left ventricular  hypertrophy. Left ventricular diastolic parameters are consistent with Grade I diastolic dysfunction (impaired relaxation). Right Ventricle: The right ventricular size is normal. No increase in right ventricular wall thickness. Right ventricular systolic function is normal. Left Atrium: Left atrial size was normal in size. Right Atrium: Right atrial size was normal in size. Pericardium: There is no evidence of pericardial effusion. Mitral Valve: The mitral valve is normal in structure. Mild mitral valve regurgitation. MV peak gradient, 5.9 mmHg. The mean mitral valve gradient is 2.0 mmHg. Tricuspid Valve: The tricuspid valve is normal in structure. Tricuspid valve regurgitation is trivial. Aortic Valve: The aortic valve is calcified. Aortic valve regurgitation is trivial. Moderate aortic stenosis is present. Aortic valve mean gradient measures 23.0 mmHg. Aortic valve peak gradient measures 42.8 mmHg. Aortic valve area, by VTI measures 0.80  cm. Pulmonic Valve: The pulmonic valve was normal in structure. Pulmonic valve regurgitation is not visualized. Aorta: The aortic root and ascending aorta are structurally normal, with no evidence of dilitation. IAS/Shunts: No atrial level shunt detected by color flow Doppler.  LEFT VENTRICLE PLAX 2D LVIDd:         4.40 cm   Diastology LVIDs:         3.01 cm   LV e' medial:    3.92 cm/s LV PW:         0.85 cm   LV E/e' medial:  14.1 LV IVS:        1.00 cm   LV e' lateral:   5.87 cm/s LVOT diam:     2.00 cm   LV E/e' lateral: 9.4 LV SV:         58 LV SV Index:   32 LVOT Area:     3.14 cm  RIGHT VENTRICLE RV Basal diam:  3.92 cm RV S prime:     13.50 cm/s LEFT ATRIUM             Index        RIGHT ATRIUM  Index LA diam:        3.60 cm 1.97 cm/m   RA Area:     14.70 cm LA Vol (A2C):   44.3 ml 24.28 ml/m  RA Volume:   37.90 ml  20.77 ml/m LA Vol (A4C):   50.3 ml 27.57 ml/m LA Biplane Vol: 48.1 ml 26.36 ml/m  AORTIC VALVE                     PULMONIC VALVE AV Area  (Vmax):    0.80 cm      PV Vmax:       0.88 m/s AV Area (Vmean):   0.75 cm      PV Vmean:      62.100 cm/s AV Area (VTI):     0.80 cm      PV VTI:        0.175 m AV Vmax:           327.00 cm/s   PV Peak grad:  3.1 mmHg AV Vmean:          226.000 cm/s  PV Mean grad:  2.0 mmHg AV VTI:            0.724 m AV Peak Grad:      42.8 mmHg AV Mean Grad:      23.0 mmHg LVOT Vmax:         82.90 cm/s LVOT Vmean:        54.000 cm/s LVOT VTI:          0.185 m LVOT/AV VTI ratio: 0.26  AORTA Ao Root diam: 3.10 cm MITRAL VALVE MV Area (PHT): 6.83 cm     SHUNTS MV Area VTI:   2.47 cm     Systemic VTI:  0.18 m MV Peak grad:  5.9 mmHg     Systemic Diam: 2.00 cm MV Mean grad:  2.0 mmHg MV Vmax:       1.21 m/s MV Vmean:      74.2 cm/s MV Decel Time: 111 msec MV E velocity: 55.30 cm/s MV A velocity: 110.00 cm/s MV E/A ratio:  0.50 Serafina Royals MD Electronically signed by Serafina Royals MD Signature Date/Time: 12/27/2021/12:38:09 PM    Final    CT CHEST ABDOMEN PELVIS WO CONTRAST  Result Date: 12/24/2021 CLINICAL DATA:  Multiple recent falls. EXAM: CT CHEST, ABDOMEN AND PELVIS WITHOUT CONTRAST TECHNIQUE: Multidetector CT imaging of the chest, abdomen and pelvis was performed following the standard protocol without IV contrast. RADIATION DOSE REDUCTION: This exam was performed according to the departmental dose-optimization program which includes automated exposure control, adjustment of the mA and/or kV according to patient size and/or use of iterative reconstruction technique. COMPARISON:  August 07, 2019 FINDINGS: CT CHEST FINDINGS Cardiovascular: There is marked severity calcification of the aortic arch, without evidence of aortic aneurysm. Normal heart size with marked severity coronary artery calcification. No pericardial effusion. Mediastinum/Nodes: No enlarged mediastinal, hilar, or axillary lymph nodes. Thyroid gland, trachea, and esophagus demonstrate no significant findings. Lungs/Pleura: There is no evidence of acute  infiltrate, pleural effusion or pneumothorax. Musculoskeletal: Multiple sternal wires are seen. A posterior 12 left rib fracture of indeterminate age is seen. Multilevel degenerative changes seen throughout the thoracic spine. CT ABDOMEN PELVIS FINDINGS Hepatobiliary: No focal liver abnormality is seen. No gallstones, gallbladder wall thickening, or biliary dilatation. Pancreas: Unremarkable. No pancreatic ductal dilatation or surrounding inflammatory changes. Spleen: Normal in size without focal abnormality. Adrenals/Urinary Tract: Adrenal glands are unremarkable. Stable diffuse renal cortical  thinning is seen with a stable 1.8 cm diameter cyst noted within the mid right kidney. A 0.4 cm round hyperdense focus is also seen within the mid to lower right kidney. This is not clearly identified on the prior study (axial CT image 67, CT series 2). Subcentimeter parenchymal calcifications are seen on the right. A stable 1.2 cm exophytic hyperdense lesion is seen along the lateral aspect of the mid left kidney (axial CT image 55, CT series 2). There is marked severity right-sided hydronephrosis which is mildly decreased in severity when compared to the prior study. A mild amount of air is also seen within the right renal pelvis and anterior aspect of and intrarenal calyx (axial CT images 58 through 67, CT series 2). This represents a new finding. Persistent marked severity left-sided hydronephrosis is seen. A Foley catheter is seen within a mildly distended urinary bladder. A mild amount of air is seen within the bladder lumen with a punctate focus of air noted within the lateral wall of the urinary bladder on the right (axial CT image 103, CT series 2). Mildly lobulated, asymmetric bladder wall thickening is seen on the left (axial CT images 102 through 104, CT series 2). Urinary bladder diverticula are seen within this region on the prior study. A thin layer of increased attenuation (approximately 81.42 Hounsfield  units) is noted within the dependent portion of the urinary bladder on the right. This is a new finding when compared to the prior exam. Stomach/Bowel: There is a small hiatal hernia. The appendix is surgically absent. No evidence of bowel dilatation. Noninflamed diverticula are seen throughout the sigmoid colon. Vascular/Lymphatic: Aortic atherosclerosis. No enlarged abdominal or pelvic lymph nodes. Reproductive: There is mild to moderate severity prostate gland enlargement. Other: A predominantly stable 5.6 cm x 3.9 cm right inguinal hernia is seen. This extends into the scrotum on the right where it reaches a maximum size of 7.1 cm x 6.5 cm. This area of herniation contains fat and multiple nondilated loops of small bowel. No abdominopelvic ascites. Musculoskeletal: Multilevel degenerative changes seen throughout the lumbar spine. IMPRESSION: 1. Posterior 12 left rib fracture of indeterminate age. Correlation with point tenderness is recommended. 2. Bilateral hydronephrosis, mildly decreased in severity when compared to the prior study, with a mild to moderate amount of air seen within the right renal collecting system. 3. Mildly lobulated, asymmetric bladder wall thickening on the left with associated bladder diverticula. The presence of an underlying neoplastic process cannot be excluded. Correlation with cystoscopy is recommended. 4. Increased attenuation within the urinary bladder which may represent a thin layer of urinary bladder calculi. 5. Findings which may represent small bilateral hemorrhagic renal cysts. Correlation with nonemergent renal ultrasound is recommended. 6. Small hiatal hernia. 7. Sigmoid diverticulosis. 8. Large, predominant stable right inguinal hernia. 9. Prostatomegaly. Aortic Atherosclerosis (ICD10-I70.0). Electronically Signed   By: Virgina Norfolk M.D.   On: 12/24/2021 21:15    Labs: BNP (last 3 results) Recent Labs    12/26/21 0550  BNP 858.8*   Basic Metabolic  Panel: Recent Labs  Lab 01/01/22 0610 01/02/22 0625 01/03/22 0827 01/04/22 0629 01/05/22 0559  NA 131* 131* 130* 130* 127*  K 3.9 4.3 4.7 4.6 4.5  CL 100 99 98 97* 93*  CO2 25 24 24 26 25   GLUCOSE 54* 180* 207* 175* 187*  BUN 61* 50* 37* 31* 26*  CREATININE 2.11* 1.98* 1.89* 1.80* 1.72*  CALCIUM 7.8* 7.8* 8.1* 7.9* 8.3*   Liver Function Tests: Recent Labs  Lab 12/30/21 0624  AST 46*  ALT 30  ALKPHOS 54  BILITOT 0.5  PROT 5.5*  ALBUMIN 2.7*   No results for input(s): LIPASE, AMYLASE in the last 168 hours. No results for input(s): AMMONIA in the last 168 hours. CBC: Recent Labs  Lab 01/01/22 0610 01/01/22 0924 01/01/22 2048 01/02/22 0625 01/03/22 0827 01/04/22 0629 01/05/22 0559  WBC 11.5*   11.5* 14.9*  --  10.8* 14.7* 13.0* 14.8*  NEUTROABS 8.0* 10.4*  --  8.0*  --   --   --   HGB 4.9*   4.8* 5.5* 8.0* 7.8* 8.1* 7.4* 8.4*  HCT 14.2*   13.9* 16.0* 22.6* 22.1* 23.6* 22.3* 24.7*  MCV 78.9*   76.8* 78.4*  --  76.2* 79.2* 81.4 79.9*  PLT 126*   132* 152  --  126* 149* 151 191   Cardiac Enzymes: No results for input(s): CKTOTAL, CKMB, CKMBINDEX, TROPONINI in the last 168 hours. BNP: Invalid input(s): POCBNP CBG: Recent Labs  Lab 01/04/22 1612 01/04/22 1641 01/04/22 2027 01/05/22 0809 01/05/22 0858  GLUCAP 67* 97 244* 189* 193*   D-Dimer No results for input(s): DDIMER in the last 72 hours. Hgb A1c No results for input(s): HGBA1C in the last 72 hours. Lipid Profile No results for input(s): CHOL, HDL, LDLCALC, TRIG, CHOLHDL, LDLDIRECT in the last 72 hours. Thyroid function studies No results for input(s): TSH, T4TOTAL, T3FREE, THYROIDAB in the last 72 hours.  Invalid input(s): FREET3 Anemia work up No results for input(s): VITAMINB12, FOLATE, FERRITIN, TIBC, IRON, RETICCTPCT in the last 72 hours. Urinalysis    Component Value Date/Time   COLORURINE YELLOW 12/24/2021 2115   APPEARANCEUR CLOUDY (A) 12/24/2021 2115   LABSPEC 1.020 12/24/2021 2115    PHURINE 7.0 12/24/2021 2115   GLUCOSEU 250 (A) 12/24/2021 2115   HGBUR LARGE (A) 12/24/2021 2115   BILIRUBINUR NEGATIVE 12/24/2021 2115   KETONESUR NEGATIVE 12/24/2021 2115   PROTEINUR 100 (A) 12/24/2021 2115   NITRITE NEGATIVE 12/24/2021 2115   LEUKOCYTESUR SMALL (A) 12/24/2021 2115   Sepsis Labs Invalid input(s): PROCALCITONIN,  WBC,  LACTICIDVEN Microbiology No results found for this or any previous visit (from the past 240 hour(s)).   Time coordinating discharge: 35 minutes  SIGNED: Antonieta Pert, MD  [facility] 01/05/2022, 11:16 AM  If 7PM-7AM, please contact night-coverage [email or web address]

## 2022-01-05 NOTE — TOC Progression Note (Addendum)
Transition of Care [facility]) - Progression Note    Patient Details  Name: MARTAVIOUS HARTEL MRN: [id number] Date of Birth: [birth year]  [facility] CM/SW [place], [place] Phone Number: 01/05/2022, 9:43 AM  Clinical Narrative:     CSW spoke with patient's son [name] who reports they have now decided to take patient home with hospice, with no preference of hospice agency. They will need a hospital bed.   CSW spoke with Glennis Brink with [facility], she will follow up with son [name] on referral and let CSW know when everything's set up.     Expected Discharge Plan:  (TBD) Barriers to Discharge: Continued Medical Work up  Expected Discharge Plan and Services Expected Discharge Plan:  (TBD)   Discharge Planning Services: CM Consult Post Acute Care Choice: NA Living arrangements for the past 2 months: Single Family Home                                       Social Determinants of Health (SDOH) Interventions    Readmission Risk Interventions No flowsheet data found.

## 2022-01-05 NOTE — Care Management Important Message (Signed)
Important Message  Patient Details  Name: Jay Mathis MRN: [id number] Date of Birth: 01-Dec-1924   Medicare Important Message Given:  Other (see comment)  Patient discharging home with hospice.  Medicare IM withheld at this time out of respect for patient and family.   Dannette Barbara 01/05/2022, 1:04 PM

## 2022-02-14 ENCOUNTER — Encounter: Payer: Self-pay | Admitting: Oncology

## 2022-02-21 ENCOUNTER — Ambulatory Visit: Payer: Medicare HMO | Admitting: Urology

## 2022-02-22 ENCOUNTER — Ambulatory Visit: Payer: Self-pay | Admitting: Urology

## 2022-04-09 ENCOUNTER — Telehealth: Payer: Self-pay | Admitting: Cardiology

## 2022-04-09 NOTE — Telephone Encounter (Signed)
Noted  

## 2022-04-09 NOTE — Telephone Encounter (Signed)
Have attempted to schedule follow up with no success ?Recall deleted ?
